# Patient Record
Sex: Female | Born: 1935 | Race: White | Hispanic: No | Smoking: Never smoker
Health system: Southern US, Community
[De-identification: ages and names within clinical notes are randomized; demographics above are authoritative.]

## PROBLEM LIST (undated history)

## (undated) DIAGNOSIS — I10 Essential (primary) hypertension: Secondary | ICD-10-CM

## (undated) DIAGNOSIS — H409 Unspecified glaucoma: Secondary | ICD-10-CM

## (undated) HISTORY — PX: CATARACT EXTRACTION: SUR2

## (undated) HISTORY — PX: APPENDECTOMY: SHX54

## (undated) HISTORY — PX: ESOPHAGOGASTRODUODENOSCOPY (EGD) WITH ESOPHAGEAL DILATION: SHX5812

---

## 2003-11-26 ENCOUNTER — Ambulatory Visit: Payer: Self-pay | Admitting: Family Medicine

## 2004-04-17 ENCOUNTER — Ambulatory Visit: Payer: Self-pay | Admitting: Family Medicine

## 2004-08-31 ENCOUNTER — Ambulatory Visit: Payer: Self-pay | Admitting: Family Medicine

## 2004-11-06 ENCOUNTER — Ambulatory Visit: Payer: Self-pay | Admitting: Family Medicine

## 2004-11-11 ENCOUNTER — Ambulatory Visit: Payer: Self-pay | Admitting: Family Medicine

## 2005-04-13 ENCOUNTER — Ambulatory Visit: Payer: Self-pay | Admitting: Family Medicine

## 2012-04-21 ENCOUNTER — Encounter (HOSPITAL_COMMUNITY): Payer: Self-pay | Admitting: General Practice

## 2012-04-21 ENCOUNTER — Other Ambulatory Visit: Payer: Self-pay

## 2012-04-21 ENCOUNTER — Emergency Department (HOSPITAL_COMMUNITY): Payer: Medicare Other

## 2012-04-21 ENCOUNTER — Inpatient Hospital Stay (HOSPITAL_COMMUNITY): Payer: Medicare Other

## 2012-04-21 ENCOUNTER — Inpatient Hospital Stay (HOSPITAL_COMMUNITY)
Admission: EM | Admit: 2012-04-21 | Discharge: 2012-04-25 | DRG: 065 | Disposition: A | Discharge status: Rehabilitation Facility | Payer: Medicare Other | Attending: Internal Medicine | Admitting: Internal Medicine

## 2012-04-21 DIAGNOSIS — I359 Nonrheumatic aortic valve disorder, unspecified: Secondary | ICD-10-CM

## 2012-04-21 DIAGNOSIS — I4892 Unspecified atrial flutter: Secondary | ICD-10-CM | POA: Diagnosis not present

## 2012-04-21 DIAGNOSIS — I4891 Unspecified atrial fibrillation: Secondary | ICD-10-CM | POA: Diagnosis present

## 2012-04-21 DIAGNOSIS — I6529 Occlusion and stenosis of unspecified carotid artery: Secondary | ICD-10-CM | POA: Diagnosis present

## 2012-04-21 DIAGNOSIS — I671 Cerebral aneurysm, nonruptured: Secondary | ICD-10-CM | POA: Diagnosis present

## 2012-04-21 DIAGNOSIS — R29898 Other symptoms and signs involving the musculoskeletal system: Secondary | ICD-10-CM | POA: Diagnosis present

## 2012-04-21 DIAGNOSIS — I6340 Cerebral infarction due to embolism of unspecified cerebral artery: Principal | ICD-10-CM | POA: Diagnosis present

## 2012-04-21 DIAGNOSIS — H409 Unspecified glaucoma: Secondary | ICD-10-CM | POA: Diagnosis present

## 2012-04-21 DIAGNOSIS — I10 Essential (primary) hypertension: Secondary | ICD-10-CM | POA: Diagnosis present

## 2012-04-21 DIAGNOSIS — I639 Cerebral infarction, unspecified: Secondary | ICD-10-CM

## 2012-04-21 DIAGNOSIS — Z79899 Other long term (current) drug therapy: Secondary | ICD-10-CM

## 2012-04-21 DIAGNOSIS — Z66 Do not resuscitate: Secondary | ICD-10-CM | POA: Diagnosis present

## 2012-04-21 HISTORY — DX: Essential (primary) hypertension: I10

## 2012-04-21 HISTORY — DX: Unspecified glaucoma: H40.9

## 2012-04-21 LAB — CREATININE, SERUM: Creatinine, Ser: 1.03 mg/dL (ref 0.50–1.10)

## 2012-04-21 LAB — POCT I-STAT, CHEM 8
BUN: 22 mg/dL (ref 6–23)
Calcium, Ion: 1.27 mmol/L (ref 1.13–1.30)
Chloride: 100 mEq/L (ref 96–112)
Glucose, Bld: 105 mg/dL — ABNORMAL HIGH (ref 70–99)

## 2012-04-21 LAB — APTT: aPTT: 29 seconds (ref 24–37)

## 2012-04-21 LAB — URINALYSIS, ROUTINE W REFLEX MICROSCOPIC
Glucose, UA: NEGATIVE mg/dL
Hgb urine dipstick: NEGATIVE
Ketones, ur: NEGATIVE mg/dL
Protein, ur: NEGATIVE mg/dL

## 2012-04-21 LAB — COMPREHENSIVE METABOLIC PANEL
ALT: 11 U/L (ref 0–35)
AST: 21 U/L (ref 0–37)
Alkaline Phosphatase: 61 U/L (ref 39–117)
CO2: 26 mEq/L (ref 19–32)
Chloride: 99 mEq/L (ref 96–112)
GFR calc non Af Amer: 44 mL/min — ABNORMAL LOW (ref >90)
Potassium: 3.7 mEq/L (ref 3.5–5.1)
Sodium: 137 mEq/L (ref 135–145)
Total Bilirubin: 0.3 mg/dL (ref 0.3–1.2)

## 2012-04-21 LAB — CBC
HCT: 38.6 % (ref 36.0–46.0)
HCT: 37 % (ref 36.0–46.0)
Hemoglobin: 12.2 g/dL (ref 12.0–15.0)
MCH: 26.9 pg (ref 26.0–34.0)
MCV: 81.7 fL (ref 78.0–100.0)
Platelets: 260 10*3/uL (ref 150–400)
RBC: 4.53 MIL/uL (ref 3.87–5.11)
RDW: 14.2 % (ref 11.5–15.5)
WBC: 6 10*3/uL (ref 4.0–10.5)

## 2012-04-21 LAB — POCT I-STAT TROPONIN I: Troponin i, poc: 0 ng/mL (ref 0.00–0.08)

## 2012-04-21 LAB — DIFFERENTIAL
Basophils Absolute: 0 10*3/uL (ref 0.0–0.1)
Lymphocytes Relative: 17 % (ref 12–46)
Monocytes Absolute: 0.4 10*3/uL (ref 0.1–1.0)
Neutro Abs: 4.3 10*3/uL (ref 1.7–7.7)

## 2012-04-21 LAB — GLUCOSE, CAPILLARY: Glucose-Capillary: 104 mg/dL — ABNORMAL HIGH (ref 70–99)

## 2012-04-21 MED ORDER — LATANOPROST 0.005 % OP SOLN
1.0000 [drp] | Freq: Every day | OPHTHALMIC | Status: DC
  Administered 2012-04-21 – 2012-04-24 (×4): 1 [drp] via OPHTHALMIC
  Filled 2012-04-21: qty 2.5

## 2012-04-21 MED ORDER — DORZOLAMIDE HCL-TIMOLOL MAL 2-0.5 % OP SOLN
1.0000 [drp] | Freq: Two times a day (BID) | OPHTHALMIC | Status: DC
  Administered 2012-04-21 – 2012-04-24 (×6): 1 [drp] via OPHTHALMIC
  Filled 2012-04-21: qty 10

## 2012-04-21 MED ORDER — SENNOSIDES-DOCUSATE SODIUM 8.6-50 MG PO TABS: 1.0000 | ORAL_TABLET | Freq: Every evening | ORAL | Status: DC | PRN

## 2012-04-21 MED ORDER — HYDROCHLOROTHIAZIDE 12.5 MG PO CAPS
12.5000 mg | ORAL_CAPSULE | Freq: Every day | ORAL | Status: DC
  Filled 2012-04-21: qty 1

## 2012-04-21 MED ORDER — CLOPIDOGREL BISULFATE 75 MG PO TABS
75.0000 mg | ORAL_TABLET | Freq: Every day | ORAL | Status: DC
  Administered 2012-04-22 – 2012-04-25 (×4): 75 mg via ORAL
  Filled 2012-04-21 (×5): qty 1

## 2012-04-21 MED ORDER — SIMVASTATIN 20 MG PO TABS
20.0000 mg | ORAL_TABLET | Freq: Every day | ORAL | Status: DC
  Administered 2012-04-21 – 2012-04-24 (×4): 20 mg via ORAL
  Filled 2012-04-21 (×5): qty 1

## 2012-04-21 MED ORDER — HYDRALAZINE HCL 20 MG/ML IJ SOLN: 5.0000 mg | INTRAMUSCULAR | Status: DC | PRN

## 2012-04-21 MED ORDER — VALSARTAN-HYDROCHLOROTHIAZIDE 320-12.5 MG PO TABS: 1.0000 | ORAL_TABLET | Freq: Every day | ORAL | Status: DC

## 2012-04-21 MED ORDER — IRBESARTAN 300 MG PO TABS
300.0000 mg | ORAL_TABLET | Freq: Every day | ORAL | Status: DC
Start: 2012-04-21 — End: 2012-04-25
  Administered 2012-04-21 – 2012-04-25 (×5): 300 mg via ORAL
  Filled 2012-04-21 (×5): qty 1

## 2012-04-21 MED ORDER — IRBESARTAN 300 MG PO TABS
300.0000 mg | ORAL_TABLET | Freq: Every day | ORAL | Status: DC
  Filled 2012-04-21: qty 1

## 2012-04-21 MED ORDER — HYDROCHLOROTHIAZIDE 12.5 MG PO CAPS
12.5000 mg | ORAL_CAPSULE | Freq: Every day | ORAL | Status: DC
Start: 2012-04-21 — End: 2012-04-25
  Administered 2012-04-21 – 2012-04-25 (×5): 12.5 mg via ORAL
  Filled 2012-04-21 (×5): qty 1

## 2012-04-21 MED ORDER — HEPARIN SODIUM (PORCINE) 5000 UNIT/ML IJ SOLN
5000.0000 [IU] | Freq: Three times a day (TID) | INTRAMUSCULAR | Status: DC
  Administered 2012-04-21 – 2012-04-25 (×9): 5000 [IU] via SUBCUTANEOUS
  Filled 2012-04-21 (×15): qty 1

## 2012-04-21 MED ORDER — ADULT MULTIVITAMIN W/MINERALS CH
1.0000 | ORAL_TABLET | Freq: Every day | ORAL | Status: DC
  Administered 2012-04-21 – 2012-04-25 (×4): 1 via ORAL
  Filled 2012-04-21 (×5): qty 1

## 2012-04-21 NOTE — ED Notes (Signed)
EDP at the bedside.  ?

## 2012-04-21 NOTE — ED Notes (Signed)
Per EMS: From home alone, Left arm weakness/numbness. Started 3/26, slight pain and loss of dexterity. Today, went to turn on a light and was unable to lift left arm. No difficulty with speech or walking. Some hx hypertention 185/80, 96% RA, 80bpm, 16resp, CBG 151.

## 2012-04-21 NOTE — ED Notes (Signed)
Patient stated she was not able to urinate at this time. Asked to come back in a little bit.

## 2012-04-21 NOTE — ED Provider Notes (Signed)
History    CSN: [id number] Arrival date & time 04/21/12  4098 First MD Initiated Contact with Patient 04/21/12 [phone]     Chief Complaint  Patient presents with  . Weakness   HPI Pt noticed that her arm started to feel funny last week.  Since then she has felt numbness and weakness in her left arm.  Pt is unable to hold her arm up because it falls right back down.  This AM it became worse.  She had been able to use her arm until this am when she was unable to turn a light on. (Pt is right handed).  No trouble with speech.  No new extremity weakness.  No new headache although she often has a dull headache, (prior history of migraines).   Past Medical History  Diagnosis Date  . Hypertension   . Glaucoma     Past Surgical History  Procedure Laterality Date  . Appendectomy    . Cataract extraction      No family history on file.  History  Substance Use Topics  . Smoking status: Never Smoker   . Smokeless tobacco: Not on file  . Alcohol Use: No    OB History   Grav Para Term Preterm Abortions TAB SAB Ect Mult Living                  Review of Systems  All other systems reviewed and are negative.    Allergies  Penicillins and Sulfa antibiotics  Home Medications   Current Outpatient Rx  Name  Route  Sig  Dispense  Refill  . dorzolamide-timolol (COSOPT) 22.3-6.8 MG/ML ophthalmic solution   Both Eyes   Place 1 drop into both eyes 2 (two) times daily.         Marland Kitchen latanoprost (XALATAN) 0.005 % ophthalmic solution   Both Eyes   Place 1 drop into both eyes at bedtime.         . Multiple Vitamin (MULTIVITAMIN WITH MINERALS) TABS   Oral   Take 1 tablet by mouth daily.         . valsartan-hydrochlorothiazide (DIOVAN-HCT) 320-12.5 MG per tablet   Oral   Take 1 tablet by mouth daily.           BP 155/62  Pulse 82  Temp(Src) 98 F (36.7 C) (Oral)  Resp 22  SpO2 99%  Physical Exam  Nursing note and vitals reviewed. Constitutional: She is oriented to person,  place, and time. She appears well-developed and well-nourished. No distress.  HENT:  Head: Normocephalic and atraumatic.  Right Ear: External ear normal.  Left Ear: External ear normal.  Mouth/Throat: Oropharynx is clear and moist.  Eyes: Conjunctivae are normal. Right eye exhibits no discharge. Left eye exhibits no discharge. No scleral icterus.  Neck: Neck supple. No tracheal deviation present.  Cardiovascular: Normal rate, regular rhythm and intact distal pulses.   Pulmonary/Chest: Effort normal and breath sounds normal. No stridor. No respiratory distress. She has no wheezes. She has no rales.  Abdominal: Soft. Bowel sounds are normal. She exhibits no distension. There is no tenderness. There is no rebound and no guarding.  Musculoskeletal: She exhibits no edema and no tenderness.  Neurological: She is alert and oriented to person, place, and time. She has normal strength. No sensory deficit. Cranial nerve deficit:  no gross defecits noted. She exhibits normal muscle tone. She displays no seizure activity. Coordination normal. GCS eye subscore is 4. GCS verbal subscore is 5. GCS motor subscore  is 6.  Unable to lift left arm and hold against gravity for more than 2 seconds, able to hold both legs off bed for 5 seconds (lthough trouble with right leg due to pain from arthritis, not weakness), sensation intact in all extremities, no visual field cuts, no left or right sided neglect  Skin: Skin is warm and dry. No rash noted.  Psychiatric: She has a normal mood and affect.    ED Course  Procedures (including critical care time) EKG Normal sinus rhythm rate 84 Right bundle branch block No prior EKG for comparison  Labs Reviewed  COMPREHENSIVE METABOLIC PANEL - Abnormal; Notable for the following:    Glucose, Bld 104 (*)    GFR calc non Af Amer 44 (*)    GFR calc Af Amer 51 (*)    All other components within normal limits  GLUCOSE, CAPILLARY - Abnormal; Notable for the following:     Glucose-Capillary 104 (*)    All other components within normal limits  POCT I-STAT, CHEM 8 - Abnormal; Notable for the following:    Glucose, Bld 105 (*)    All other components within normal limits  PROTIME-INR  APTT  CBC  DIFFERENTIAL  TROPONIN I  URINALYSIS, ROUTINE W REFLEX MICROSCOPIC  POCT I-STAT TROPONIN I   Ct Head Wo Contrast  04/21/2012  *RADIOLOGY REPORT*  Clinical Data: Left arm weakness and numbness with pain in the loss of dexterity, symptoms began 04/12/2012, today unable to lift left arm; history hypertension  CT HEAD WITHOUT CONTRAST  Technique:  Contiguous axial images were obtained from the base of the skull through the vertex without contrast.  Comparison: None  Findings: Generalized atrophy. Normal ventricular morphology. No midline shift or mass effect. Tiny sulcal calcifications right parietal, nonspecific. Question small old infarct versus volume averaging artifact at right parietal lobe. Otherwise normal appearance of brain parenchyma. No intracranial hemorrhage, mass lesion or evidence of acute infarction. No extra-axial fluid collections. Visualized paranasal sinuses and mastoid air cells clear. Extensive atherosclerotic calcifications of internal carotid arteries at skull base. No acute osseous findings.  IMPRESSION: Generalized atrophy. Question small old infarct versus volume averaging artifact at right parietal lobe. No acute intracranial abnormalities.   Original Report Authenticated By: Ulyses Southward, M.D.      1. Stroke       MDM  The patient's symptoms are concerning for a stroke affecting her left upper extremity.  She will be admitted to the hospital for further. Patient is not a code stroke/TPA candidate.  The onset of her symptoms started at least yesterday and may have even been going on for the last few days.        Celene Kras, MD 04/21/12 [phone]

## 2012-04-21 NOTE — Progress Notes (Signed)
Bilateral carotid artery duplex completed.  Right:  40-59% internal carotid artery stenosis.  Left:  No evidence of hemodynamically significant internal carotid artery stenosis.  Bilateral:  Vertebral artery flow is antegrade.

## 2012-04-21 NOTE — Progress Notes (Signed)
  Echocardiogram 2D Echocardiogram has been performed.  Sandra Larson 04/21/2012, 3:05 PM

## 2012-04-21 NOTE — H&P (Signed)
[facility] History and Physical  Sandra Larson ZOX:[id number] DOB: 1913-10-19 DOA: 04/21/2012  Referring physician: Dr. Lynelle Doctor PCP: Dina Rich, MD  Specialists: none  Chief Complaint: left arm weakness  HPI: Sandra Larson is a 79 y.o. female  With past medical history of hypertension, she relates she felt her arm felt weakn one week prior to admission progressively getting worse with numbness and dropping things. Patient unable to hold her arm up. She also relays she's been having trouble with buttoning her shirt. She relates no coughing with eating, no slurred speech no other extremity weakness, no bradykinesia no change in vision.  Review of Systems: The patient denies anorexia, fever, weight loss,, vision loss, decreased hearing, hoarseness, chest pain, syncope, dyspnea on exertion, peripheral edema, balance deficits, hemoptysis, abdominal pain, melena, hematochezia, severe indigestion/heartburn, hematuria, incontinence, genital sores, muscle weakness, suspicious skin lesions, transient blindness, difficulty walking, depression, unusual weight change, abnormal bleeding, enlarged lymph nodes, angioedema, and breast masses.    Past Medical History  Diagnosis Date  . Hypertension   . Glaucoma    Past Surgical History  Procedure Laterality Date  . Appendectomy    . Cataract extraction     Social History:  reports that she has never smoked. She does not have any smokeless tobacco history on file. She reports that she does not drink alcohol or use illicit drugs. Visit home alone can perform all her ADLs  Allergies  Allergen Reactions  . Penicillins     chilhood  . Sulfa Antibiotics Rash    Family History  Problem Relation Age of Onset  . Stroke Mother   . Stroke Father      Prior to Admission medications   Medication Sig Start Date End Date Taking? Authorizing Provider  dorzolamide-timolol (COSOPT) 22.3-6.8 MG/ML ophthalmic solution Place 1 drop into both eyes  2 (two) times daily.   Yes Historical Provider, MD  latanoprost (XALATAN) 0.005 % ophthalmic solution Place 1 drop into both eyes at bedtime.   Yes Historical Provider, MD  Multiple Vitamin (MULTIVITAMIN WITH MINERALS) TABS Take 1 tablet by mouth daily.   Yes Historical Provider, MD  valsartan-hydrochlorothiazide (DIOVAN-HCT) 320-12.5 MG per tablet Take 1 tablet by mouth daily.   Yes Historical Provider, MD   Physical Exam: Filed Vitals:   04/21/12 0928 04/21/12 0930 04/21/12 1015 04/21/12 1100  BP:  163/80 155/62 163/86  Pulse:  80 82 54  Temp: 98 F (36.7 C)     TempSrc:      Resp:  24 22 28   SpO2:  100% 99% 99%    BP 163/86  Pulse 54  Temp(Src) 98 F (36.7 C) (Oral)  Resp 28  SpO2 99%  General Appearance:    Alert, cooperative, no distress, appears stated age, extremely thin appearing   Head:    Normocephalic, without obvious abnormality, atraumatic  Eyes:    PERRL, conjunctiva/corneas clear, EOM's intact, fundi    benign, both eyes        Throat:   Lips, mucosa, and tongue normal; teeth and gums normal  Neck:   Supple, symmetrical, trachea midline, no adenopathy;    thyroid:  no enlargement/tenderness/nodules; no carotid   bruit or JVD  Back:     Symmetric, no curvature, ROM normal, no CVA tenderness  Lungs:     Clear to auscultation bilaterally, respirations unlabored  Chest Wall:    No tenderness or deformity   Heart:    Regular rate and rhythm, S1 and S2 normal, no murmur, rub  or gallop     Abdomen:     Soft, non-tender, bowel sounds active all four quadrants,    no masses, no organomegaly        Extremities:   Extremities normal, atraumatic, no cyanosis or edema  Pulses:   2+ and symmetric all extremities  Skin:   Skin color, texture, turgor normal, no rashes or lesions  Lymph nodes:   Cervical, supraclavicular, and axillary nodes normal  Neurologic:  3-12 are grossly intact, sensation is intact throughout except on her left upper extremity she relates  numbness in decrease in sensation to light touch. Her muscle strength in that arm is 3/5. Muscle strength in right upper extremity and bilateral lower extremities is 5/5. Deep tendon reflexes is 2+ finger-to-nose is normal     Labs on Admission:  Basic Metabolic Panel:  Recent Labs Lab 04/21/12 0852 04/21/12 0909  NA 137 140  K 3.7 3.8  CL 99 100  CO2 26  --   GLUCOSE 104* 105*  BUN 21 22  CREATININE 1.04 1.10  CALCIUM 9.7  --    Liver Function Tests:  Recent Labs Lab 04/21/12 0852  AST 21  ALT 11  ALKPHOS 61  BILITOT 0.3  PROT 7.2  ALBUMIN 3.9   No results found for this basename: LIPASE, AMYLASE,  in the last 168 hours No results found for this basename: AMMONIA,  in the last 168 hours CBC:  Recent Labs Lab 04/21/12 0852 04/21/12 0909  WBC 6.0  --   NEUTROABS 4.3  --   HGB 12.2 13.9  HCT 38.6 41.0  MCV 82.1  --   PLT 260  --    Cardiac Enzymes:  Recent Labs Lab 04/21/12 0852  TROPONINI <0.30    BNP (last 3 results) No results found for this basename: PROBNP,  in the last 8760 hours CBG:  Recent Labs Lab 04/21/12 0858  GLUCAP 104*    Radiological Exams on Admission: Ct Head Wo Contrast  04/21/2012  *RADIOLOGY REPORT*  Clinical Data: Left arm weakness and numbness with pain in the loss of dexterity, symptoms began 04/12/2012, today unable to lift left arm; history hypertension  CT HEAD WITHOUT CONTRAST  Technique:  Contiguous axial images were obtained from the base of the skull through the vertex without contrast.  Comparison: None  Findings: Generalized atrophy. Normal ventricular morphology. No midline shift or mass effect. Tiny sulcal calcifications right parietal, nonspecific. Question small old infarct versus volume averaging artifact at right parietal lobe. Otherwise normal appearance of brain parenchyma. No intracranial hemorrhage, mass lesion or evidence of acute infarction. No extra-axial fluid collections. Visualized paranasal sinuses and  mastoid air cells clear. Extensive atherosclerotic calcifications of internal carotid arteries at skull base. No acute osseous findings.  IMPRESSION: Generalized atrophy. Question small old infarct versus volume averaging artifact at right parietal lobe. No acute intracranial abnormalities.   Original Report Authenticated By: Ulyses Southward, M.D.     EKG: Independently reviewed. Sinus tachycardia with right bundle branch block  Assessment/Plan  CVA (cerebral infarction) - HgbA1c, fasting lipid panel  - MRI, MRA of the brain without contrast  - PT consult, OT consult, Speech consult  - Echocardiogram  - Carotid dopplers  - Prophylactic therapy-Antiplatelet med: I would start Plavix as she relates a severe allergy to aspirin. risk factor modification  - Cardiac Monitoring  - Neurochecks q4h  - I will also in the degree of permissive hypertension will hold on her blood pressure medications. Goal less than 180  over    HTN (hypertension): - Hold her Bp medications for today.  Neurology consult  Code Status: full Family Communication: son' s and daughter Disposition Plan: home 2-3 days.  Time spent: 60 minutes  Marinda Elk [facility] Pager [phone]  If 7PM-7AM, please contact night-coverage [email or web address] Password [facility] 04/21/2012, 12:18 PM

## 2012-04-22 DIAGNOSIS — I6350 Cerebral infarction due to unspecified occlusion or stenosis of unspecified cerebral artery: Secondary | ICD-10-CM

## 2012-04-22 DIAGNOSIS — I10 Essential (primary) hypertension: Secondary | ICD-10-CM

## 2012-04-22 LAB — LIPID PANEL
Cholesterol: 159 mg/dL (ref 0–200)
HDL: 46 mg/dL (ref >39)

## 2012-04-22 LAB — HEMOGLOBIN A1C
Hgb A1c MFr Bld: 5.8 % — ABNORMAL HIGH (ref <5.7)
Mean Plasma Glucose: 120 mg/dL — ABNORMAL HIGH (ref <117)

## 2012-04-22 MED ORDER — ALUM & MAG HYDROXIDE-SIMETH 200-200-20 MG/5ML PO SUSP
15.0000 mL | Freq: Four times a day (QID) | ORAL | Status: DC | PRN
  Administered 2012-04-23: 15 mL via ORAL
  Filled 2012-04-22: qty 30

## 2012-04-22 NOTE — Progress Notes (Signed)
OT NOTE  MD- Pt could benefit from splint on Lt wrist. Please order splint to be fabricated/ ordered by OT . (Ot will further assess the proper splint to apply)  Thank you   Lucile Shutters   OTR/L Pager: [phone] Office: [phone] .

## 2012-04-22 NOTE — Consult Note (Signed)
Reason for Consult: Strokes Referring Physician: Dr. Elvera Lennox  CC: Strokes  HPI: Sandra Larson is a pleasant 79 y.o. right-handed female who lives alone in [place].. There are multiple family members who live close by and checks on her frequently. She quit driving several years ago but otherwise has been very independent and continues to cook, clean house, and perform ADLs independently. She uses a rolling walker in her home for ambulation.  Approximately 7-10 days ago the patient noted some mild left upper extremity weakness. This gradually became worse and yesterday she went to turn on the bathroom light switch with her left hand and her arm collapsed into the sink. She presented to [facility] yesterday morning and a CT of the head showed generalized atrophy with a possible old small infarct in the right parietal lobe but no acute intracranial abnormalities. An MRI confirmed multifocal areas of acute cerebral infarction within the right middle cerebral artery territory. She was admitted for further evaluation and treatment. A neurology consult was requested.  The patient denied any weakness of the left leg. She denied any difficulties with speech or swallowing. She denied loss of sensation.  Past Medical History  Diagnosis Date  . Hypertension   . Glaucoma     Past Surgical History  Procedure Laterality Date  . Appendectomy    . Cataract extraction     Previous right patellar surgery.  Family History  Problem Relation Age of Onset  . Stroke Mother   . Stroke Father     Social History:  reports that she has never smoked. She does not have any smokeless tobacco history on file. She reports that she does not drink alcohol or use illicit drugs.  Allergies  Allergen Reactions  . Penicillins     chilhood  . Sulfa Antibiotics Rash    Medications:  Scheduled: . clopidogrel  75 mg Oral Q breakfast  . dorzolamide-timolol  1 drop Both Eyes BID  . heparin   5,000 Units Subcutaneous Q8H  . irbesartan  300 mg Oral Daily   And  . hydrochlorothiazide  12.5 mg Oral Daily  . latanoprost  1 drop Both Eyes QHS  . multivitamin with minerals  1 tablet Oral Daily  . simvastatin  20 mg Oral q1800    ROS: History obtained from the patient and family.  General ROS: negative for - chills, fatigue, fever, night sweats, or weight gain. She has had some slow gradual weight loss. She admits to having a decreased appetite. Psychological ROS: negative for - behavioral disorder, hallucinations, memory difficulties, mood swings or suicidal ideation Ophthalmic ROS: negative for - blurry vision, double vision, eye pain or loss of vision ENT ROS: negative for - epistaxis, nasal discharge, oral lesions, sore throat, or tinnitus. She has had episodes of dizziness which have been attributed to inner ear problems. Allergy and Immunology ROS: negative for - hives or itchy/watery eyes Hematological and Lymphatic ROS: negative for - bleeding problems, bruising or swollen lymph nodes Endocrine ROS: negative for - galactorrhea, hair pattern changes, polydipsia/polyuria or temperature intolerance Respiratory ROS: negative for - cough, hemoptysis, or wheezing. She has occasional shortness of breath related to activity. Cardiovascular ROS: negative for - chest pain, dyspnea on exertion, edema or irregular heartbeat Gastrointestinal ROS: negative for - abdominal pain, diarrhea, hematemesis, nausea/vomiting or stool incontinence. She has occasional indigestion as well as occasional constipation.. Genito-Urinary ROS: negative for - dysuria, hematuria, incontinence or urinary frequency/urgency Musculoskeletal ROS: negative for - joint swelling  or muscular weakness. She has arthritis in her right knee which she attributes to her previous patellar surgery. Neurological ROS: as noted in HPI Dermatological ROS: negative for rash and skin lesion changes  Physical Examination:  Blood  pressure 127/60, pulse 77, temperature 97.6 F (36.4 C), temperature source Oral, resp. rate 20, SpO2 97.00%. General - alert 79 year old female seated in a chair in no acute distress. Heart - Regular rate and rhythm - no murmer Lungs - Clear to auscultation Abdomen - Soft - non tender Extremities - Distal pulses weak but intact - no edema Skin - Warm and dry  Neurologic Examination  Mental Status: Alert, oriented, thought content appropriate.  Speech fluent without evidence of aphasia.  Able to follow 3 step commands without difficulty. Cranial Nerves: II: Discs not visualized; Visual fields grossly normal, pupils equal, round, reactive to light and accommodation III,IV, VI: ptosis not present, extra-ocular motions intact bilaterally V,VII: smile symmetric, facial light touch sensation normal bilaterally VIII: hearing normal bilaterally IX,X: gag reflex present XI: bilateral shoulder shrug XII: midline tongue extension Motor: Right : Upper extremity   5/5    Left:     Upper extremity   3-4/5 proximally 1-2/5 distally  Lower extremity   5/5     Lower extremity   5/5 Tone and bulk:normal tone throughout; no atrophy noted Sensory: Slightly decreased sensation to light touch in the left upper extremity. Deep Tendon Reflexes: 2+ and symmetric throughout Cerebellar: normal finger-to-nose with the right upper extremity. Unable to perform this testing with the left upper extremity secondary to hemiparesis. Essentially normal heel-to-shin testing in both lower extremities. Gait: Not tested CV: pulses palpable throughout    Laboratory Studies:   Basic Metabolic Panel:  Recent Labs Lab 04/21/12 0852 04/21/12 0909 04/21/12 1315  NA 137 140  --   K 3.7 3.8  --   CL 99 100  --   CO2 26  --   --   GLUCOSE 104* 105*  --   BUN 21 22  --   CREATININE 1.04 1.10 1.03  CALCIUM 9.7  --   --     Liver Function Tests:  Recent Labs Lab 04/21/12 0852  AST 21  ALT 11  ALKPHOS 61   BILITOT 0.3  PROT 7.2  ALBUMIN 3.9   No results found for this basename: LIPASE, AMYLASE,  in the last 168 hours No results found for this basename: AMMONIA,  in the last 168 hours  CBC:  Recent Labs Lab 04/21/12 0852 04/21/12 0909 04/21/12 1315  WBC 6.0  --  6.6  NEUTROABS 4.3  --   --   HGB 12.2 13.9 12.2  HCT 38.6 41.0 37.0  MCV 82.1  --  81.7  PLT 260  --  261    Cardiac Enzymes:  Recent Labs Lab 04/21/12 0852  TROPONINI <0.30    BNP: No components found with this basename: POCBNP,   CBG:  Recent Labs Lab 04/21/12 0858  GLUCAP 104*    Microbiology: No results found for this or any previous visit.  Coagulation Studies:  Recent Labs  04/21/12 0852  LABPROT 12.7  INR 0.96    Urinalysis:  Recent Labs Lab 04/21/12 1055  COLORURINE YELLOW  LABSPEC 1.013  PHURINE 7.0  GLUCOSEU NEGATIVE  HGBUR NEGATIVE  BILIRUBINUR NEGATIVE  KETONESUR NEGATIVE  PROTEINUR NEGATIVE  UROBILINOGEN 0.2  NITRITE NEGATIVE  LEUKOCYTESUR NEGATIVE    Lipid Panel:     Component Value Date/Time   CHOL 159 04/22/2012  0540   TRIG 111 04/22/2012 0540   HDL 46 04/22/2012 0540   CHOLHDL 3.5 04/22/2012 0540   VLDL 22 04/22/2012 0540   LDLCALC 91 04/22/2012 0540    HgbA1C:  No results found for this basename: HGBA1C    Urine Drug Screen:   No results found for this basename: labopia, cocainscrnur, labbenz, amphetmu, thcu, labbarb    Alcohol Level: No results found for this basename: ETH,  in the last 168 hours  Other results:  EKG: Sinus rhythm rate 84 beats per minutes.  Imaging:  Dg Chest 2 View 04/21/12  IMPRESSION:  1.  No clear acute findings.  2.  Chronic interstitial lung disease. 3.  Cardiomegaly.    Ct Head Wo Contrast 04/21/12 IMPRESSION: Generalized atrophy. Question small old infarct versus volume averaging artifact at right parietal lobe. No acute intracranial abnormalities.   Mr Brain Wo Contrast 04/21/12 IMPRESSION: Multifocal areas of acute cerebral  infarction as described.  These lie within the right MCA territory, and given their multiplicity and locations, a shower of emboli is not excluded.  Gyriform foci of T1 shortening affects the right occipital lobe without associated cerebral volume loss.  This could represent previous subpial hemorrhage or prior ischemic episode with reperfusion.  There is no frank lobar hemorrhage.  No significant parenchymal volume loss is noted.    MRA HEAD  IMPRESSION: No flow limiting intracranial stenosis.  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  This appears unruptured.    Carotid Dopplers - Right: 40-59% ICA stenosis. ICA/CCA ratio is 1.99. Left: No evidence of significant ICA stenosis. ICA/CCA ratio is 1.46. Bilateral: Vertebral artery flow is antegrade.  2-D echo  - ejection fraction 65-70%. No cardiac source of emboli identified.   Assessment/Plan:   ASSESSMENT Sandra Larson is a 79 y.o. right handed female presenting with left upper extremity hemiparesis.  IV t-PA  not indicated secondary to late presentation.  MRI - Multifocal areas of acute cerebral infarction in the right middle cerebral artery territory.  Infarct felt to be embolic with uncertain etiology.  On no anticoagulation prior to admission. Now on clopidogrel 75 mg orally every day for secondary stroke prevention.   Patient with resultant - left upper extremity hemiparesis.  Therapists - home health or outpatient therapy is recommended.  2D echo -no obvious source of cardiac emboli  Carotid Dopplers -no high-grade stenosis  HgbA1C -pending  Cholesterol - 159              HDL - 91  Risk factors -  hypertension  Hospital day # 1  TREATMENT/PLAN  Continue anticoagulation for secondary stroke prevention.  Pending studies - hemoglobin A1c  Statin - not indicated based on lipid panel.  Risk Factor Modification  Consider TEE  Consider outpatient telemetry monitoring to rule out atrial  fibrillation.  Delton See PA-C [facility] Pager [phone]  I personally participate in this patient's care including formulating the above clinical assessment and management recommendations.  Venetia Maxon M.D. [facility] [phone] 04/22/2012 1:37 PM

## 2012-04-22 NOTE — Progress Notes (Signed)
Orthopedic Tech Progress Note Patient Details:  Sandra Larson December 07, 1913 [id number]  Ortho Devices Type of Ortho Device: Wrist splint Ortho Device/Splint Interventions: Application   Cammer, Mickie Bail 04/22/2012, 1:57 PM

## 2012-04-22 NOTE — Evaluation (Signed)
Physical Therapy Evaluation Patient Details Name: Sandra Larson MRN: [id number] DOB: 20-Mar-1913 Today's Date: 04/22/2012 Time: 4098-1191 PT Time Calculation (min): 30 min  PT Assessment / Plan / Recommendation Clinical Impression  79 yo female with hx of glaucoma now with RT MCA and 4x7 mm ACA aneurysm. PT to follow acutely. Recommend 24/7 (A) at d/c pt unlikely to be able to return to independent living.      PT Assessment  Patient needs continued PT services    Follow Up Recommendations  Home health PT;Supervision/Assistance - 24 hour (Will NEED 24 assist and HHPT in home safety screen)       Barriers to Discharge Decreased caregiver support      Equipment Recommendations  None recommended by PT       Frequency Min 2X/week    Precautions / Restrictions Precautions Precautions: Fall Restrictions Weight Bearing Restrictions: No   Pertinent Vitals/Pain No pain at this time      Mobility  Bed Mobility Bed Mobility: Sitting - Scoot to Edge of Bed;Supine to Sit Supine to Sit: 4: Min assist Sitting - Scoot to Edge of Bed: 4: Min guard Details for Bed Mobility Assistance: v/c for [employer] safety Transfers Transfers: Sit to Stand;Stand to Sit Sit to Stand: 4: Min guard;With upper extremity assist;From bed Stand to Sit: 4: Min assist;With upper extremity assist;To chair/3-in-1 Details for Transfer Assistance: v/c for hand placement and (A) to ensure weight bearing on Lt wrist Ambulation/Gait Ambulation/Gait Assistance: 4: Min guard;4: Min Environmental consultant (Feet): 30 Feet Assistive device: Rolling walker Ambulation/Gait Assistance Details: occassional assist for control of rwpt bumpbing into things, difficult to control rw secondary to inability to use R hand to control Gait Pattern: Step-through pattern;Decreased stride length;Trunk flexed;Narrow base of support Gait velocity: decreased General Gait Details: steady with assist to support hand on rw (R), Max VCs  to attend to R hand placement and positioning; VCs for body positioning within rw Stairs: No Modified Rankin (Stroke Patients Only) Pre-Morbid Rankin Score: Moderate disability Modified Rankin: Moderately severe disability    Exercises Total Joint Exercises Ankle Circles/Pumps: AROM;20 reps   PT Diagnosis: Difficulty walking;Hemiplegia dominant side  PT Problem List: Decreased strength;Decreased activity tolerance;Decreased mobility;Decreased knowledge of use of DME;Decreased safety awareness PT Treatment Interventions: DME instruction;Gait training;Stair training;Functional mobility training;Therapeutic activities;Therapeutic exercise;Balance training;Neuromuscular re-education;Patient/family education   PT Goals Acute Rehab PT Goals PT Goal Formulation: With patient Time For Goal Achievement: 04/29/12 Potential to Achieve Goals: Good Pt will go Supine/Side to Sit: with supervision PT Goal: Supine/Side to Sit - Progress: Goal set today Pt will go Sit to Supine/Side: with supervision PT Goal: Sit to Supine/Side - Progress: Goal set today Pt will go Sit to Stand: with supervision PT Goal: Sit to Stand - Progress: Goal set today Pt will go Stand to Sit: with supervision PT Goal: Stand to Sit - Progress: Goal set today Pt will Ambulate: >150 feet;with supervision PT Goal: Ambulate - Progress: Goal set today Pt will Go Up / Down Stairs: 3-5 stairs;with min assist PT Goal: Up/Down Stairs - Progress: Goal set today  Visit Information  Last PT Received On: 04/22/12 Assistance Needed: +1    Subjective Data  Subjective: I had a stroke Patient Stated Goal: to go home   Prior Functioning  Home Living Lives With: Alone Available Help at Discharge: Family;Available PRN/intermittently Type of Home: House Home Access: Stairs to enter [employer] of Steps: 3 Entrance Stairs-Rails: Left;Right Home Layout: One level Bathroom Shower/Tub: Walk-in shower;Door (  sponge  bath) Bathroom Toilet: Standard Home Adaptive Equipment: Walker - rolling;Straight cane (started using RW few weeks ago, cane previous) Prior Function Level of Independence: Independent Able to Take Stairs?: Yes Driving: No Vocation: Retired Musician: No difficulties Dominant Hand: Right    Cognition  Cognition Overall Cognitive Status: Appears within functional limits for tasks assessed/performed Arousal/Alertness: Awake/alert Orientation Level: Appears intact for tasks assessed Behavior During Session: [facility] for tasks performed Cognition - Other Comments: decr awareness to new deficits    Extremity/Trunk Assessment Right Upper Extremity Assessment RUE ROM/Strength/Tone: [facility] for tasks assessed;Within functional levels RUE Sensation: WFL - Light Touch RUE Coordination: WFL - gross/fine motor Left Upper Extremity Assessment LUE ROM/Strength/Tone: Deficits LUE ROM/Strength/Tone Deficits: Brunstom III- shoulder flexion 3- out 5 , elbow flexion 3 out 5 and wrist extension 1 out 5, pt with support at wrist is able to perform gross grasp LUE Sensation: Deficits LUE Sensation Deficits: pt with light and deep sensation deficits. Pt with lack of aware to position in space. Pt attempting to perform bed mobility with Lt wrist flexed and weight bearing onto wrist joint. Pt educated on the importance of looking at Lt wrist for safe placement LUE Coordination: Deficits Right Lower Extremity Assessment RLE ROM/Strength/Tone: [facility] for tasks assessed;Deficits RLE ROM/Strength/Tone Deficits: generalized weakness RLE Sensation: WFL - Light Touch;WFL - Proprioception RLE Coordination: WFL - gross motor Left Lower Extremity Assessment LLE ROM/Strength/Tone: WFL for tasks assessed;Deficits LLE ROM/Strength/Tone Deficits: generalized weakness LLE Sensation: WFL - Light Touch;WFL - Proprioception LLE Coordination: WFL - gross motor Trunk Assessment Trunk Assessment: Kyphotic    Balance Balance Balance Assessed: Yes High Level Balance High Level Balance Activites: Turns High Level Balance Comments: needs (A) to turn RW  End of Session PT - End of Session Equipment Utilized During Treatment: Gait belt Activity Tolerance: Patient tolerated treatment well Patient left: in chair;with call bell/phone within reach;with family/visitor present Nurse Communication: Mobility status  GP     Fabio Asa 04/22/2012, 11:58 AM  Charlotte Crumb, PT DPT  [phone]

## 2012-04-22 NOTE — Progress Notes (Signed)
[facility] PROGRESS NOTE  Sandra Larson VHQ:[id number] DOB: September 13, 1913 DOA: 04/21/2012 PCP: Dina Rich, MD  Brief Narrative: With past medical history of hypertension, she relates she felt her arm felt weakn one week prior to admission progressively getting worse with numbness and dropping things. Patient unable to hold her arm up. She also relays she's been having trouble with buttoning her shirt. She relates no coughing with eating, no slurred speech no other extremity weakness, no bradykinesia no change in vision.  Assessment/Plan: CVA - MRI with multifocal areas of infarction in the right MCA territory - ACA aneurysm on MRA - 40-60% stenosis on   HTN - Diovan to start tomorrow.    Code Status: DNR Family Communication: none  Disposition Plan: pending Neuro evaluation  Consultants:  Neurology  Procedures:  2D echo Study Conclusions  - Left ventricle: The cavity size was normal. Wall thickness was increased in a pattern of mild LVH. Systolic function was vigorous. The estimated ejection fraction was in the range of 65% to 70%. Wall motion was normal; there were no regional wall motion abnormalities. Doppler parameters are consistent with abnormal left ventricular relaxation (grade 1 diastolic dysfunction). Doppler parameters are consistent with high ventricular filling pressure. - Aortic valve: There was mild stenosis. Trivial regurgitation. Valve area: 1.97cm^2(VTI). Valve area: 1.97cm^2 (Vmax). - Mitral valve: Calcified annulus. Mild regurgitation. - Pulmonary arteries: PA peak pressure: 35mm Hg (S).  Doppler carotid  - final read pending.   Antibiotics:  none  HPI/Subjective: - persistent left sided weakness, feels like her LLE is weaker today  Objective: Filed Vitals:   04/21/12 2058 04/21/12 2330 04/22/12 0130 04/22/12 0700  BP: 124/57 151/86 162/71 146/80  Pulse: 76 79 73 80  Temp: 98.2 F (36.8 C) 97.4 F (36.3 C) 97.4 F (36.3 C)  98.3 F (36.8 C)  TempSrc: Oral Oral Oral Oral  Resp: 18 18 20 18   SpO2: 97% 95% 97% 97%    Intake/Output Summary (Last 24 hours) at 04/22/12 0824 Last data filed at 04/21/12 1753  Gross per 24 hour  Intake    240 ml  Output      0 ml  Net    240 ml    Exam:  General:  NAD  Cardiovascular: regular rate and rhythm, without MRG  Respiratory: good air movement, clear to auscultation throughout, no wheezing, ronchi or rales  Abdomen: soft, not tender to palpation, positive bowel sounds  MSK: no peripheral edema  Neuro: CN 2-12 grossly intact, 5-/5 strength LUE and LLE  Data Reviewed: Basic Metabolic Panel:  Recent Labs Lab 04/21/12 0852 04/21/12 0909 04/21/12 1315  NA 137 140  --   K 3.7 3.8  --   CL 99 100  --   CO2 26  --   --   GLUCOSE 104* 105*  --   BUN 21 22  --   CREATININE 1.04 1.10 1.03  CALCIUM 9.7  --   --    Liver Function Tests:  Recent Labs Lab 04/21/12 0852  AST 21  ALT 11  ALKPHOS 61  BILITOT 0.3  PROT 7.2  ALBUMIN 3.9   CBC:  Recent Labs Lab 04/21/12 0852 04/21/12 0909 04/21/12 1315  WBC 6.0  --  6.6  NEUTROABS 4.3  --   --   HGB 12.2 13.9 12.2  HCT 38.6 41.0 37.0  MCV 82.1  --  81.7  PLT 260  --  261   Cardiac Enzymes:  Recent Labs Lab 04/21/12 0852  TROPONINI <  0.30   CBG:  Recent Labs Lab 04/21/12 0858  GLUCAP 104*    Studies: Dg Chest 2 View  04/21/2012  *RADIOLOGY REPORT*  Clinical Data: Stroke  CHEST - 2 VIEW  Comparison: None.  Findings: The cardiac silhouette is enlarged.  There is kyphosis and scoliosis of the spine.  There are low lung volumes.  No effusion, infiltrate, or pneumothorax.  There is underlying chronic interstitial lung disease.  IMPRESSION:  1.  No clear acute findings.  2.  Chronic interstitial lung disease. 3.  Cardiomegaly.   Original Report Authenticated By: Genevive Bi, M.D.    Ct Head Wo Contrast  04/21/2012  *RADIOLOGY REPORT*  Clinical Data: Left arm weakness and numbness with  pain in the loss of dexterity, symptoms began 04/12/2012, today unable to lift left arm; history hypertension  CT HEAD WITHOUT CONTRAST  Technique:  Contiguous axial images were obtained from the base of the skull through the vertex without contrast.  Comparison: None  Findings: Generalized atrophy. Normal ventricular morphology. No midline shift or mass effect. Tiny sulcal calcifications right parietal, nonspecific. Question small old infarct versus volume averaging artifact at right parietal lobe. Otherwise normal appearance of brain parenchyma. No intracranial hemorrhage, mass lesion or evidence of acute infarction. No extra-axial fluid collections. Visualized paranasal sinuses and mastoid air cells clear. Extensive atherosclerotic calcifications of internal carotid arteries at skull base. No acute osseous findings.  IMPRESSION: Generalized atrophy. Question small old infarct versus volume averaging artifact at right parietal lobe. No acute intracranial abnormalities.   Original Report Authenticated By: Ulyses Southward, M.D.    Mr Brain Wo Contrast  04/21/2012  *RADIOLOGY REPORT*  Clinical Data:  Left arm weakness.  Stroke risk factors include previous history of stroke and hypertension.  MRI HEAD WITHOUT CONTRAST MRA HEAD WITHOUT CONTRAST  Technique:  Multiplanar, multiecho pulse sequences of the brain and surrounding structures were obtained without intravenous contrast. Angiographic images of the head were obtained using MRA technique without contrast.  Comparison:  CT head earlier in the day.  MRI HEAD  Findings:  Multifocal areas of acute infarction affect the right frontal cortex, right posterior frontal cortex, right parietal cortex, and adjacent subcortical white matter.  There is no visible associated hemorrhage.  Moderate age related atrophy is noted.  Chronic microvascular ischemic change affects the periventricular greater than subcortical white matter.  No significant chronic large vessel infarct.   Gyriform T1 shortening in the right occipital lobe could represent a prior post traumatic subpial hemorrhage, or prior ischemic insult with reperfusion.  No significant encephalomalacia.  No midline shift.  Dolichoectatic cerebral vasculature. Bulbous ACA abnormality, described below.  Pituitary and cerebellar tonsils unremarkable.  Mild cervical spondylosis.  No worrisome osseous lesions.  Clear sinuses and mastoids.  IMPRESSION: Multifocal areas of acute cerebral infarction as described.  These lie within the right MCA territory, and given their multiplicity and locations, a shower of emboli is not excluded.  Gyriform foci of T1 shortening affects the right occipital lobe without associated cerebral volume loss.  This could represent previous subpial hemorrhage or prior ischemic episode with reperfusion.  There is no frank lobar hemorrhage.  No significant parenchymal volume loss is noted.  MRA HEAD  Findings: The internal carotid arteries are widely patent.  The basilar artery is widely patent left vertebral dominant.  Right vertebral is patent.  No proximal MCA stenosis is seen.  Both anterior cerebral arteries are patent.  There is mild nonstenotic irregularity both posterior cerebrals.  No cerebellar branch  occlusion is present.  There is a 4 x 7 mm anterior communicating artery aneurysm at the junction of the left A1 and ACA.  This appears unruptured.  The aneurysm is wide necked.  IMPRESSION: No flow limiting intracranial stenosis.  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  This appears unruptured.   Original Report Authenticated By: Davonna Belling, M.D.    Mr Mra Head/brain Wo Cm  04/21/2012  *RADIOLOGY REPORT*  Clinical Data:  Left arm weakness.  Stroke risk factors include previous history of stroke and hypertension.  MRI HEAD WITHOUT CONTRAST MRA HEAD WITHOUT CONTRAST  Technique:  Multiplanar, multiecho pulse sequences of the brain and surrounding structures were obtained without intravenous  contrast. Angiographic images of the head were obtained using MRA technique without contrast.  Comparison:  CT head earlier in the day.  MRI HEAD  Findings:  Multifocal areas of acute infarction affect the right frontal cortex, right posterior frontal cortex, right parietal cortex, and adjacent subcortical white matter.  There is no visible associated hemorrhage.  Moderate age related atrophy is noted.  Chronic microvascular ischemic change affects the periventricular greater than subcortical white matter.  No significant chronic large vessel infarct.  Gyriform T1 shortening in the right occipital lobe could represent a prior post traumatic subpial hemorrhage, or prior ischemic insult with reperfusion.  No significant encephalomalacia.  No midline shift.  Dolichoectatic cerebral vasculature. Bulbous ACA abnormality, described below.  Pituitary and cerebellar tonsils unremarkable.  Mild cervical spondylosis.  No worrisome osseous lesions.  Clear sinuses and mastoids.  IMPRESSION: Multifocal areas of acute cerebral infarction as described.  These lie within the right MCA territory, and given their multiplicity and locations, a shower of emboli is not excluded.  Gyriform foci of T1 shortening affects the right occipital lobe without associated cerebral volume loss.  This could represent previous subpial hemorrhage or prior ischemic episode with reperfusion.  There is no frank lobar hemorrhage.  No significant parenchymal volume loss is noted.  MRA HEAD  Findings: The internal carotid arteries are widely patent.  The basilar artery is widely patent left vertebral dominant.  Right vertebral is patent.  No proximal MCA stenosis is seen.  Both anterior cerebral arteries are patent.  There is mild nonstenotic irregularity both posterior cerebrals.  No cerebellar branch occlusion is present.  There is a 4 x 7 mm anterior communicating artery aneurysm at the junction of the left A1 and ACA.  This appears unruptured.  The  aneurysm is wide necked.  IMPRESSION: No flow limiting intracranial stenosis.  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  This appears unruptured.   Original Report Authenticated By: Davonna Belling, M.D.     Scheduled Meds: . clopidogrel  75 mg Oral Q breakfast  . dorzolamide-timolol  1 drop Both Eyes BID  . heparin  5,000 Units Subcutaneous Q8H  . irbesartan  300 mg Oral Daily   And  . hydrochlorothiazide  12.5 mg Oral Daily  . latanoprost  1 drop Both Eyes QHS  . multivitamin with minerals  1 tablet Oral Daily  . simvastatin  20 mg Oral q1800   Continuous Infusions:   Active Problems:   CVA (cerebral infarction)   HTN (hypertension)  Time spent: 30  Pamella Pert, MD [facility] Pager [phone]. If 7 PM - 7 AM, please contact night-coverage at [email or web address], password [facility] 04/22/2012, 8:24 AM  LOS: 1 day

## 2012-04-22 NOTE — Evaluation (Signed)
Occupational Therapy Evaluation Patient Details Name: Sandra Larson MRN: [id number] DOB: 1913/04/17 Today's Date: 04/22/2012 Time: 7829-5621 OT Time Calculation (min): 30 min  OT Assessment / Plan / Recommendation Clinical Impression  79 yo female with hx of glaucoma now with RT MCA and 4x7 mm ACA aneurysm. Ot to follow acutely. Recommend 24/7 (A) at d/c pt unlikely to be able to return to independent living. Family live nearby but uncertain of level of care able to provide. Recommend at this time splinting Lt hand and outpatient follow up.    OT Assessment  Patient needs continued OT Services    Follow Up Recommendations  Outpatient OT    Barriers to Discharge Decreased caregiver support    Equipment Recommendations  None recommended by OT    Recommendations for Other Services    Frequency  Min 3X/week    Precautions / Restrictions Precautions Precautions: Fall Restrictions Weight Bearing Restrictions: No   Pertinent Vitals/Pain No pain reported    ADL  Grooming: Minimal assistance;Teeth care Where Assessed - Grooming: Unsupported standing (required support at wrist to utilize Lt UE) Toilet Transfer: Moderate assistance Toilet Transfer Method: Sit to stand Toilet Transfer Equipment: Regular height toilet;Grab bars Toileting - Clothing Manipulation and Hygiene: Moderate assistance Where Assessed - Toileting Clothing Manipulation and Hygiene: Sit to stand from 3-in-1 or toilet (deficits with pulling up Lt side of underwear) Equipment Used: Gait belt;Rolling walker Transfers/Ambulation Related to ADLs: Pt ambulating with lt side of walker bumping into environmental supports and decr grasp on RW lt handle ADL Comments: Pt demonstrates wrist extension deficits. Pt demonstrates decr awareness of Lt Ue deficits. Pt with sensation deficits and reports none initially. Pt demonstrates visual deficits and has glaucoma at baseline. Pt reports everything as hazy.    OT Diagnosis:  Cognitive deficits;Disturbance of vision  OT Problem List: Decreased strength;Decreased activity tolerance;Impaired balance (sitting and/or standing);Impaired vision/perception;Decreased cognition;Decreased safety awareness;Decreased knowledge of use of DME or AE;Decreased knowledge of precautions;Impaired UE functional use;Impaired sensation OT Treatment Interventions: Self-care/ADL training;DME and/or AE instruction;Therapeutic activities;Cognitive remediation/compensation;Visual/perceptual remediation/compensation;Patient/family education;Balance training;Splinting;Neuromuscular education   OT Goals Acute Rehab OT Goals OT Goal Formulation: With patient/family Time For Goal Achievement: 05/06/12 Potential to Achieve Goals: Good ADL Goals Pt Will Perform Grooming: with set-up;Standing at sink;Other (comment) (splint for wrist stability) ADL Goal: Grooming - Progress: Goal set today Pt Will Perform Upper Body Bathing: with set-up;Standing at sink;Unsupported ADL Goal: Upper Body Bathing - Progress: Goal set today Pt Will Perform Upper Body Dressing: with set-up;Sit to stand from chair ADL Goal: Upper Body Dressing - Progress: Goal set today Pt Will Transfer to Toilet: with modified independence;Ambulation;Regular height toilet ADL Goal: Toilet Transfer - Progress: Goal set today Pt Will Perform Toileting - Clothing Manipulation: with modified independence;Standing ADL Goal: Toileting - Clothing Manipulation - Progress: Goal set today Pt Will Perform Toileting - Hygiene: with modified independence;Sit to stand from 3-in-1/toilet ADL Goal: Toileting - Hygiene - Progress: Goal set today Miscellaneous OT Goals Miscellaneous OT Goal #1: pt will complete bed mobility MOD I with attention to LT hand placement for safety OT Goal: Miscellaneous Goal #1 - Progress: Goal set today Miscellaneous OT Goal #2: Pt will tolerate splint with no signs of skin break down and follow splint schedule MOD I OT  Goal: Miscellaneous Goal #2 - Progress: Goal set today  Visit Information  Last OT Received On: 04/22/12 Assistance Needed: +1 PT/OT Co-Evaluation/Treatment: Yes    Subjective Data  Subjective: i had a stroke Patient Stated Goal: to  go home   Prior Functioning     Home Living Lives With: Alone Available Help at Discharge: Family;Available PRN/intermittently Type of Home: House Home Access: Stairs to enter [employer] of Steps: 3 Entrance Stairs-Rails: Left;Right Home Layout: One level Bathroom Shower/Tub: Walk-in shower;Door (sponge bath) Bathroom Toilet: Standard Home Adaptive Equipment: Walker - rolling;Straight cane (started using RW few weeks ago, cane previous) Prior Function Level of Independence: Independent Able to Take Stairs?: Yes Driving: No Vocation: Retired Musician: No difficulties Dominant Hand: Right         Vision/Perception Vision - History Baseline Vision: Wears glasses all the time Visual History: Glaucoma Patient Visual Report:  (reports no changes ) Vision - Assessment Eye Alignment: Within Functional Limits Vision Assessment: Vision tested Ocular Range of Motion: Within Functional Limits Tracking/Visual Pursuits: Decreased smoothness of eye movement to RIGHT superior field;Decreased smoothness of eye movement to LEFT superior field Convergence: Impaired (comment) Additional Comments: Pt reports that things are hazy   Cognition  Cognition Overall Cognitive Status: Appears within functional limits for tasks assessed/performed Arousal/Alertness: Awake/alert Orientation Level: Appears intact for tasks assessed Behavior During Session: [facility] for tasks performed    Extremity/Trunk Assessment Right Upper Extremity Assessment RUE ROM/Strength/Tone: [facility] for tasks assessed;Within functional levels RUE Sensation: WFL - Light Touch RUE Coordination: WFL - gross/fine motor Left Upper Extremity Assessment LUE  ROM/Strength/Tone: Deficits LUE ROM/Strength/Tone Deficits: Brunstom III- shoulder flexion 3- out 5 , elbow flexion 3 out 5 and wrist extension 1 out 5, pt with support at wrist is able to perform gross grasp LUE Sensation: Deficits LUE Sensation Deficits: pt with light and deep sensation deficits. Pt with lack of aware to position in space. Pt attempting to perform bed mobility with Lt wrist flexed and weight bearing onto wrist joint. Pt educated on the importance of looking at Lt wrist for safe placement LUE Coordination: Deficits Trunk Assessment Trunk Assessment: Kyphotic     Mobility Bed Mobility Bed Mobility: Sitting - Scoot to Edge of Bed;Supine to Sit Supine to Sit: 4: Min assist Sitting - Scoot to Edge of Bed: 4: Min guard Details for Bed Mobility Assistance: v/c for [employer] safety Transfers Transfers: Sit to Stand;Stand to Sit Sit to Stand: 4: Min guard;With upper extremity assist;From bed Stand to Sit: 4: Min assist;With upper extremity assist;To chair/3-in-1 Details for Transfer Assistance: v/c for hand placement and (A) to ensure weight bearing on Lt wrist     Exercise     Balance Balance Balance Assessed: Yes High Level Balance High Level Balance Activites: Turns High Level Balance Comments: needs (A) to turn RW   End of Session OT - End of Session Activity Tolerance: Patient tolerated treatment well Patient left: in chair;with call bell/phone within reach;with family/visitor present Nurse Communication: Mobility status;Precautions  GO     Lucile Shutters 04/22/2012, 11:25 AM Pager: [phone]

## 2012-04-23 MED ORDER — ONDANSETRON HCL 4 MG PO TABS: 8.0000 mg | ORAL_TABLET | Freq: Three times a day (TID) | ORAL | Status: DC | PRN

## 2012-04-23 NOTE — Progress Notes (Signed)
Occupational Therapy Treatment Patient Details Name: Sandra Larson MRN: [id number] DOB: 06-26-1913 Today's Date: 04/23/2012 Time: 4098-1191 OT Time Calculation (min): 23 min  OT Assessment / Plan / Recommendation Comments on Treatment Session Pt progressing toward goals.  Pt wearing left resting hand splint and educated pt and family to remove splint if swelling, redness or pain occurs.  Family reporting today that they will not be able to provide 24/7 assist to pt at d/c.  Will update d/c plan for SNF.    Follow Up Recommendations  SNF    Barriers to Discharge       Equipment Recommendations  None recommended by OT    Recommendations for Other Services    Frequency Min 3X/week   Plan Discharge plan needs to be updated    Precautions / Restrictions Precautions Precautions: Fall   Pertinent Vitals/Pain See vitals    ADL  Grooming: Performed;Wash/dry hands;Minimal assistance Where Assessed - Grooming: Unsupported standing Toilet Transfer: Simulated;Moderate assistance Toilet Transfer Method: Sit to Barista:  (chair) Equipment Used: Rolling walker Transfers/Ambulation Related to ADLs: Mod assist with RW. Pt requires VCs to avoid bumping into surroundings on left side.  Assist to keep Left hand on RW. ADL Comments: Pt with left resting hand splint in room (delivered by ortho tech).  Pt not wearing splint on arrival.  Reports MD took splint off earlier this AM due to pt c/o discomfort and finger swelling.  Pt reports she felt like the splint was too tight.  Reapplied splint but ensured proper fit (was able to fit 1-2 fingers between splint and pt arm) and educated pt, RN, and pt's family to remove splint if swelling,redness or discomfort occur again.  Pt and family verbalized understanding.  Pt with improved wrist extension today (20-30 degrees). Family present and report they will not be able to provide 24/7 assist for pt at home.    OT Diagnosis:    OT  Problem List:   OT Treatment Interventions:     OT Goals ADL Goals Pt Will Perform Grooming: with set-up;Standing at sink;Other (comment) ADL Goal: Grooming - Progress: Progressing toward goals Pt Will Transfer to Toilet: with modified independence;Ambulation;Regular height toilet ADL Goal: Toilet Transfer - Progress: Progressing toward goals Miscellaneous OT Goals Miscellaneous OT Goal #2: Pt will tolerate splint with no signs of skin break down and follow splint schedule MOD I OT Goal: Miscellaneous Goal #2 - Progress: Progressing toward goals  Visit Information  Last OT Received On: 04/23/12 Assistance Needed: +1    Subjective Data      Prior Functioning       Cognition  Cognition Overall Cognitive Status: Appears within functional limits for tasks assessed/performed Arousal/Alertness: Awake/alert Orientation Level: Appears intact for tasks assessed Behavior During Session: [facility] for tasks performed Cognition - Other Comments: decr awareness to new deficits    Mobility  Bed Mobility Bed Mobility: Not assessed (pt up in chair) Transfers Transfers: Sit to Stand;Stand to Sit Sit to Stand: 4: Min assist;From chair/3-in-1;With armrests;With upper extremity assist Stand to Sit: 4: Min assist;To chair/3-in-1;With armrests;With upper extremity assist Details for Transfer Assistance: VCs for safe hand placement.    Exercises      Balance     End of Session OT - End of Session Equipment Utilized During Treatment: Gait belt Activity Tolerance: Patient tolerated treatment well Patient left: in chair;with call bell/phone within reach;with family/visitor present Nurse Communication: Mobility status  GO    04/23/2012 Cipriano Mile OTR/L Pager  [phone] Office [phone]  Cipriano Mile 04/23/2012, 1:02 PM

## 2012-04-23 NOTE — Progress Notes (Signed)
[facility] PROGRESS NOTE  ADISYNN SULEIMAN ZOX:[id number] DOB: September 01, 1913 DOA: 04/21/2012 PCP: Dina Rich, MD  Brief Narrative: With past medical history of hypertension, she relates she felt her arm felt weakn one week prior to admission progressively getting worse with numbness and dropping things. Patient unable to hold her arm up. She also relays she's been having trouble with buttoning her shirt. She relates no coughing with eating, no slurred speech no other extremity weakness, no bradykinesia no change in vision.  Assessment/Plan: CVA - MRI with multifocal areas of infarction in the right MCA territory - ACA aneurysm on MRA - 40-60% stenosis on R ICA, - 2D echo with good systolic function.  - PT recommended - plan for TEE tomorrow. She is very functional at baseline, is independent, performs all of her ADLs and would be a reasonable candidate for anticoagulation if TEE is positive.  - on Plavix  HTN - on Diovan, monitoring.   Code Status: DNR Family Communication: daughter in the room (can be also reached at [phone]) Disposition Plan: pending Neuro evaluation  Consultants:  Neurology  Procedures:  2D echo Study Conclusions  - Left ventricle: The cavity size was normal. Wall thickness was increased in a pattern of mild LVH. Systolic function was vigorous. The estimated ejection fraction was in the range of 65% to 70%. Wall motion was normal; there were no regional wall motion abnormalities. Doppler parameters are consistent with abnormal left ventricular relaxation (grade 1 diastolic dysfunction). Doppler parameters are consistent with high ventricular filling pressure. - Aortic valve: There was mild stenosis. Trivial regurgitation. Valve area: 1.97cm^2(VTI). Valve area: 1.97cm^2 (Vmax). - Mitral valve: Calcified annulus. Mild regurgitation. - Pulmonary arteries: PA peak pressure: 35mm Hg (S).  Doppler carotid  Right: 40-59% ICA stenosis. ICA/CCA  ratio is 1.99. Left: No evidence of significant ICA stenosis. ICA/CCA ratio is 1.46. Bilateral: Vertebral artery flow is antegrade.  Antibiotics:  none  HPI/Subjective: - persistent left sided weakness in left hand  Objective: Filed Vitals:   04/22/12 1903 04/22/12 2100 04/22/12 2205 04/23/12 0552  BP: 112/45  126/53 107/84  Pulse: 93  75 77  Temp: 97.7 F (36.5 C)  98 F (36.7 C) 97.9 F (36.6 C)  TempSrc: Oral  Oral Oral  Resp: 20  18 18   Height:  5\' 5"  (1.651 m)    Weight:  54.432 kg (120 lb)    SpO2: 94%  97% 96%    Intake/Output Summary (Last 24 hours) at 04/23/12 0910 Last data filed at 04/23/12 0740  Gross per 24 hour  Intake    480 ml  Output      3 ml  Net    477 ml    Exam:  General:  NAD  Cardiovascular: regular rate and rhythm, without MRG  Respiratory: good air movement, clear to auscultation throughout, no wheezing, ronchi or rales  Abdomen: soft, not tender to palpation, positive bowel sounds  MSK: no peripheral edema  Neuro: CN 2-12 grossly intact, 5-/5 strength LUE and LLE  Data Reviewed: Basic Metabolic Panel:  Recent Labs Lab 04/21/12 0852 04/21/12 0909 04/21/12 1315  NA 137 140  --   K 3.7 3.8  --   CL 99 100  --   CO2 26  --   --   GLUCOSE 104* 105*  --   BUN 21 22  --   CREATININE 1.04 1.10 1.03  CALCIUM 9.7  --   --    Liver Function Tests:  Recent Labs Lab  04/21/12 0852  AST 21  ALT 11  ALKPHOS 61  BILITOT 0.3  PROT 7.2  ALBUMIN 3.9   CBC:  Recent Labs Lab 04/21/12 0852 04/21/12 0909 04/21/12 1315  WBC 6.0  --  6.6  NEUTROABS 4.3  --   --   HGB 12.2 13.9 12.2  HCT 38.6 41.0 37.0  MCV 82.1  --  81.7  PLT 260  --  261   Cardiac Enzymes:  Recent Labs Lab 04/21/12 0852  TROPONINI <0.30   CBG:  Recent Labs Lab 04/21/12 0858  GLUCAP 104*    Studies: Dg Chest 2 View  04/21/2012  *RADIOLOGY REPORT*  Clinical Data: Stroke  CHEST - 2 VIEW  Comparison: None.  Findings: The cardiac silhouette is  enlarged.  There is kyphosis and scoliosis of the spine.  There are low lung volumes.  No effusion, infiltrate, or pneumothorax.  There is underlying chronic interstitial lung disease.  IMPRESSION:  1.  No clear acute findings.  2.  Chronic interstitial lung disease. 3.  Cardiomegaly.   Original Report Authenticated By: Genevive Bi, M.D.    Ct Head Wo Contrast  04/21/2012  *RADIOLOGY REPORT*  Clinical Data: Left arm weakness and numbness with pain in the loss of dexterity, symptoms began 04/12/2012, today unable to lift left arm; history hypertension  CT HEAD WITHOUT CONTRAST  Technique:  Contiguous axial images were obtained from the base of the skull through the vertex without contrast.  Comparison: None  Findings: Generalized atrophy. Normal ventricular morphology. No midline shift or mass effect. Tiny sulcal calcifications right parietal, nonspecific. Question small old infarct versus volume averaging artifact at right parietal lobe. Otherwise normal appearance of brain parenchyma. No intracranial hemorrhage, mass lesion or evidence of acute infarction. No extra-axial fluid collections. Visualized paranasal sinuses and mastoid air cells clear. Extensive atherosclerotic calcifications of internal carotid arteries at skull base. No acute osseous findings.  IMPRESSION: Generalized atrophy. Question small old infarct versus volume averaging artifact at right parietal lobe. No acute intracranial abnormalities.   Original Report Authenticated By: Ulyses Southward, M.D.    Mr Brain Wo Contrast  04/21/2012  *RADIOLOGY REPORT*  Clinical Data:  Left arm weakness.  Stroke risk factors include previous history of stroke and hypertension.  MRI HEAD WITHOUT CONTRAST MRA HEAD WITHOUT CONTRAST  Technique:  Multiplanar, multiecho pulse sequences of the brain and surrounding structures were obtained without intravenous contrast. Angiographic images of the head were obtained using MRA technique without contrast.  Comparison:  CT  head earlier in the day.  MRI HEAD  Findings:  Multifocal areas of acute infarction affect the right frontal cortex, right posterior frontal cortex, right parietal cortex, and adjacent subcortical white matter.  There is no visible associated hemorrhage.  Moderate age related atrophy is noted.  Chronic microvascular ischemic change affects the periventricular greater than subcortical white matter.  No significant chronic large vessel infarct.  Gyriform T1 shortening in the right occipital lobe could represent a prior post traumatic subpial hemorrhage, or prior ischemic insult with reperfusion.  No significant encephalomalacia.  No midline shift.  Dolichoectatic cerebral vasculature. Bulbous ACA abnormality, described below.  Pituitary and cerebellar tonsils unremarkable.  Mild cervical spondylosis.  No worrisome osseous lesions.  Clear sinuses and mastoids.  IMPRESSION: Multifocal areas of acute cerebral infarction as described.  These lie within the right MCA territory, and given their multiplicity and locations, a shower of emboli is not excluded.  Gyriform foci of T1 shortening affects the right occipital lobe without associated  cerebral volume loss.  This could represent previous subpial hemorrhage or prior ischemic episode with reperfusion.  There is no frank lobar hemorrhage.  No significant parenchymal volume loss is noted.  MRA HEAD  Findings: The internal carotid arteries are widely patent.  The basilar artery is widely patent left vertebral dominant.  Right vertebral is patent.  No proximal MCA stenosis is seen.  Both anterior cerebral arteries are patent.  There is mild nonstenotic irregularity both posterior cerebrals.  No cerebellar branch occlusion is present.  There is a 4 x 7 mm anterior communicating artery aneurysm at the junction of the left A1 and ACA.  This appears unruptured.  The aneurysm is wide necked.  IMPRESSION: No flow limiting intracranial stenosis.  4 x 7 mm anterior communicating  artery aneurysm.  This appears unruptured.   Original Report Authenticated By: Davonna Belling, M.D.    Mr Mra Head/brain Wo Cm  04/21/2012  *RADIOLOGY REPORT*  Clinical Data:  Left arm weakness.  Stroke risk factors include previous history of stroke and hypertension.  MRI HEAD WITHOUT CONTRAST MRA HEAD WITHOUT CONTRAST  Technique:  Multiplanar, multiecho pulse sequences of the brain and surrounding structures were obtained without intravenous contrast. Angiographic images of the head were obtained using MRA technique without contrast.  Comparison:  CT head earlier in the day.  MRI HEAD  Findings:  Multifocal areas of acute infarction affect the right frontal cortex, right posterior frontal cortex, right parietal cortex, and adjacent subcortical white matter.  There is no visible associated hemorrhage.  Moderate age related atrophy is noted.  Chronic microvascular ischemic change affects the periventricular greater than subcortical white matter.  No significant chronic large vessel infarct.  Gyriform T1 shortening in the right occipital lobe could represent a prior post traumatic subpial hemorrhage, or prior ischemic insult with reperfusion.  No significant encephalomalacia.  No midline shift.  Dolichoectatic cerebral vasculature. Bulbous ACA abnormality, described below.  Pituitary and cerebellar tonsils unremarkable.  Mild cervical spondylosis.  No worrisome osseous lesions.  Clear sinuses and mastoids.  IMPRESSION: Multifocal areas of acute cerebral infarction as described.  These lie within the right MCA territory, and given their multiplicity and locations, a shower of emboli is not excluded.  Gyriform foci of T1 shortening affects the right occipital lobe without associated cerebral volume loss.  This could represent previous subpial hemorrhage or prior ischemic episode with reperfusion.  There is no frank lobar hemorrhage.  No significant parenchymal volume loss is noted.  MRA HEAD  Findings: The internal  carotid arteries are widely patent.  The basilar artery is widely patent left vertebral dominant.  Right vertebral is patent.  No proximal MCA stenosis is seen.  Both anterior cerebral arteries are patent.  There is mild nonstenotic irregularity both posterior cerebrals.  No cerebellar branch occlusion is present.  There is a 4 x 7 mm anterior communicating artery aneurysm at the junction of the left A1 and ACA.  This appears unruptured.  The aneurysm is wide necked.  IMPRESSION: No flow limiting intracranial stenosis.  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  This appears unruptured.   Original Report Authenticated By: Davonna Belling, M.D.     Scheduled Meds: . clopidogrel  75 mg Oral Q breakfast  . dorzolamide-timolol  1 drop Both Eyes BID  . heparin  5,000 Units Subcutaneous Q8H  . irbesartan  300 mg Oral Daily   And  . hydrochlorothiazide  12.5 mg Oral Daily  . latanoprost  1 drop Both  Eyes QHS  . multivitamin with minerals  1 tablet Oral Daily  . simvastatin  20 mg Oral q1800   Continuous Infusions:   Active Problems:   CVA (cerebral infarction)   HTN (hypertension)  Time spent: 25  Pamella Pert, MD [facility] Pager [phone]. If 7 PM - 7 AM, please contact night-coverage at [email or web address], password [email or web address] 04/23/2012, 9:10 AM  LOS: 2 days

## 2012-04-23 NOTE — Progress Notes (Signed)
Stroke Team Progress Note  HISTORY Sandra Larson is a pleasant 79 y.o. right-handed female who lives alone in [place].. There are multiple family members who live close by and checks on her frequently. She quit driving several years ago but otherwise has been very independent and continues to cook, clean house, and perform ADLs independently. She uses a rolling walker in her home for ambulation.  Approximately 7-10 days ago the patient noted some mild left upper extremity weakness. This gradually progressed and yesterday she went to turn on the bathroom light switch with her left hand and her arm collapsed into the sink. She presented to [facility]12 and a CT of the head showed generalized atrophy with a possible old small infarct in the right parietal lobe but no acute intracranial abnormalities. An MRI confirmed multifocal areas of acute cerebral infarction within the right middle cerebral artery territory. She was admitted for further evaluation and treatment. A neurology consult was requested.  The patient denied any weakness of the left leg. She denied any difficulties with speech or swallowing. She denied loss of sensation.   Patient was not a TPA candidate secondary to late presentation. She was admitted for further evaluation and treatment.  SUBJECTIVE No family members present at the bedside this morning. The patient is feeling mildly improved today. Her left upper extremity has been placed in a splint by the therapists. She has experienced some swelling of her left fingers. We discussed the results of her studies so far.  OBJECTIVE Most recent Vital Signs: Filed Vitals:   04/22/12 1903 04/22/12 2100 04/22/12 2205 04/23/12 0552  BP: 112/45  126/53 107/84  Pulse: 93  75 77  Temp: 97.7 F (36.5 C)  98 F (36.7 C) 97.9 F (36.6 C)  TempSrc: Oral  Oral Oral  Resp: 20  18 18   Height:  5\' 5"  (1.651 m)    Weight:  54.432 kg (120 lb)    SpO2: 94%  97% 96%    CBG (last 3)   Recent Labs  04/21/12 0858  GLUCAP 104*    IV Fluid Intake:     MEDICATIONS  . clopidogrel  75 mg Oral Q breakfast  . dorzolamide-timolol  1 drop Both Eyes BID  . heparin  5,000 Units Subcutaneous Q8H  . irbesartan  300 mg Oral Daily   And  . hydrochlorothiazide  12.5 mg Oral Daily  . latanoprost  1 drop Both Eyes QHS  . multivitamin with minerals  1 tablet Oral Daily  . simvastatin  20 mg Oral q1800   PRN:  alum & mag hydroxide-simeth, hydrALAZINE, senna-docusate  Diet:  General thin liquids Activity:   Up with assistance DVT Prophylaxis:  Subcutaneous heparin.  CLINICALLY SIGNIFICANT STUDIES Basic Metabolic Panel:  Recent Labs Lab 04/21/12 0852 04/21/12 0909 04/21/12 1315  NA 137 140  --   K 3.7 3.8  --   CL 99 100  --   CO2 26  --   --   GLUCOSE 104* 105*  --   BUN 21 22  --   CREATININE 1.04 1.10 1.03  CALCIUM 9.7  --   --    Liver Function Tests:  Recent Labs Lab 04/21/12 0852  AST 21  ALT 11  ALKPHOS 61  BILITOT 0.3  PROT 7.2  ALBUMIN 3.9   CBC:  Recent Labs Lab 04/21/12 0852 04/21/12 0909 04/21/12 1315  WBC 6.0  --  6.6  NEUTROABS 4.3  --   --  HGB 12.2 13.9 12.2  HCT 38.6 41.0 37.0  MCV 82.1  --  81.7  PLT 260  --  261   Coagulation:  Recent Labs Lab 04/21/12 0852  LABPROT 12.7  INR 0.96   Cardiac Enzymes:  Recent Labs Lab 04/21/12 0852  TROPONINI <0.30   Urinalysis:  Recent Labs Lab 04/21/12 1055  COLORURINE YELLOW  LABSPEC 1.013  PHURINE 7.0  GLUCOSEU NEGATIVE  HGBUR NEGATIVE  BILIRUBINUR NEGATIVE  KETONESUR NEGATIVE  PROTEINUR NEGATIVE  UROBILINOGEN 0.2  NITRITE NEGATIVE  LEUKOCYTESUR NEGATIVE   Lipid Panel    Component Value Date/Time   CHOL 159 04/22/2012 0540   TRIG 111 04/22/2012 0540   HDL 46 04/22/2012 0540   CHOLHDL 3.5 04/22/2012 0540   VLDL 22 04/22/2012 0540   LDLCALC 91 04/22/2012 0540   HgbA1C  Lab Results  Component Value Date   HGBA1C 5.8* 04/22/2012    Urine Drug Screen:    No results found for this basename: labopia, cocainscrnur, labbenz, amphetmu, thcu, labbarb    Alcohol Level: No results found for this basename: ETH,  in the last 168 hours  Dg Chest 2 View 04/21/12 IMPRESSION:  1.  No clear acute findings.  2.  Chronic interstitial lung disease. 3.  Cardiomegaly.      Ct Head Wo Contrast 04/21/12  IMPRESSION: Generalized atrophy. Question small old infarct versus volume averaging artifact at right parietal lobe. No acute intracranial abnormalities.     Mr Maxine Glenn Head/brain Wo Cm 04/21/12 IMPRESSION: Multifocal areas of acute cerebral infarction as described.  These lie within the right MCA territory, and given their multiplicity and locations, a shower of emboli is not excluded.  Gyriform foci of T1 shortening affects the right occipital lobe without associated cerebral volume loss.  This could represent previous subpial hemorrhage or prior ischemic episode with reperfusion.  There is no frank lobar hemorrhage.  No significant parenchymal volume loss is noted.    MRA HEAD IMPRESSION: No flow limiting intracranial stenosis.  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  This appears unruptured.     2D Echocardiogram  ejection fraction 65-70%. No cardiac source of emboli identified.  Carotid Dopplers - Right: 40-59% ICA stenosis. ICA/CCA ratio is 1.99. Left: No evidence of significant ICA stenosis. ICA/CCA ratio is 1.46. Bilateral: Vertebral artery flow is antegrade.    EKG: Sinus rhythm rate 84 beats per minutes.  Therapy Recommendations Home health PT;Supervision/Assistance - 24 hour  Physical Exam  General - alert 79 year old female seated in a chair in no acute distress.  Heart - Regular rate and rhythm - no murmer  Lungs - Clear to auscultation  Abdomen - Soft - non tender  Extremities - Distal pulses weak but intact - no edema  Skin - Warm and dry   Neurologic Examination  Mental Status:  Alert, oriented, thought content appropriate. Speech  fluent without evidence of aphasia. Able to follow 3 step commands without difficulty.  Cranial Nerves:  II: Discs not visualized; Visual fields grossly normal, pupils equal, round, reactive to light and accommodation  III,IV, VI: ptosis not present, extra-ocular motions intact bilaterally  V,VII: smile symmetric, facial light touch sensation normal bilaterally  VIII: hearing normal bilaterally  IX,X: gag reflex present  XI: bilateral shoulder shrug  XII: midline tongue extension  Motor:  Right : Upper extremity 5/5 Left: Upper extremity 3-4/5 proximally 1-2/5 distally  Lower extremity 5/5 Lower extremity 5/5  Tone and bulk:normal tone throughout; no atrophy noted  Sensory: Slightly decreased  sensation to light touch in the left upper extremity.  Deep Tendon Reflexes: 2+ and symmetric throughout  Cerebellar:  normal finger-to-nose with the right upper extremity. Unable to perform this testing with the left upper extremity secondary to hemiparesis. Essentially normal heel-to-shin testing in both lower extremities.  Gait: Not tested  CV: pulses palpable throughout     ASSESSMENT Sandra Larson is a 79 y.o. 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   Risk factors - hypertension.   Lipids and HbA1c are controlled  Hospital day # 2  TREATMENT/PLAN  Continue clopidogrel 75 mg orally every day for secondary stroke prevention.  TEE in am  Statin - not indicated based on lipid panel  Risk Factor Modification  Disposition - pending  Hassel Neth [facility] Pager [phone] 04/23/2012 8:49 AM  I have personally obtained a history, examined the patient, evaluated imaging results, and formulated the assessment and plan of care. I agree with the above.   Delia Heady, MD

## 2012-04-23 NOTE — Progress Notes (Signed)
Around 2115, Pt had an episode of vomiting. MD notified, prn order received. Also was told to hold the heparin s/c since pt is scheduled to have TEE tomorrow

## 2012-04-24 ENCOUNTER — Encounter (HOSPITAL_COMMUNITY)
Admission: EM | Disposition: A | Discharge status: Rehabilitation Facility | Payer: Self-pay | Source: Home / Self Care | Attending: Internal Medicine

## 2012-04-24 ENCOUNTER — Encounter (HOSPITAL_COMMUNITY): Payer: Self-pay

## 2012-04-24 HISTORY — PX: TEE WITHOUT CARDIOVERSION: SHX5443

## 2012-04-24 SURGERY — ECHOCARDIOGRAM, TRANSESOPHAGEAL
Anesthesia: Moderate Sedation

## 2012-04-24 MED ORDER — BUTAMBEN-TETRACAINE-BENZOCAINE 2-2-14 % EX AERO
INHALATION_SPRAY | CUTANEOUS | Status: DC | PRN
  Administered 2012-04-24: 2 via TOPICAL

## 2012-04-24 MED ORDER — SODIUM CHLORIDE 0.9 % IV SOLN: INTRAVENOUS | Status: DC

## 2012-04-24 MED ORDER — FENTANYL CITRATE 0.05 MG/ML IJ SOLN
INTRAMUSCULAR | Status: DC | PRN
  Administered 2012-04-24: 20 ug via INTRAVENOUS

## 2012-04-24 MED ORDER — MIDAZOLAM HCL 10 MG/2ML IJ SOLN
INTRAMUSCULAR | Status: DC | PRN
  Administered 2012-04-24: 2 mg via INTRAVENOUS

## 2012-04-24 NOTE — Progress Notes (Signed)
Physical Therapy Treatment Patient Details Name: Sandra Larson MRN: [id number] DOB: 12-20-1913 Today's Date: 04/24/2012 Time: 4098-1191 PT Time Calculation (min): 34 min  PT Assessment / Plan / Recommendation Comments on Treatment Session  79 yo that has already shown improvement in strength in Lt wrist/hand and ability to hold onto RW. Family present and cannot provide 24/7 care, yet can provide intermittent care and interested in possible d/c to CIR. Anticipate from a PT perspective she could achieve modified independent by end of CIR stay. Have asked OT to reassess wrist splint and ?d/c plan for CIR.    Follow Up Recommendations  CIR     Does the patient have the potential to tolerate intense rehabilitation     Barriers to Discharge        Equipment Recommendations  None recommended by PT    Recommendations for Other Services Rehab consult  Frequency Min 4X/week   Plan Discharge plan needs to be updated;Frequency needs to be updated    Precautions / Restrictions Precautions Precautions: Fall Precaution Comments: pt with prior injury to Rt knee with occasional buckling Required Braces or Orthoses: Other Brace/Splint Other Brace/Splint: Lt wrist Restrictions Weight Bearing Restrictions: No   Pertinent Vitals/Pain no apparent distress Reports pain at Lt thumb/webspace (likely due to brace, although no redness noted). Brace removed for handwashing and not reapplied. Wrist/hand elevated and OT made aware and to check on pt/brace.    Mobility  Bed Mobility Bed Mobility: Not assessed Rolling Left: 5: Supervision;With rail Left Sidelying to Sit: 5: Supervision;HOB flat Sitting - Scoot to Edge of Bed: 4: Min guard Details for Bed Mobility Assistance: incr time and effort (especially with seated scooting to EOB) Transfers Transfers: Sit to Stand;Stand to Sit Sit to Stand: 4: Min assist;From chair/3-in-1;With armrests;With upper extremity assist;From toilet Stand to Sit: 4:  Min assist;To chair/3-in-1;With armrests;With upper extremity assist;To toilet Details for Transfer Assistance: x3; VCs for safe hand placement. Ambulation/Gait Ambulation/Gait Assistance: 4: Min assist Ambulation Distance (Feet): 160 Feet (seated rest after 80 ft) Assistive device: Rolling walker Ambulation/Gait Assistance Details: initial cuing for keeping RW closer to her and for incr UE extension/support when Rt knee buckles; RW height adjusted prior to 2nd walk and better able to utilize UEs safely; pt with Rt knee buckling x 6-8 times due to pain/weakness (from prior injury per pt) Gait Pattern: Step-to pattern;Decreased stride length;Right flexed knee in stance;Antalgic;Trunk flexed Gait velocity: decreased Modified Rankin (Stroke Patients Only) Pre-Morbid Rankin Score: Moderate disability Modified Rankin: Moderately severe disability    Exercises General Exercises - Upper Extremity Wrist Extension: AROM;Left;5 reps;Seated (slightly beyond neutral from flexed position) Digit Composite Flexion: AROM;Left;10 reps;Seated Composite Extension: AROM;Left;10 reps;Seated   PT Diagnosis:    PT Problem List:   PT Treatment Interventions:     PT Goals Acute Rehab PT Goals Pt will go Supine/Side to Sit: with supervision PT Goal: Supine/Side to Sit - Progress: Partly met Pt will go Sit to Stand: with supervision PT Goal: Sit to Stand - Progress: Progressing toward goal Pt will go Stand to Sit: with supervision PT Goal: Stand to Sit - Progress: Progressing toward goal Pt will Ambulate: >150 feet;with supervision PT Goal: Ambulate - Progress: Progressing toward goal  Visit Information  Last PT Received On: 04/24/12 Assistance Needed: +1    Subjective Data  Subjective: Reports her fingers are less swollen today, and is still concerned that brace is causing the swelling. Reports Lt thumb is sore in webspace. Patient Stated Goal: to ultimately  return home with intermittent assist    Cognition  Cognition Overall Cognitive Status: Appears within functional limits for tasks assessed/performed Arousal/Alertness: Awake/alert Orientation Level: Appears intact for tasks assessed Behavior During Session: [facility] for tasks performed    Balance  Balance Balance Assessed: No Dynamic Standing Balance Dynamic Standing - Balance Support: No upper extremity supported;During functional activity (washing hands at sink) Dynamic Standing - Level of Assistance: 4: Min assist Dynamic Standing - Comments: reaching forward for faucet, laterally for soap and towels  End of Session PT - End of Session Equipment Utilized During Treatment: Gait belt Activity Tolerance: Patient tolerated treatment well Patient left: in chair;with call bell/phone within reach;with family/visitor present Nurse Communication: Mobility status;Other (comment) (plan to request CIR consult; OT to see re: brace)   GP     Shrey Boike 04/24/2012, 12:05 PM  04/24/2012 Veda Canning, PT Pager: [phone]

## 2012-04-24 NOTE — Progress Notes (Signed)
Occupational Therapy Treatment Patient Details Name: Sandra Larson MRN: [id number] DOB: Oct 29, 1913 Today's Date: 04/24/2012 Time: 4098-1191 OT Time Calculation (min): 28 min  OT Assessment / Plan / Recommendation Comments on Treatment Session Pt making progress and doing well and should continue with OT services to maximize level of safety and independence. Would like CIR consult for pt at this time, discussd with PT, PT agreeable    Follow Up Recommendations  CIR    Barriers to Discharge   pt lives at home alone, family lives next door and close by, but at work during the day    Equipment Recommendations  None recommended by OT;Other (comment) (TBD)    Recommendations for Other Services Rehab consult  Frequency Min 3X/week   Plan      Precautions / Restrictions Precautions Precautions: Fall Precaution Comments: pt with prior injury to Rt knee with occasional buckling Required Braces or Orthoses: Other Brace/Splint Other Brace/Splint: Lt wrist Restrictions Weight Bearing Restrictions: No   Pertinent Vitals/Pain     ADL  Grooming: Performed;Wash/dry hands;Min guard Where Assessed - Grooming: Supported standing Upper Body Bathing: Moderate assistance Upper Body Dressing: Performed;Moderate assistance Where Assessed - Upper Body Dressing: Unsupported sitting Toilet Transfer: Performed;Minimal assistance Toilet Transfer Method: Sit to stand Toilet Transfer Equipment: Regular height toilet Toileting - Clothing Manipulation and Hygiene: Performed;Minimal assistance Where Assessed - Toileting Clothing Manipulation and Hygiene: Sit on 3-in-1 or toilet Equipment Used: Rolling walker Transfers/Ambulation Related to ADLs: verbal cues to keep L hand on RW ADL Comments: Pt not wearing L hand splint at start of tx, OT donned splint and pt states that she felt that staff had put it on too tightly before and that it made her thumb swell so the Dr removed it yesterday. OT able to get  2 fingers under splint after donning so not too tight, pt stated that it felt better. Pt wanted splint off at end of session. OT positioned pt's L UE in elevation while pt seated in recliner    OT Diagnosis:    OT Problem List:   OT Treatment Interventions:     OT Goals ADL Goals ADL Goal: Grooming - Progress: Progressing toward goals ADL Goal: Upper Body Bathing - Progress: Progressing toward goals ADL Goal: Upper Body Dressing - Progress: Progressing toward goals ADL Goal: Toilet Transfer - Progress: Progressing toward goals ADL Goal: Toileting - Clothing Manipulation - Progress: Progressing toward goals ADL Goal: Toileting - Hygiene - Progress: Progressing toward goals Miscellaneous OT Goals OT Goal: Miscellaneous Goal #2 - Progress: Progressing toward goals  Visit Information  Last OT Received On: 04/24/12 Assistance Needed: +1    Subjective Data  Subjective: " I know I can't go home by myself right now " Patient Stated Goal: To return home after rehab at [facility] or SNF   Prior Functioning  Home Living Lives With: Alone Available Help at Discharge: Family;Available PRN/intermittently Type of Home: House Prior Function Vocation: Retired Dominant Hand: Right    Cognition  Cognition Overall Cognitive Status: Appears within functional limits for tasks assessed/performed Arousal/Alertness: Awake/alert Orientation Level: Appears intact for tasks assessed Behavior During Session: [facility] for tasks performed    Mobility  Bed Mobility Bed Mobility: Not assessed Transfers Transfers: Sit to Stand;Stand to Sit Sit to Stand: 4: Min assist;From chair/3-in-1;With armrests;With upper extremity assist;From toilet Stand to Sit: 4: Min assist;To chair/3-in-1;With armrests;With upper extremity assist;To toilet Details for Transfer Assistance: VCs for safe hand placement.    Exercises  General Exercises - Upper  Extremity Wrist Extension: AROM;Left;15 reps;Seated Digit Composite Flexion:  AROM;Left;Seated;15 reps Composite Extension: AROM;Left;15 reps;Seated   Balance Balance Balance Assessed: No    End of Session OT - End of Session Equipment Utilized During Treatment: Gait belt;Other (comment) (RW, L hand splint) Activity Tolerance: Patient tolerated treatment well Patient left: in chair;with call bell/phone within reach;with family/visitor present  GO     Galen Manila 04/24/2012, 12:13 PM

## 2012-04-24 NOTE — Progress Notes (Signed)
Stroke Team Progress Note  HISTORY Sandra Larson is a pleasant 79 y.o. right-handed female who lives alone in [place]. There are multiple family members who live close by and check on her frequently. She quit driving several years ago but otherwise has been very independent and continues to cook, clean house, and perform ADLs independently. She uses a rolling walker in her home for ambulation.  Approximately 7-10 days ago the patient noted some mild left upper extremity weakness. This gradually progressed and yesterday 04/21/2012 she went to turn on the bathroom light switch with her left hand and her arm collapsed into the sink. She presented to [facility] 04/21/12 and a CT of the head showed generalized atrophy with a possible old small infarct in the right parietal lobe but no acute intracranial abnormalities. An MRI confirmed multifocal areas of acute cerebral infarction within the right middle cerebral artery territory. She was admitted for further evaluation and treatment. A neurology consult was requested.  The patient denied any weakness of the left leg. She denied any difficulties with speech or swallowing. She denied loss of sensation. Patient was not a TPA candidate secondary to late presentation. She was admitted for further evaluation and treatment.  SUBJECTIVE Patient lying in bed. Awaiting TEE later today.  OBJECTIVE Most recent Vital Signs: Filed Vitals:   04/23/12 1800 04/23/12 2124 04/24/12 0035 04/24/12 0700  BP: 119/59 113/58 132/69 135/67  Pulse: 97 86 79 74  Temp: 98 F (36.7 C) 98.3 F (36.8 C) 97.7 F (36.5 C)   TempSrc: Oral Oral Oral   Resp: 18 18 16 18   Height:      Weight:      SpO2: 99% 96% 97% 98%   CBG (last 3)  No results found for this basename: GLUCAP,  in the last 72 hours IV Fluid Intake:     MEDICATIONS  . clopidogrel  75 mg Oral Q breakfast  . dorzolamide-timolol  1 drop Both Eyes BID  . heparin  5,000 Units Subcutaneous  Q8H  . irbesartan  300 mg Oral Daily   And  . hydrochlorothiazide  12.5 mg Oral Daily  . latanoprost  1 drop Both Eyes QHS  . multivitamin with minerals  1 tablet Oral Daily  . simvastatin  20 mg Oral q1800   PRN:  alum & mag hydroxide-simeth, hydrALAZINE, ondansetron, senna-docusate  Diet:  NPO  Activity:   Up with assistance DVT Prophylaxis:  Subcutaneous heparin.  CLINICALLY SIGNIFICANT STUDIES Basic Metabolic Panel:   Recent Labs Lab 04/21/12 0852 04/21/12 0909 04/21/12 1315  NA 137 140  --   K 3.7 3.8  --   CL 99 100  --   CO2 26  --   --   GLUCOSE 104* 105*  --   BUN 21 22  --   CREATININE 1.04 1.10 1.03  CALCIUM 9.7  --   --    Liver Function Tests:   Recent Labs Lab 04/21/12 0852  AST 21  ALT 11  ALKPHOS 61  BILITOT 0.3  PROT 7.2  ALBUMIN 3.9   CBC:   Recent Labs Lab 04/21/12 0852 04/21/12 0909 04/21/12 1315  WBC 6.0  --  6.6  NEUTROABS 4.3  --   --   HGB 12.2 13.9 12.2  HCT 38.6 41.0 37.0  MCV 82.1  --  81.7  PLT 260  --  261   Coagulation:   Recent Labs Lab 04/21/12 0852  LABPROT 12.7  INR 0.96  Cardiac Enzymes:   Recent Labs Lab 04/21/12 0852  TROPONINI <0.30   Urinalysis:   Recent Labs Lab 04/21/12 1055  COLORURINE YELLOW  LABSPEC 1.013  PHURINE 7.0  GLUCOSEU NEGATIVE  HGBUR NEGATIVE  BILIRUBINUR NEGATIVE  KETONESUR NEGATIVE  PROTEINUR NEGATIVE  UROBILINOGEN 0.2  NITRITE NEGATIVE  LEUKOCYTESUR NEGATIVE   Lipid Panel    Component Value Date/Time   CHOL 159 04/22/2012 0540   TRIG 111 04/22/2012 0540   HDL 46 04/22/2012 0540   CHOLHDL 3.5 04/22/2012 0540   VLDL 22 04/22/2012 0540   LDLCALC 91 04/22/2012 0540   HgbA1C  Lab Results  Component Value Date   HGBA1C 5.8* 04/22/2012    Urine Drug Screen:   No results found for this basename: labopia,  cocainscrnur,  labbenz,  amphetmu,  thcu,  labbarb    Alcohol Level: No results found for this basename: ETH,  in the last 168 hours  CT Head 04/21/12 Generalized atrophy.  Question small old infarct versus volume averaging artifact at right parietal lobe. No acute intracranial abnormalities.     MRA Head/brain 04/21/12 Multifocal areas of acute cerebral infarction as described.  These lie within the right MCA territory, and given their multiplicity and locations, a shower of emboli is not excluded.  Gyriform foci of T1 shortening affects the right occipital lobe without associated cerebral volume loss.  This could represent previous subpial hemorrhage or prior ischemic episode with reperfusion.  There is no frank lobar hemorrhage.  No significant parenchymal volume loss is noted.    MRA HEAD 04/21/12 No flow limiting intracranial stenosis.  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  This appears unruptured.     2D Echocardiogram  ejection fraction 65-70%. No cardiac source of emboli identified.  Carotid Dopplers - Right: 40-59% ICA stenosis. ICA/CCA ratio is 1.99. Left: No evidence of significant ICA stenosis. ICA/CCA ratio is 1.46. Bilateral: Vertebral artery flow is antegrade.    TEE    Chest XRAY 04/21/12 1.  No clear acute findings.  2.  Chronic interstitial lung disease. 3.  Cardiomegaly.      EKG: Sinus rhythm rate 84 beats per minutes.  Therapy Recommendations Home health PT;Supervision/Assistance - 24 hour  Physical Exam  General - alert 79 year old female seated in a chair in no acute distress.  Heart - Regular rate and rhythm - no murmer  Lungs - Clear to auscultation  Abdomen - Soft - non tender  Extremities - Distal pulses weak but intact - no edema  Skin - Warm and dry   Neurologic Examination  Mental Status:  Alert, oriented, thought content appropriate. Speech fluent without evidence of aphasia. Able to follow 3 step commands without difficulty.  Cranial Nerves:  II: Discs not visualized; Visual fields grossly normal, pupils equal, round, reactive to light and accommodation  III,IV, VI: ptosis not present, extra-ocular motions intact  bilaterally  V,VII: smile symmetric, facial light touch sensation normal bilaterally  VIII: hearing normal bilaterally  IX,X: gag reflex present  XI: bilateral shoulder shrug  XII: midline tongue extension  Motor:  Right : Upper extremity 5/5 Left: Upper extremity 3-4/5 proximally 1-2/5 distally  Lower extremity 5/5 Lower extremity 5/5  Tone and bulk:normal tone throughout; no atrophy noted  Sensory: Slightly decreased sensation to light touch in the left upper extremity.  Deep Tendon Reflexes: 2+ and symmetric throughout  Cerebellar:  normal finger-to-nose with the right upper extremity. Unable to perform this testing with the left upper extremity secondary to hemiparesis. Essentially  normal heel-to-shin testing in both lower extremities.  Gait: Not tested  CV: pulses palpable throughout   ASSESSMENT Sandra Larson is a 79 y.o. 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 On no antiplatelets her home med rec prior to admission. Now on clopidogrel 75 mg orally every day for secondary stroke prevention. Patient with resultant LUE hemiparesis. Work up underway.   Hypertension LDL 91 HgbA1c 5.8  Hospital day # 3  TREATMENT/PLAN  Continue clopidogrel 75 mg orally every day for secondary stroke prevention. TEE to look for embolic source. If positive for PFO (patent foramen ovale), check bilateral lower extremity venous dopplers to rule out DVT as possible source of stroke.  If TEE unrevealing, please schedule outpatient telemetry monitoring to assess patient for atrial fibrillation as source of stroke. May be arranged with patient's cardiologist, or cardiologist of choice.  Home health PT recommended with 24h supervision. She states she lives alone and all her family works. Ok for discharge from neuro standpoint following TEE  Annie Main,  MSN, RN, ANVP-BC, ANP-BC, [facility] Pager: [phone] 04/24/2012 9:48 AM  I have personally obtained a history, examined the patient, evaluated imaging results, and formulated the assessment and plan of care. I agree with the above.  Delia Heady, MD

## 2012-04-24 NOTE — Consult Note (Signed)
[facility]       CONSULTATION NOTE   Reason for Consult: Embolic CVA  Requesting Physician: Rai  Cardiologist: None  HPI: This is a 79 y.o. female with a past medical history significant for HTN and esophageal strictures requiring esophagoplasty (last performed 10 years ago) presenting with left arm weakness and imaging evidence suggestive of embolic stroke. Despite her advanced age she lives independently and is highly functional.  As there is no evidence for major carotid/intracranial atherosclarosis she is referred for TEE.  No atrial fibrillation on monitor, but frequent atrial ectopy.  PMHx:  Past Medical History  Diagnosis Date  . Hypertension   . Glaucoma    Past Surgical History  Procedure Laterality Date  . Appendectomy    . Cataract extraction      FAMHx: Family History  Problem Relation Age of Onset  . Stroke Mother   . Stroke Father     SOCHx:  reports that she has never smoked. She does not have any smokeless tobacco history on file. She reports that she does not drink alcohol or use illicit drugs.  ALLERGIES: Allergies  Allergen Reactions  . Penicillins     chilhood  . Sulfa Antibiotics Rash    ROS: he patient denies anorexia, fever, weight loss, vision loss, decreased hearing, hoarseness, chest pain, syncope, dyspnea on exertion, peripheral edema, balance deficits, hemoptysis, abdominal pain, melena, hematochezia, severe indigestion/heartburn, hematuria, incontinence, genital sores, muscle weakness, suspicious skin lesions, transient blindness, difficulty walking, depression, unusual weight change, abnormal bleeding, enlarged lymph nodes, angioedema, and breast masses.    HOME MEDICATIONS: Prescriptions prior to admission  Medication Sig Dispense Refill  . dorzolamide-timolol (COSOPT) 22.3-6.8 MG/ML ophthalmic solution Place 1 drop into both eyes 2 (two) times daily.      Marland Kitchen latanoprost (XALATAN) 0.005 %  ophthalmic solution Place 1 drop into both eyes at bedtime.      . Multiple Vitamin (MULTIVITAMIN WITH MINERALS) TABS Take 1 tablet by mouth daily.      . valsartan-hydrochlorothiazide (DIOVAN-HCT) 320-12.5 MG per tablet Take 1 tablet by mouth daily.        HOSPITAL MEDICATIONS: I have reviewed the patient's current medications.  VITALS: Blood pressure 135/67, pulse 74, temperature 97.7 F (36.5 C), temperature source Oral, resp. rate 18, height 5\' 5"  (1.651 m), weight 54.432 kg (120 lb), SpO2 98.00%.  PHYSICAL EXAM: General appearance: alert, cooperative, no distress and very slender Neck: no adenopathy, no carotid bruit, no JVD, supple, symmetrical, trachea midline and thyroid not enlarged, symmetric, no tenderness/mass/nodules Lungs: clear to auscultation bilaterally Heart: regular rate and rhythm, S1, S2 normal, no murmur, click, rub or gallop Abdomen: soft, non-tender; bowel sounds normal; no masses,  no organomegaly Extremities: extremities normal, atraumatic, no cyanosis or edema Pulses: 2+ and symmetric Skin: Skin color, texture, turgor normal. No rashes or lesions Neurologic: Motor: grade 1 left upper extremity, otherwise normal  LABS: No results found for this or any previous visit (from the past 48 hour(s)).  IMAGING: MRI: Multifocal areas of acute infarction affect the right frontal cortex, right posterior frontal cortex, right parietal cortex, and adjacent subcortical white matter. There is no visible associated hemorrhage. MRA: No flow limiting intracranial stenosis. 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 This appears unruptured.   ECG: NSR. PACs. RBBB.  IMPRESSION: 1. Possible cardioembolic CVA  RECOMMENDATION: 1. TEE. Hopefully esophagus can be safely intubated and there is currently no stricture.  This procedure has been fully  reviewed with the patient and written informed consent has been obtained. If no overt source of embolism, consider event  monitor/loop recorder.  Time Spent Directly with Patient: 30 minutes  Thurmon Fair, MD, [facility] Attending Cardiologist [facility] [phone] office [phone] pager   Denilson Salminen 04/24/2012, 11:46 AM

## 2012-04-24 NOTE — Evaluation (Signed)
Speech Language Pathology Evaluation Patient Details Name: Sandra Larson MRN: [id number] DOB: [birth year] Today's Date: 04/24/2012 Time: 1000-1014 SLP Time Calculation (min): 14 min  Problem List:  Patient Active Problem List  Diagnosis  . CVA (cerebral infarction)  . HTN (hypertension)   Past Medical History:  Past Medical History  Diagnosis Date  . Hypertension   . Glaucoma    Past Surgical History:  Past Surgical History  Procedure Laterality Date  . Appendectomy    . Cataract extraction     HPI:  Sandra Larson is a pleasant 79 y.o. right-handed female who lives alone in [place]. There are multiple family members who live close by and check on her frequently. She quit driving several years ago but otherwise has been very independent and continues to cook, clean house, and perform ADLs independently. She uses a rolling walker in her home for ambulation.     Assessment / Plan / Recommendation Clinical Impression  Patient presents with no overt cognitive-linguistic deficits and oral motor/sensory functions that are WNL. Pt was administered the Cogn[employer] standardized assessment at bedside. Pt w/ memory, attention, judgment/safety, and reasoning skills WFL for pt's advanced age.  Pt and family report she is back at baseline and did not express any questions/concerns regarding cognition and/or language at this time. No SLP f/u recommended at this time.    SLP Assessment  Patient does not need any further Speech Lanaguage Pathology Services    Follow Up Recommendations  None    Frequency and Duration        Pertinent Vitals/Pain None reported   SLP Goals     SLP Evaluation Prior Functioning  Cognitive/Linguistic Baseline: Within functional limits Type of Home: House Lives With: Alone Available Help at Discharge: Family;Available PRN/intermittently Vocation: Retired   IT consultant  Overall Cognitive Status: Appears within functional limits for  tasks assessed Arousal/Alertness: Awake/alert Orientation Level: Oriented X4 Memory: Appears intact Awareness: Appears intact Problem Solving: Appears intact Safety/Judgment: Appears intact    Comprehension  Auditory Comprehension Overall Auditory Comprehension: Appears within functional limits for tasks assessed Yes/No Questions: Not tested Commands: Within Functional Limits Visual Recognition/Discrimination Discrimination: Not tested Reading Comprehension Reading Status: Not tested    Expression Expression Primary Mode of Expression: Verbal Verbal Expression Overall Verbal Expression: Appears within functional limits for tasks assessed Initiation: No impairment Repetition: No impairment Naming: No impairment Pragmatics: No impairment Written Expression Dominant Hand: Right Written Expression: Not tested   Oral / Motor Oral Motor/Sensory Function Overall Oral Motor/Sensory Function: Appears within functional limits for tasks assessed Labial ROM: Within Functional Limits Labial Symmetry: Within Functional Limits Labial Strength: Within Functional Limits Labial Sensation: Within Functional Limits Lingual ROM: Within Functional Limits Lingual Symmetry: Within Functional Limits Lingual Strength: Within Functional Limits Lingual Sensation: Within Functional Limits Facial ROM: Within Functional Limits Facial Symmetry: Within Functional Limits Facial Strength: Within Functional Limits Facial Sensation: Within Functional Limits Velum: Within Functional Limits Mandible: Within Functional Limits Motor Speech Overall Motor Speech: Appears within functional limits for tasks assessed Phonation: Normal Resonance: Within functional limits Articulation: Within functional limitis Intelligibility: Intelligible Motor Planning: Witnin functional limits   GO   Berdine Dance SLP student   Berdine Dance 04/24/2012, 10:46 AM

## 2012-04-24 NOTE — CV Procedure (Signed)
Sandra Larson Female, 79 y.o., 05/13/1913  Location: [facility]OSCOPY  Bed: 4N12C-01  MRN: [id number]   INDICATIONS: Embolic stroke  PROCEDURE:   Informed consent was obtained prior to the procedure. The risks, benefits and alternatives for the procedure were discussed and the patient comprehended these risks.  Risks include, but are not limited to, cough, sore throat, vomiting, nausea, somnolence, esophageal and stomach trauma or perforation, bleeding, low blood pressure, aspiration, pneumonia, infection, trauma to the teeth and death.    After a procedural time-out, the oropharynx was anesthetized with 20% benzocaine spray. The patient was given 2 mg versed and 25 mcg fentanyl for moderate sedation.   The transesophageal probe was inserted in the esophagus and stomach without difficulty and multiple views were obtained.  The patient was kept under observation until the patient left the procedure room.  The patient left the procedure room in stable condition.   Agitated microbubble saline contrast was administered.  COMPLICATIONS:   There were no immediate complications.  FINDINGS:  Normal LV size and function Non-dilated left atrium No intracardiac shunt No intracardiac thrombus/vegetation/mass Mild degenerative changes of the aortic valve Moderate ulcerated plaque in the aortic arch  RECOMMENDATIONS:   Consider event monitor/loop recorder Antiplatelet therapy  Time Spent Directly with the Patient:  30 minutes   [name] 04/24/2012, 3:34 PM

## 2012-04-24 NOTE — Progress Notes (Signed)
Rehab Admissions Coordinator Note:  Patient was screened by Clois Dupes for appropriateness for an Inpatient Acute Rehab Consult.  At this time, we are recommending Inpatient Rehab consult.  Clois Dupes 04/24/2012, 12:15 PM  I can be reached at [phone].

## 2012-04-24 NOTE — Progress Notes (Signed)
Patient ID: Sandra Larson  female  UJW:[id number]    DOB: Jan 12, 1914    DOA: 04/21/2012  PCP: Dina Rich, MD  Assessment/Plan: Principal Problem:   CVA (cerebral infarction):  - MRI with multifocal areas of infarction in the right MCA territory - MRA showed 4x67mm anterior communicating artery aneurysm, unruptured - Carotid Dopplers showed right 40-59% ICA stenosis left no evidence of significant ICA stenosis - 2-D echo showed EF of 65-70%, no cardiac source of emboli - Awaiting for TEE today. If positive, will check bilateral lower extremity Dopplers to rule out DVT otherwise outpatient telemetry monitoring (Holter) - Placed on Plavix 75 mg daily for secondary stroke prevention - PT recommending skilled nursing facility, patient lives alone  Active Problems:   HTN (hypertension) - Currently stable  DVT Prophylaxis:  Code Status: DO NOT RESUSCITATE  Disposition: Discussed with Child psychotherapist, for skilled nursing facility, hopefully tomorrow    Subjective: Feels okay, awaiting TEE today  Objective: Weight change:  No intake or output data in the 24 hours ending 04/24/12 1024 Blood pressure 135/67, pulse 74, temperature 97.7 F (36.5 C), temperature source Oral, resp. rate 18, height 5\' 5"  (1.651 m), weight 54.432 kg (120 lb), SpO2 98.00%.  Physical Exam: General: Alert and awake, oriented x3, not in any acute distress. CVS: S1-S2 clear, no murmur rubs or gallops Chest: clear to auscultation bilaterally, no wheezing, rales or rhonchi Abdomen: soft nontender, nondistended, normal bowel sounds Extremities: no cyanosis, clubbing or edema noted bilaterally  Lab Results: Basic Metabolic Panel:  Recent Labs Lab 04/21/12 0852 04/21/12 0909 04/21/12 1315  NA 137 140  --   K 3.7 3.8  --   CL 99 100  --   CO2 26  --   --   GLUCOSE 104* 105*  --   BUN 21 22  --   CREATININE 1.04 1.10 1.03  CALCIUM 9.7  --   --    Liver Function Tests:  Recent Labs Lab 04/21/12 0852   AST 21  ALT 11  ALKPHOS 61  BILITOT 0.3  PROT 7.2  ALBUMIN 3.9   No results found for this basename: LIPASE, AMYLASE,  in the last 168 hours No results found for this basename: AMMONIA,  in the last 168 hours CBC:  Recent Labs Lab 04/21/12 0852 04/21/12 0909 04/21/12 1315  WBC 6.0  --  6.6  NEUTROABS 4.3  --   --   HGB 12.2 13.9 12.2  HCT 38.6 41.0 37.0  MCV 82.1  --  81.7  PLT 260  --  261   Cardiac Enzymes:  Recent Labs Lab 04/21/12 0852  TROPONINI <0.30   BNP: No components found with this basename: POCBNP,  CBG:  Recent Labs Lab 04/21/12 0858  GLUCAP 104*     Micro Results: No results found for this or any previous visit (from the past 240 hour(s)).  Studies/Results: Dg Chest 2 View  04/21/2012  *RADIOLOGY REPORT*  Clinical Data: Stroke  CHEST - 2 VIEW  Comparison: None.  Findings: The cardiac silhouette is enlarged.  There is kyphosis and scoliosis of the spine.  There are low lung volumes.  No effusion, infiltrate, or pneumothorax.  There is underlying chronic interstitial lung disease.  IMPRESSION:  1.  No clear acute findings.  2.  Chronic interstitial lung disease. 3.  Cardiomegaly.   Original Report Authenticated By: Genevive Bi, M.D.    Ct Head Wo Contrast  04/21/2012  *RADIOLOGY REPORT*  Clinical Data: Left arm weakness and numbness  with pain in the loss of dexterity, symptoms began 04/12/2012, today unable to lift left arm; history hypertension  CT HEAD WITHOUT CONTRAST  Technique:  Contiguous axial images were obtained from the base of the skull through the vertex without contrast.  Comparison: None  Findings: Generalized atrophy. Normal ventricular morphology. No midline shift or mass effect. Tiny sulcal calcifications right parietal, nonspecific. Question small old infarct versus volume averaging artifact at right parietal lobe. Otherwise normal appearance of brain parenchyma. No intracranial hemorrhage, mass lesion or evidence of acute infarction.  No extra-axial fluid collections. Visualized paranasal sinuses and mastoid air cells clear. Extensive atherosclerotic calcifications of internal carotid arteries at skull base. No acute osseous findings.  IMPRESSION: Generalized atrophy. Question small old infarct versus volume averaging artifact at right parietal lobe. No acute intracranial abnormalities.   Original Report Authenticated By: Ulyses Southward, M.D.    Mr Brain Wo Contrast  04/21/2012  *RADIOLOGY REPORT*  Clinical Data:  Left arm weakness.  Stroke risk factors include previous history of stroke and hypertension.  MRI HEAD WITHOUT CONTRAST MRA HEAD WITHOUT CONTRAST  Technique:  Multiplanar, multiecho pulse sequences of the brain and surrounding structures were obtained without intravenous contrast. Angiographic images of the head were obtained using MRA technique without contrast.  Comparison:  CT head earlier in the day.  MRI HEAD  Findings:  Multifocal areas of acute infarction affect the right frontal cortex, right posterior frontal cortex, right parietal cortex, and adjacent subcortical white matter.  There is no visible associated hemorrhage.  Moderate age related atrophy is noted.  Chronic microvascular ischemic change affects the periventricular greater than subcortical white matter.  No significant chronic large vessel infarct.  Gyriform T1 shortening in the right occipital lobe could represent a prior post traumatic subpial hemorrhage, or prior ischemic insult with reperfusion.  No significant encephalomalacia.  No midline shift.  Dolichoectatic cerebral vasculature. Bulbous ACA abnormality, described below.  Pituitary and cerebellar tonsils unremarkable.  Mild cervical spondylosis.  No worrisome osseous lesions.  Clear sinuses and mastoids.  IMPRESSION: Multifocal areas of acute cerebral infarction as described.  These lie within the right MCA territory, and given their multiplicity and locations, a shower of emboli is not excluded.  Gyriform  foci of T1 shortening affects the right occipital lobe without associated cerebral volume loss.  This could represent previous subpial hemorrhage or prior ischemic episode with reperfusion.  There is no frank lobar hemorrhage.  No significant parenchymal volume loss is noted.  MRA HEAD  Findings: The internal carotid arteries are widely patent.  The basilar artery is widely patent left vertebral dominant.  Right vertebral is patent.  No proximal MCA stenosis is seen.  Both anterior cerebral arteries are patent.  There is mild nonstenotic irregularity both posterior cerebrals.  No cerebellar branch occlusion is present.  There is a 4 x 7 mm anterior communicating artery aneurysm at the junction of the left A1 and ACA.  This appears unruptured.  The aneurysm is wide necked.  IMPRESSION: No flow limiting intracranial stenosis.  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  This appears unruptured.   Original Report Authenticated By: Davonna Belling, M.D.    Mr Mra Head/brain Wo Cm  04/21/2012  *RADIOLOGY REPORT*  Clinical Data:  Left arm weakness.  Stroke risk factors include previous history of stroke and hypertension.  MRI HEAD WITHOUT CONTRAST MRA HEAD WITHOUT CONTRAST  Technique:  Multiplanar, multiecho pulse sequences of the brain and surrounding structures were obtained without intravenous contrast. Angiographic  images of the head were obtained using MRA technique without contrast.  Comparison:  CT head earlier in the day.  MRI HEAD  Findings:  Multifocal areas of acute infarction affect the right frontal cortex, right posterior frontal cortex, right parietal cortex, and adjacent subcortical white matter.  There is no visible associated hemorrhage.  Moderate age related atrophy is noted.  Chronic microvascular ischemic change affects the periventricular greater than subcortical white matter.  No significant chronic large vessel infarct.  Gyriform T1 shortening in the right occipital lobe could represent a prior  post traumatic subpial hemorrhage, or prior ischemic insult with reperfusion.  No significant encephalomalacia.  No midline shift.  Dolichoectatic cerebral vasculature. Bulbous ACA abnormality, described below.  Pituitary and cerebellar tonsils unremarkable.  Mild cervical spondylosis.  No worrisome osseous lesions.  Clear sinuses and mastoids.  IMPRESSION: Multifocal areas of acute cerebral infarction as described.  These lie within the right MCA territory, and given their multiplicity and locations, a shower of emboli is not excluded.  Gyriform foci of T1 shortening affects the right occipital lobe without associated cerebral volume loss.  This could represent previous subpial hemorrhage or prior ischemic episode with reperfusion.  There is no frank lobar hemorrhage.  No significant parenchymal volume loss is noted.  MRA HEAD  Findings: The internal carotid arteries are widely patent.  The basilar artery is widely patent left vertebral dominant.  Right vertebral is patent.  No proximal MCA stenosis is seen.  Both anterior cerebral arteries are patent.  There is mild nonstenotic irregularity both posterior cerebrals.  No cerebellar branch occlusion is present.  There is a 4 x 7 mm anterior communicating artery aneurysm at the junction of the left A1 and ACA.  This appears unruptured.  The aneurysm is wide necked.  IMPRESSION: No flow limiting intracranial stenosis.  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  This appears unruptured.   Original Report Authenticated By: Davonna Belling, M.D.     Medications: Scheduled Meds: . clopidogrel  75 mg Oral Q breakfast  . dorzolamide-timolol  1 drop Both Eyes BID  . heparin  5,000 Units Subcutaneous Q8H  . irbesartan  300 mg Oral Daily   And  . hydrochlorothiazide  12.5 mg Oral Daily  . latanoprost  1 drop Both Eyes QHS  . multivitamin with minerals  1 tablet Oral Daily  . simvastatin  20 mg Oral q1800      LOS: 3 days   [place] M.D. [facility] 04/24/2012, 10:24 AM Pager: [phone]  If 7PM-7AM, please contact night-coverage [email or web address] Password [email or web address]

## 2012-04-24 NOTE — Consult Note (Signed)
Physical Medicine and Rehabilitation Consult Reason for Consult: CVA Referring Physician: [facility]   HPI: Sandra Larson is a 79 y.o. right hand female with history of hypertension who lives alone in [place]. She uses a rolling walker in her home for ambulation. Admitted 04/21/2012 with progressive left-sided weakness over a seven-day period. MRI of the brain showed multifocal areas of acute cerebral infarct. These lie within the right MCA territory. 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 Patient did not receive TPA. Echocardiogram with ejection fraction of 65-70% without emboli. Carotid Dopplers with right 40-59% ICA stenosis. TEE was pending. Neurology services consulted maintained on Plavix therapy for stroke prophylaxis as well as subcutaneous heparin for DVT prophylaxis. Physical and occupational therapy evaluation completed 04/22/2012 an ongoing. M.D. is requested physical medicine rehabilitation consult to consider inpatient rehabilitation services   Review of Systems  Gastrointestinal: Positive for constipation.       Reflux  Musculoskeletal: Positive for myalgias and joint pain.  All other systems reviewed and are negative.   Past Medical History  Diagnosis Date  . Hypertension   . Glaucoma    Past Surgical History  Procedure Laterality Date  . Appendectomy    . Cataract extraction     Family History  Problem Relation Age of Onset  . Stroke Mother   . Stroke Father    Social History:  reports that she has never smoked. She does not have any smokeless tobacco history on file. She reports that she does not drink alcohol or use illicit drugs. Allergies:  Allergies  Allergen Reactions  . Penicillins     chilhood  . Sulfa Antibiotics Rash   Medications Prior to Admission  Medication Sig Dispense Refill  . dorzolamide-timolol (COSOPT) 22.3-6.8 MG/ML ophthalmic solution Place 1 drop into both eyes 2  (two) times daily.      Marland Kitchen latanoprost (XALATAN) 0.005 % ophthalmic solution Place 1 drop into both eyes at bedtime.      . Multiple Vitamin (MULTIVITAMIN WITH MINERALS) TABS Take 1 tablet by mouth daily.      . valsartan-hydrochlorothiazide (DIOVAN-HCT) 320-12.5 MG per tablet Take 1 tablet by mouth daily.        Home: Home Living Lives With: Alone Available Help at Discharge: Family;Available PRN/intermittently Type of Home: House Home Access: Stairs to enter [employer] of Steps: 3 Entrance Stairs-Rails: Left;Right Home Layout: One level Bathroom Shower/Tub: Walk-in shower;Door (sponge bath) Bathroom Toilet: Standard Home Adaptive Equipment: Walker - rolling;Straight cane (started using RW few weeks ago, cane previous)  Functional History: Prior Function Able to Take Stairs?: Yes Driving: No Vocation: Retired Functional Status:  Mobility: Bed Mobility Bed Mobility: Not assessed Rolling Left: 5: Supervision;With rail Left Sidelying to Sit: 5: Supervision;HOB flat Supine to Sit: 4: Min assist Sitting - Scoot to Edge of Bed: 4: Min guard Transfers Transfers: Sit to Stand;Stand to Sit Sit to Stand: 4: Min assist;From chair/3-in-1;With armrests;With upper extremity assist;From toilet Stand to Sit: 4: Min assist;To chair/3-in-1;With armrests;With upper extremity assist;To toilet Ambulation/Gait Ambulation/Gait Assistance: 4: Min assist Ambulation Distance (Feet): 160 Feet (seated rest after 80 ft) Assistive device: Rolling walker Ambulation/Gait Assistance Details: initial cuing for keeping RW closer to her and for incr UE extension/support when Rt knee buckles; RW height adjusted prior to 2nd walk and better able to utilize UEs safely; pt with Rt knee buckling x 6-8 times due to pain/weakness (from prior injury per pt) Gait Pattern: Step-to  pattern;Decreased stride length;Right flexed knee in stance;Antalgic;Trunk flexed Gait velocity: decreased General Gait Details:  steady with assist to support hand on rw (R), Max VCs to attend to R hand placement and positioning; VCs for body positioning within rw Stairs: No    ADL: ADL Grooming: Performed;Wash/dry hands;Min guard Where Assessed - Grooming: Supported standing Upper Body Bathing: Moderate assistance Upper Body Dressing: Performed;Moderate assistance Where Assessed - Upper Body Dressing: Unsupported sitting Toilet Transfer: Performed;Minimal assistance Toilet Transfer Method: Sit to stand Toilet Transfer Equipment: Regular height toilet Equipment Used: Rolling walker Transfers/Ambulation Related to ADLs: verbal cues to keep L hand on RW ADL Comments: Pt not wearing L hand splint at start of tx, OT donned splint and pt states that she felt that staff had put it on too tightly before and that it made her thumb swell so the Dr removed it yesterday. OT able to get 2 fingers under splint after donning so not too tight, pt stated that it felt better. Pt wanted splint off at end of session. OT positioned pt's L UE in elevation while pt seated in recliner  Cognition: Cognition Overall Cognitive Status: Appears within functional limits for tasks assessed Arousal/Alertness: Awake/alert Orientation Level: Oriented X4 Memory: Appears intact Awareness: Appears intact Problem Solving: Appears intact Safety/Judgment: Appears intact Cognition Overall Cognitive Status: Appears within functional limits for tasks assessed/performed Arousal/Alertness: Awake/alert Orientation Level: Appears intact for tasks assessed Behavior During Session: [facility] for tasks performed Cognition - Other Comments: decr awareness to new deficits  Blood pressure 135/67, pulse 74, temperature 97.7 F (36.5 C), temperature source Oral, resp. rate 18, height 5\' 5"  (1.651 m), weight 54.432 kg (120 lb), SpO2 98.00%. Physical Exam  Vitals reviewed. Constitutional: She is oriented to person, place, and time.  HENT:  Head: Normocephalic.   Eyes: EOM are normal.  Neck: Normal range of motion. Neck supple. No thyromegaly present.  Cardiovascular: Normal rate and regular rhythm.   Pulmonary/Chest: Effort normal and breath sounds normal. No respiratory distress.  Abdominal: Soft. Bowel sounds are normal. She exhibits distension. There is no tenderness.  Musculoskeletal: She exhibits no edema.  Neurological: She is alert and oriented to person, place, and time. She displays normal reflexes. She exhibits normal muscle tone. Coordination abnormal.  Follows full commands. LUE is 2+ deltoid, 3 to 3+ elbow, wrist. , 3+ to 4- HI. LLE grossly 3+ to 4/5. No gross sensory deficits. Good insight and awareness.  Skin: Skin is warm and dry.  Psychiatric: She has a normal mood and affect. Her behavior is normal. Judgment and thought content normal.    No results found for this or any previous visit (from the past 24 hour(s)). No results found.  Assessment/Plan: Diagnosis: Right MCA infarct 1. Does the need for close, 24 hr/day medical supervision in concert with the patient's rehab needs make it unreasonable for this patient to be served in a less intensive setting? Yes 2. Co-Morbidities requiring supervision/potential complications: htn, post-stroke sequelae 3. Due to bladder management, bowel management, safety, skin/wound care, disease management, medication administration and patient education, does the patient require 24 hr/day rehab nursing? Yes 4. Does the patient require coordinated care of a physician, rehab nurse, PT (1-2 hrs/day, 5 days/week) and OT (1-2 hrs/day, 5 days/week) to address physical and functional deficits in the context of the above medical diagnosis(es)? Yes Addressing deficits in the following areas: balance, endurance, locomotion, strength, transferring, bowel/bladder control, bathing, dressing, feeding, grooming, toileting and psychosocial support 5. Can the patient actively participate in an  intensive therapy  program of at least 3 hrs of therapy per day at least 5 days per week? Yes 6. The potential for patient to make measurable gains while on inpatient rehab is excellent 7. Anticipated functional outcomes upon discharge from inpatient rehab are mod I with PT, mod i to supervision with OT, n/a with SLP. 8. Estimated rehab length of stay to reach the above functional goals is: 7-10 days 9. Does the patient have adequate social supports to accommodate these discharge functional goals? Yes and Potentially 10. Anticipated D/C setting: Home 11. Anticipated post D/C treatments: HH therapy 12. Overall Rehab/Functional Prognosis: excellent  RECOMMENDATIONS: This patient's condition is appropriate for continued rehabilitative care in the following setting: CIR Patient has agreed to participate in recommended program. Yes and Potentially Note that insurance prior authorization may be required for reimbursement for recommended care.  Comment: Need to follow up on social supports.   Ranelle Oyster, MD, [place]     04/24/2012

## 2012-04-24 NOTE — Progress Notes (Signed)
Echocardiogram Echocardiogram Transesophageal has been performed.  Tanor Glaspy 04/24/2012, 4:05 PM

## 2012-04-24 NOTE — Clinical Social Work Psychosocial (Signed)
Clinical Social Work Department BRIEF PSYCHOSOCIAL ASSESSMENT 04/24/2012  Patient:  Sandra Larson, Sandra Larson     Account Number:  [id number]     Admit date:  04/21/2012  Clinical Social Worker:  Peggyann Shoals  Date/Time:  04/24/2012 01:46 PM  Referred by:  Physician  Date Referred:  04/24/2012 Referred for  SNF Placement   Other Referral:   Interview type:  Patient Other interview type:    PSYCHOSOCIAL DATA Living Status:  ALONE Admitted from facility:   Level of care:   Primary support name:  [place] Primary support relationship to patient:  CHILD, ADULT Degree of support available:   supportive.    CURRENT CONCERNS Current Concerns  Post-Acute Placement   Other Concerns:    SOCIAL WORK ASSESSMENT / PLAN CSW met with pt to address consult for SNF. CSW introduced herself and explained role of social work. CSW also explained the process of discharging to SNF and prior auth process with [employer].    Pt lives alone, however she has a daughter and 2 granddaughters that live very close by.    Pt is also being follow by CIR. SNF will be a back up discharge plan.    CSW contacted [employer]'s case Production designer, theatre/television/film to [employer]. CSW is waiting to hear back from [facility]. CSW will initiate SNF search for [facility] and follow up with bed offers. CSW will continue to follow.   Assessment/plan status:  Psychosocial Support/Ongoing Assessment of Needs Other assessment/ plan:   Information/referral to community resources:   SNF list    PATIENT'S/FAMILY'S RESPONSE TO PLAN OF CARE: Pt is alert and oriented x4. Pt is very pleasant and agreeable to rehab (CIR vs SNF) prior to returning home.   Dede Query, MSW, LCSW [phone]

## 2012-04-25 ENCOUNTER — Encounter (HOSPITAL_COMMUNITY): Payer: Self-pay | Admitting: Cardiovascular Disease

## 2012-04-25 ENCOUNTER — Inpatient Hospital Stay (HOSPITAL_COMMUNITY)
Admission: RE | Admit: 2012-04-25 | Discharge: 2012-05-05 | DRG: 945 | Disposition: A | Discharge status: Home Health | Payer: Medicare Other | Source: Intra-hospital | Attending: Physical Medicine & Rehabilitation | Admitting: Physical Medicine & Rehabilitation

## 2012-04-25 DIAGNOSIS — M1710 Unilateral primary osteoarthritis, unspecified knee: Secondary | ICD-10-CM

## 2012-04-25 DIAGNOSIS — I6340 Cerebral infarction due to embolism of unspecified cerebral artery: Secondary | ICD-10-CM

## 2012-04-25 DIAGNOSIS — I4891 Unspecified atrial fibrillation: Secondary | ICD-10-CM | POA: Diagnosis present

## 2012-04-25 DIAGNOSIS — I671 Cerebral aneurysm, nonruptured: Secondary | ICD-10-CM

## 2012-04-25 DIAGNOSIS — E785 Hyperlipidemia, unspecified: Secondary | ICD-10-CM

## 2012-04-25 DIAGNOSIS — K219 Gastro-esophageal reflux disease without esophagitis: Secondary | ICD-10-CM

## 2012-04-25 DIAGNOSIS — Z880 Allergy status to penicillin: Secondary | ICD-10-CM

## 2012-04-25 DIAGNOSIS — Z79899 Other long term (current) drug therapy: Secondary | ICD-10-CM

## 2012-04-25 DIAGNOSIS — I4892 Unspecified atrial flutter: Secondary | ICD-10-CM

## 2012-04-25 DIAGNOSIS — Z7901 Long term (current) use of anticoagulants: Secondary | ICD-10-CM

## 2012-04-25 DIAGNOSIS — Z5189 Encounter for other specified aftercare: Principal | ICD-10-CM

## 2012-04-25 DIAGNOSIS — Z823 Family history of stroke: Secondary | ICD-10-CM

## 2012-04-25 DIAGNOSIS — R29898 Other symptoms and signs involving the musculoskeletal system: Secondary | ICD-10-CM

## 2012-04-25 DIAGNOSIS — Z882 Allergy status to sulfonamides status: Secondary | ICD-10-CM

## 2012-04-25 DIAGNOSIS — I10 Essential (primary) hypertension: Secondary | ICD-10-CM

## 2012-04-25 DIAGNOSIS — Z9849 Cataract extraction status, unspecified eye: Secondary | ICD-10-CM

## 2012-04-25 DIAGNOSIS — H409 Unspecified glaucoma: Secondary | ICD-10-CM

## 2012-04-25 DIAGNOSIS — K5900 Constipation, unspecified: Secondary | ICD-10-CM

## 2012-04-25 DIAGNOSIS — I749 Embolism and thrombosis of unspecified artery: Secondary | ICD-10-CM

## 2012-04-25 LAB — CBC
MCH: 26.4 pg (ref 26.0–34.0)
MCHC: 32.9 g/dL (ref 30.0–36.0)
MCV: 80.2 fL (ref 78.0–100.0)
Platelets: 262 10*3/uL (ref 150–400)
RDW: 14.3 % (ref 11.5–15.5)

## 2012-04-25 LAB — CREATININE, SERUM: Creatinine, Ser: 1.12 mg/dL — ABNORMAL HIGH (ref 0.50–1.10)

## 2012-04-25 MED ORDER — DORZOLAMIDE HCL-TIMOLOL MAL 2-0.5 % OP SOLN
1.0000 [drp] | Freq: Two times a day (BID) | OPHTHALMIC | Status: DC
  Administered 2012-04-25 – 2012-05-05 (×20): 1 [drp] via OPHTHALMIC
  Filled 2012-04-25: qty 10

## 2012-04-25 MED ORDER — DABIGATRAN ETEXILATE MESYLATE 75 MG PO CAPS
75.0000 mg | ORAL_CAPSULE | Freq: Two times a day (BID) | ORAL | Status: DC
  Filled 2012-04-25: qty 1

## 2012-04-25 MED ORDER — SORBITOL 70 % SOLN: 30.0000 mL | Freq: Every day | Status: DC | PRN

## 2012-04-25 MED ORDER — DABIGATRAN ETEXILATE MESYLATE 75 MG PO CAPS
75.0000 mg | ORAL_CAPSULE | Freq: Two times a day (BID) | ORAL | Status: DC
  Administered 2012-04-25 – 2012-05-05 (×20): 75 mg via ORAL
  Filled 2012-04-25 (×22): qty 1

## 2012-04-25 MED ORDER — ACETAMINOPHEN 325 MG PO TABS
325.0000 mg | ORAL_TABLET | ORAL | Status: DC | PRN
  Administered 2012-05-03 – 2012-05-04 (×2): 650 mg via ORAL
  Filled 2012-04-25 (×2): qty 2

## 2012-04-25 MED ORDER — PANTOPRAZOLE SODIUM 40 MG PO TBEC
40.0000 mg | DELAYED_RELEASE_TABLET | Freq: Every day | ORAL | Status: DC
  Administered 2012-04-26 – 2012-05-05 (×10): 40 mg via ORAL
  Filled 2012-04-25 (×10): qty 1

## 2012-04-25 MED ORDER — ONDANSETRON HCL 4 MG PO TABS: 4.0000 mg | ORAL_TABLET | Freq: Four times a day (QID) | ORAL | Status: DC | PRN

## 2012-04-25 MED ORDER — LATANOPROST 0.005 % OP SOLN
1.0000 [drp] | Freq: Every day | OPHTHALMIC | Status: DC
  Administered 2012-04-25 – 2012-05-04 (×10): 1 [drp] via OPHTHALMIC
  Filled 2012-04-25: qty 2.5

## 2012-04-25 MED ORDER — SENNOSIDES-DOCUSATE SODIUM 8.6-50 MG PO TABS
1.0000 | ORAL_TABLET | Freq: Every evening | ORAL | Status: DC | PRN
  Filled 2012-04-25: qty 1

## 2012-04-25 MED ORDER — PNEUMOCOCCAL VAC POLYVALENT 25 MCG/0.5ML IJ INJ
0.5000 mL | INJECTION | INTRAMUSCULAR | Status: AC
  Administered 2012-04-26: 0.5 mL via INTRAMUSCULAR
  Filled 2012-04-25: qty 0.5

## 2012-04-25 MED ORDER — HYDROCHLOROTHIAZIDE 12.5 MG PO CAPS
12.5000 mg | ORAL_CAPSULE | Freq: Every day | ORAL | Status: DC
  Administered 2012-04-26 – 2012-04-27 (×2): 12.5 mg via ORAL
  Filled 2012-04-25 (×3): qty 1

## 2012-04-25 MED ORDER — ONDANSETRON HCL 4 MG/2ML IJ SOLN: 4.0000 mg | Freq: Four times a day (QID) | INTRAMUSCULAR | Status: DC | PRN

## 2012-04-25 MED ORDER — CALCIUM CARBONATE ANTACID 500 MG PO CHEW
1.0000 | CHEWABLE_TABLET | Freq: Three times a day (TID) | ORAL | Status: DC | PRN
  Administered 2012-04-26 (×3): 200 mg via ORAL
  Filled 2012-04-25 (×5): qty 1

## 2012-04-25 MED ORDER — ADULT MULTIVITAMIN W/MINERALS CH
1.0000 | ORAL_TABLET | Freq: Every day | ORAL | Status: DC
  Administered 2012-04-26 – 2012-05-05 (×10): 1 via ORAL
  Filled 2012-04-25 (×11): qty 1

## 2012-04-25 MED ORDER — IRBESARTAN 300 MG PO TABS
300.0000 mg | ORAL_TABLET | Freq: Every day | ORAL | Status: DC
  Administered 2012-04-26 – 2012-05-05 (×7): 300 mg via ORAL
  Filled 2012-04-25 (×11): qty 1

## 2012-04-25 MED ORDER — SIMVASTATIN 20 MG PO TABS
20.0000 mg | ORAL_TABLET | Freq: Every day | ORAL | Status: DC
  Administered 2012-04-25 – 2012-05-04 (×10): 20 mg via ORAL
  Filled 2012-04-25 (×11): qty 1

## 2012-04-25 NOTE — H&P (Signed)
Physical Medicine and Rehabilitation Admission H&P    Chief Complaint  Patient presents with  . Weakness  : HPI: Sandra Larson is a 79 y.o. right hand female with history of hypertension who lives alone in [place]. She uses a rolling walker in her home for ambulation. Admitted 04/21/2012 with progressive left-sided weakness over a seven-day period. MRI of the brain showed multifocal areas of acute cerebral infarct. These lie within the right MCA territory. 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 Patient did not receive TPA. Echocardiogram with ejection fraction of 65-70% without emboli. Carotid Dopplers with right 40-59% ICA stenosis. TEE showed no thrombus or vegetation.. Neurology services consulted maintained on Plavix therapy for stroke prophylaxis as well as subcutaneous heparin for DVT prophylaxis. Question atrial flutter 04/25/2012 at 5:35 AM EKG strip was not available. Contact  to neurology services as well as consult to cardiology Dr. Jacinto Halim and advised to change Plavix to her Pradaxa. Her subcutaneous heparin was discontinued once Pradaxa was initiated. Patient remained asymptomatic. Physical and occupational therapy evaluation completed 04/22/2012 an ongoing. M.D. is requested physical medicine rehabilitation consult to consider inpatient rehabilitation services. Patient was felt to be a good candidate for inpatient rehabilitation services and was admitted for comprehensive rehabilitation program  Review of Systems  Gastrointestinal: Positive for constipation.  Reflux  Musculoskeletal: Positive for myalgias and joint pain.  All other systems reviewed and are negative   Past Medical History  Diagnosis Date  . Hypertension   . Glaucoma    Past Surgical History  Procedure Laterality Date  . Appendectomy    . Cataract extraction    . Esophagogastroduodenoscopy (egd) with esophageal dilation    . Tee without  cardioversion N/A 04/24/2012    Procedure: TRANSESOPHAGEAL ECHOCARDIOGRAM (TEE);  Surgeon: Thurmon Fair, MD;  Location: [facility] ENDOSCOPY;  Service: Cardiovascular;  Laterality: N/A;   Family History  Problem Relation Age of Onset  . Stroke Mother   . Stroke Father    Social History:  reports that she has never smoked. She does not have any smokeless tobacco history on file. She reports that she does not drink alcohol or use illicit drugs. Allergies:  Allergies  Allergen Reactions  . Penicillins     chilhood  . Sulfa Antibiotics Rash   Medications Prior to Admission  Medication Sig Dispense Refill  . dorzolamide-timolol (COSOPT) 22.3-6.8 MG/ML ophthalmic solution Place 1 drop into both eyes 2 (two) times daily.      Marland Kitchen latanoprost (XALATAN) 0.005 % ophthalmic solution Place 1 drop into both eyes at bedtime.      . Multiple Vitamin (MULTIVITAMIN WITH MINERALS) TABS Take 1 tablet by mouth daily.      . valsartan-hydrochlorothiazide (DIOVAN-HCT) 320-12.5 MG per tablet Take 1 tablet by mouth daily.        Home: Home Living Lives With: Alone Available Help at Discharge: Family;Available PRN/intermittently Type of Home: House Home Access: Stairs to enter [employer] of Steps: 3 Entrance Stairs-Rails: Left;Right Home Layout: One level Bathroom Shower/Tub: Walk-in shower;Door (sponge bath) Bathroom Toilet: Standard Home Adaptive Equipment: Walker - rolling;Straight cane (started using RW few weeks ago, cane previous)   Functional History: Prior Function Able to Take Stairs?: Yes Driving: No Vocation: Retired  Functional Status:  Mobility: Bed Mobility Bed Mobility: Not assessed Rolling Left: 5: Supervision;With rail Left Sidelying to Sit: 5: Supervision;HOB flat Supine to Sit: 4: Min assist Sitting - Scoot to Edge of Bed:  4: Min guard Transfers Transfers: Sit to Stand;Stand to Sit Sit to Stand: 4: Min assist;From chair/3-in-1;With armrests;With upper extremity  assist;From toilet Stand to Sit: 4: Min assist;To chair/3-in-1;With armrests;With upper extremity assist;To toilet Ambulation/Gait Ambulation/Gait Assistance: 4: Min assist Ambulation Distance (Feet): 160 Feet (seated rest after 80 ft) Assistive device: Rolling walker Ambulation/Gait Assistance Details: initial cuing for keeping RW closer to her and for incr UE extension/support when Rt knee buckles; RW height adjusted prior to 2nd walk and better able to utilize UEs safely; pt with Rt knee buckling x 6-8 times due to pain/weakness (from prior injury per pt) Gait Pattern: Step-to pattern;Decreased stride length;Right flexed knee in stance;Antalgic;Trunk flexed Gait velocity: decreased General Gait Details: steady with assist to support hand on rw (R), Max VCs to attend to R hand placement and positioning; VCs for body positioning within rw Stairs: No    ADL: ADL Grooming: Performed;Wash/dry hands;Min guard Where Assessed - Grooming: Supported standing Upper Body Bathing: Moderate assistance Upper Body Dressing: Performed;Moderate assistance Where Assessed - Upper Body Dressing: Unsupported sitting Toilet Transfer: Performed;Minimal assistance Toilet Transfer Method: Sit to stand Toilet Transfer Equipment: Regular height toilet Equipment Used: Rolling walker Transfers/Ambulation Related to ADLs: verbal cues to keep L hand on RW ADL Comments: Pt not wearing L hand splint at start of tx, OT donned splint and pt states that she felt that staff had put it on too tightly before and that it made her thumb swell so the Dr removed it yesterday. OT able to get 2 fingers under splint after donning so not too tight, pt stated that it felt better. Pt wanted splint off at end of session. OT positioned pt's L UE in elevation while pt seated in recliner  Cognition: Cognition Overall Cognitive Status: Appears within functional limits for tasks assessed Arousal/Alertness: Awake/alert Orientation Level:  Oriented X4 Memory: Appears intact Awareness: Appears intact Problem Solving: Appears intact Safety/Judgment: Appears intact Cognition Overall Cognitive Status: Appears within functional limits for tasks assessed/performed Arousal/Alertness: Awake/alert Orientation Level: Appears intact for tasks assessed Behavior During Session: [facility] for tasks performed Cognition - Other Comments: decr awareness to new deficits  Physical Exam: Blood pressure 126/68, pulse 86, temperature 97 F (36.1 C), temperature source Oral, resp. rate 18, height 5\' 5"  (1.651 m), weight 54.432 kg (120 lb), SpO2 99.00%. Physical Exam  Vitals reviewed.  Constitutional: She is oriented to person, place, and time.  HENT: oral mucosa generally pink and moist.  Head: Normocephalic.  Eyes: EOM are normal.  Neck: Normal range of motion. Neck supple. No thyromegaly present.  Cardiovascular: Normal rate and regular rhythm. No murmurs, or gallops Pulmonary/Chest: Effort normal and breath sounds normal. No respiratory distress. No wheezes or rales. Abdominal: Soft. Bowel sounds are normal. She exhibits distension. There is no tenderness.  Musculoskeletal: She exhibits no edema. No pain with ROM Neurological: She is alert and oriented to person, place, and time. She displays normal reflexes. She exhibits normal muscle tone. Coordination abnormal.  Follows full commands. LUE is 2+ deltoid, 3 to 3+ elbow, wrist. , 3+ to 4- HI. LLE grossly 3+ to 4/5. No gross sensory deficits. Good insight and awareness. No apparent visual spatial deficits. Wearing splint over left wrist. Skin: Skin is warm and dry.  Psychiatric: She has a normal mood and affect. Her behavior is normal. Judgment and thought content normal   No results found for this or any previous visit (from the past 48 hour(s)). No results found.  Post Admission Physician Evaluation: 1. Functional  deficits secondary  to embolic right MCA infarct. 2. Patient is admitted to  receive collaborative, interdisciplinary care between the physiatrist, rehab nursing staff, and therapy team. 3. Patient's level of medical complexity and substantial therapy needs in context of that medical necessity cannot be provided at a lesser intensity of care such as a SNF. 4. Patient has experienced substantial functional loss from his/her baseline which was documented above under the "Functional History" and "Functional Status" headings.  Judging by the patient's diagnosis, physical exam, and functional history, the patient has potential for functional progress which will result in measurable gains while on inpatient rehab.  These gains will be of substantial and practical use upon discharge  in facilitating mobility and self-care at the household level. 5. Physiatrist will provide 24 hour management of medical needs as well as oversight of the therapy plan/treatment and provide guidance as appropriate regarding the interaction of the two. 6. 24 hour rehab nursing will assist with bladder management, bowel management, safety, skin/wound care, disease management, medication administration and patient education  and help integrate therapy concepts, techniques,education, etc. 7. PT will assess and treat for/with: Lower extremity strength, range of motion, stamina, balance, functional mobility, safety, adaptive techniques and equipment, NMR, education.   Goals are: mod I. 8. OT will assess and treat for/with: ADL's, functional mobility, safety, upper extremity strength, adaptive techniques and equipment, NMR, education.   Goals are: mod I to supervision. 9. SLP will assess and treat for/with: n/a.  Goals are: n/a. 10. Case Management and Social Worker will assess and treat for psychological issues and discharge planning. 11. Team conference will be held weekly to assess progress toward goals and to determine barriers to discharge. 12. Patient will receive at least 3 hours of therapy per day at least 5  days per week. 13. ELOS: 7-10 days      Prognosis:  excellent   Medical Problem List and Plan: 1. Suspected embolic Right MCA infarct 2. DVT Prophylaxis/Anticoagulation: Subcutaneous heparin discontinued today after Pradaxa initiated. Monitor for any signs of bleeding. Minimize fall risks 3. Neuropsych: This patient is capable of making decisions on his/her own behalf. 4. Hypertension/atrial flutter. Avapro 300 mg daily, hydrochlorothiazide 12.5 mg daily. Closely monitor bp/hr with increased mobility.Pradaxa initiated for atrial flutter as well as stroke prophylaxis.   -adjust regimen as needed 5. Hyperlipidemia. Zocor 6. Glaucoma. Continue eye drops as advised    Ranelle Oyster, MD, [place]  04/25/2012

## 2012-04-25 NOTE — Progress Notes (Signed)
Patient admitted to CIR at 1557. Alert and oriented x4, denies pain. Oriented to unit, rehab schedule, call bell system, and safety plan. Stroke education packet given. Order placed for PNA vaccine per protocol, VIS given to patient. Patient verbalized understanding of admission information given, no further questions at this time. Hedy Camara

## 2012-04-25 NOTE — PMR Pre-admission (Signed)
PMR Admission Coordinator Pre-Admission Assessment  Patient: Sandra Larson is an 79 y.o., female MRN: [id number] DOB: 1913-04-03 Height: 5\' 5"  (165.1 cm) Weight: 54.432 kg (120 lb)              Insurance Information HMO:      PPO:       PCP:       IPA:       80/20:       OTHER:   PRIMARY: Medicare A/B      Policy#: [id number]      Subscriber: Bunnie Pion CM Name:        Phone#:       Fax#:   Pre-Cert#:        Employer: Retired Benefits:  Phone #:       Name: Armed forces technical officer. Date: 10/18/80     Deduct: $1216      Out of Pocket Max: none      Life Max: unlimited CIR: 100%      SNF: 100 days Outpatient: 80%     Co-Pay: 20% Home Health: 100%      Co-Pay: none DME: 80%     Co-Pay: 20% Providers: patient's choice  SECONDARY: [employer]      Policy#:        Subscriber: Bunnie Pion  CM Name:        Phone#:       Fax#:   Pre-Cert#:        Employer: Retired   Benefits:  Phone #:       Name:   Eff. Date:       Deduct:        Out of Pocket Max:        Life Max:   CIR:        SNF:   Outpatient:       Co-Pay:   Home Health:        Co-Pay:   DME:       Co-Pay:     Emergency Contact Information Contact Information   Name Relation Home Work Mobile   [place] Daughter [phone]  [phone]   Moser,Jennifer Grandaughter   [phone]   Steward Ros   [phone]     Current Medical History  Patient Admitting Diagnosis:  R MCA infarct  History of Present Illness: A 79 y.o. right hand female with history of hypertension who lives alone in [place]. She uses a rolling walker in her home for ambulation. Admitted 04/21/2012 with progressive left-sided weakness over a seven-day period. MRI of the brain showed multifocal areas of acute cerebral infarct. These lie within the right MCA territory. 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 Patient did not receive TPA. Echocardiogram with ejection  fraction of 65-70% without emboli. Carotid Dopplers with right 40-59% ICA stenosis. TEE was pending. Neurology services consulted maintained on Plavix therapy for stroke prophylaxis as well as subcutaneous heparin for DVT prophylaxis. Physical and occupational therapy evaluation completed 04/22/2012 an ongoing. M.D. is requested physical medicine rehabilitation consult to consider inpatient rehabilitation services   Total: 2=NIH  Past Medical History  Past Medical History  Diagnosis Date  . Hypertension   . Glaucoma     Family History  family history includes Stroke in her father and mother.  Prior Rehab/Hospitalizations:  Had outpatient therapy in [place] after R leg fracture in "87.   Current Medications  Current facility-administered medications:alum & mag hydroxide-simeth (MAALOX/MYLANTA) 200-200-20  MG/5ML suspension 15 mL, 15 mL, Oral, Q6H PRN, Pamella Pert, MD, 15 mL at 04/23/12 2124;  clopidogrel (PLAVIX) tablet 75 mg, 75 mg, Oral, Q breakfast, Marinda Elk, MD, 75 mg at 04/25/12 1005;  dorzolamide-timolol (COSOPT) 22.3-6.8 MG/ML ophthalmic solution 1 drop, 1 drop, Both Eyes, BID, Marinda Elk, MD, 1 drop at 04/24/12 2152 heparin injection 5,000 Units, 5,000 Units, Subcutaneous, Q8H, Marinda Elk, MD, 5,000 Units at 04/25/12 [phone];  hydrALAZINE (APRESOLINE) injection 5 mg, 5 mg, Intravenous, Q4H PRN, Marinda Elk, MD;  hydrochlorothiazide (MICROZIDE) capsule 12.5 mg, 12.5 mg, Oral, Daily, Marinda Elk, MD, 12.5 mg at 04/25/12 1005;  irbesartan (AVAPRO) tablet 300 mg, 300 mg, Oral, Daily, Marinda Elk, MD, 300 mg at 04/25/12 1005 latanoprost (XALATAN) 0.005 % ophthalmic solution 1 drop, 1 drop, Both Eyes, QHS, Marinda Elk, MD, 1 drop at 04/24/12 2152;  multivitamin with minerals tablet 1 tablet, 1 tablet, Oral, Daily, Marinda Elk, MD, 1 tablet at 04/25/12 1005;  ondansetron (ZOFRAN) tablet 8 mg, 8 mg, Oral, Q8H PRN, Violet Baldy  Reidler, PA-C;  senna-docusate (Senokot-S) tablet 1 tablet, 1 tablet, Oral, QHS PRN, Marinda Elk, MD simvastatin (ZOCOR) tablet 20 mg, 20 mg, Oral, q1800, Marinda Elk, MD, 20 mg at 04/24/12 1716  Patients Current Diet: Cardiac  Precautions / Restrictions Precautions Precautions: Fall Precaution Comments: pt with prior injury to Rt knee with occasional buckling Other Brace/Splint: Lt wrist Restrictions Weight Bearing Restrictions: No   Prior Activity Level Limited Community (1-2x/wk): Went out 1-2 days a week.  Home Assistive Devices / Equipment Home Assistive Devices/Equipment: None Home Adaptive Equipment: Walker - rolling;Straight cane (started using RW few weeks ago, cane previous)  Prior Functional Level Prior Function Level of Independence: Independent Able to Take Stairs?: Yes Driving: No Vocation: Retired  Current Functional Level Cognition  Arousal/Alertness: Awake/alert Overall Cognitive Status: Appears within functional limits for tasks assessed Overall Cognitive Status: Appears within functional limits for tasks assessed/performed Orientation Level: Oriented X4 Cognition - Other Comments: decr awareness to new deficits Memory: Appears intact Awareness: Appears intact Problem Solving: Appears intact Safety/Judgment: Appears intact    Extremity Assessment (includes Sensation/Coordination)  RUE ROM/Strength/Tone: WFL for tasks assessed;Within functional levels RUE Sensation: WFL - Light Touch RUE Coordination: WFL - gross/fine motor  RLE ROM/Strength/Tone: WFL for tasks assessed;Deficits RLE ROM/Strength/Tone Deficits: generalized weakness RLE Sensation: WFL - Light Touch;WFL - Proprioception RLE Coordination: WFL - gross motor    ADLs  Grooming: Performed;Wash/dry hands;Min guard Where Assessed - Grooming: Supported standing Upper Body Bathing: Moderate assistance Upper Body Dressing: Performed;Moderate assistance Where Assessed - Upper  Body Dressing: Unsupported sitting Toilet Transfer: Performed;Minimal assistance Toilet Transfer Method: Sit to stand Toilet Transfer Equipment: Regular height toilet Toileting - Clothing Manipulation and Hygiene: Performed;Minimal assistance Where Assessed - Toileting Clothing Manipulation and Hygiene: Sit on 3-in-1 or toilet Equipment Used: Rolling walker Transfers/Ambulation Related to ADLs: verbal cues to keep L hand on RW ADL Comments: Pt not wearing L hand splint at start of tx, OT donned splint and pt states that she felt that staff had put it on too tightly before and that it made her thumb swell so the Dr removed it yesterday. OT able to get 2 fingers under splint after donning so not too tight, pt stated that it felt better. Pt wanted splint off at end of session. OT positioned pt's L UE in elevation while pt seated in recliner    Mobility  Bed Mobility: Not assessed Rolling  Left: 5: Supervision;With rail Left Sidelying to Sit: 5: Supervision;HOB flat Supine to Sit: 4: Min assist Sitting - Scoot to Edge of Bed: 4: Min guard    Transfers  Transfers: Sit to Stand;Stand to Sit Sit to Stand: 4: Min assist;From chair/3-in-1;With armrests;With upper extremity assist;From toilet Stand to Sit: 4: Min assist;To chair/3-in-1;With armrests;With upper extremity assist;To toilet    Ambulation / Gait / Stairs / Wheelchair Mobility  Ambulation/Gait Ambulation/Gait Assistance: 4: Min assist Ambulation Distance (Feet): 160 Feet (seated rest after 80 ft) Assistive device: Rolling walker Ambulation/Gait Assistance Details: initial cuing for keeping RW closer to her and for incr UE extension/support when Rt knee buckles; RW height adjusted prior to 2nd walk and better able to utilize UEs safely; pt with Rt knee buckling x 6-8 times due to pain/weakness (from prior injury per pt) Gait Pattern: Step-to pattern;Decreased stride length;Right flexed knee in stance;Antalgic;Trunk flexed Gait velocity:  decreased General Gait Details: steady with assist to support hand on rw (R), Max VCs to attend to R hand placement and positioning; VCs for body positioning within rw Stairs: No    Posture / Balance Dynamic Standing Balance Dynamic Standing - Balance Support: No upper extremity supported;During functional activity (washing hands at sink) Dynamic Standing - Level of Assistance: 4: Min assist Dynamic Standing - Comments: reaching forward for faucet, laterally for soap and towels High Level Balance High Level Balance Activites: Turns High Level Balance Comments: needs (A) to turn RW    Special needs/care consideration BiPAP/CPAP No CPM No Continuous Drip IV No Dialysis No     Life Vest No Oxygen No Special Bed No Trach Size No Wound Vac (area) No      Skin No                              Bowel mgmt: Had BM 04/24/12 Bladder mgmt:Voiding on BSC and in bathroom Diabetic mgmt No     Previous Home Environment Living Arrangements: Other relatives Lives With: Alone Available Help at Discharge: Family;Available PRN/intermittently Type of Home: House Home Layout: One level Home Access: Stairs to enter Entrance Stairs-Rails: Lawyer of Steps: 3 Bathroom Shower/Tub: Psychologist, counselling;Door (sponge bath) Bathroom Toilet: Standard Home Care Services: No  Discharge Living Setting Plans for Discharge Living Setting: Patient's home;Alone;House Type of Home at Discharge: House Discharge Home Layout: One level Discharge Home Access: Stairs to enter Entrance Stairs-Number of Steps: 3 Do you have any problems obtaining your medications?: No  Social/Family/Support Systems Patient Roles: Parent Contact Information: Georgiana Shore - daughter Anticipated Caregiver: Dtr and granddaughters, neighbor/friend Anticipated Caregiver's Contact Information: Dois Davenport - dtr (h) [phone] (c) [phone] Ability/Limitations of Caregiver: Dtr cannot do heavy lifting.   Engineer, building services currently unemployed and can assist.) Caregiver Availability: Intermittent Discharge Plan Discussed with Primary Caregiver: Yes Is Caregiver In Agreement with Plan?: Yes Does Caregiver/Family have Issues with Lodging/Transportation while Pt is in Rehab?: No  Goals/Additional Needs Patient/Family Goal for Rehab: PT mod I, OT mod I to Supervision, no ST needs Expected length of stay: 7-10 days Cultural Considerations: None Dietary Needs: Heart diet Equipment Needs: TBD Pt/Family Agrees to Admission and willing to participate: Yes Program Orientation Provided & Reviewed with Pt/Caregiver Including Roles  & Responsibilities: Yes   Decrease burden of Care through IP rehab admission:  Not applicable  Possible need for SNF placement upon discharge: Likely will not need SNF   Patient Condition: This patient's condition remains as documented  in the consult dated 04/25/12, in which the Rehabilitation Physician determined and documented that the patient's condition is appropriate for intensive rehabilitative care in an inpatient rehabilitation facility. Will admit to inpatient rehab today.  Preadmission Screen Completed By:  Trish Mage, 04/25/2012 12:06 PM ______________________________________________________________________   Discussed status with Dr. Riley Kill on 04/25/12 at 1218 and received telephone approval for admission today.  Admission Coordinator:  Trish Mage, time1218/Date04/09/14

## 2012-04-25 NOTE — Progress Notes (Signed)
Rehab admissions - Evaluated for possible admission.  I met with patient, dtr and granddaughter at the bedside.  Gave them rehab brochures and explained acute inpatient rehab.  Patient had a run of PAF this am and Dr. Isidoro Donning is following up with neurology.  Likely will be able to admit to acute inpatient rehab later today.  Call me for questions.  #[phone]

## 2012-04-25 NOTE — Discharge Summary (Signed)
Physician Discharge Summary  Patient ID: PAMMY VESEY MRN: [id number] DOB/AGE: [birth year] 79 y.o.  Admit date: 04/21/2012 Discharge date: 04/25/2012  Primary Care Physician:  Dina Rich, MD  Discharge Diagnoses:    . CVA (cerebral infarction) . HTN (hypertension) . paroxysmal Atrial fibrillation found during the hospitalization on the tele   Consults: Neurology                   CAR, inpatient rehabilitation                    Cardiology, Dr.Croitoru   Discharge Medications:   Medication List    TAKE these medications       dorzolamide-timolol 22.3-6.8 MG/ML ophthalmic solution  Commonly known as:  COSOPT  Place 1 drop into both eyes 2 (two) times daily.     latanoprost 0.005 % ophthalmic solution  Commonly known as:  XALATAN  Place 1 drop into both eyes at bedtime.     multivitamin with minerals Tabs  Take 1 tablet by mouth daily.     valsartan-hydrochlorothiazide 320-12.5 MG per tablet  Commonly known as:  DIOVAN-HCT  Take 1 tablet by mouth daily.       Inpatient medications to be continued at the time of final discharge: Pradaxa 75 mg twice a day Zocor 20 mg daily  Brief H and P: For complete details please refer to admission H and P, but in brief Sandra Larson is a 79 y.o. female  With past medical history of hypertension, she relates she felt her arm felt weak one week prior to admission progressively getting worse with numbness and dropping things. Patient unable to hold her arm up. She also reported that she was having trouble with buttoning her shirt.    Hospital Course: The patient is a 79 year old female with history of hypertension, otherwise very functional with her ADLs, presented with left upper extremity weakness. Patient was admitted for post stroke workup. CVA (cerebral infarction):   MRI brain showed multifocal areas of infarction in the right MCA territory, shower of emboli is not excluded. For details please see the results below.   MRA showed 4x58mm anterior communicating artery aneurysm, unruptured. Carotid Dopplers showed right 40-59% ICA stenosis left no evidence of significant ICA stenosis. 2-D echo showed EF of 65-70%, no cardiac source of emboli. Cardiology was consulted and patient underwent TEE, done on 04/24/2012 showed normal LV size and function, and no intracardiac thrombus/medication, moderate ulcerated plaque in the aortic arch. Patient was initially placed on Plavix 75 mg daily for secondary stroke prevention. On the morning of 04/25/2012 patient was noted to have paroxysmal atrial flutter on telemetry monitor, asymptomatic.  Telemetry monitor strip reviewed with Dr. Jacinto Halim also confirmed atrial flutter. I spoke to the patient and her family as well as neurology, Dr. Pearlean Brownie and patient was explained the risk and benefits of stroke prevention with anticoagulation as well as increased risk of bleeding given the patient's age. The patient was recommended Pradaxa by neurology service and was started today. Plavix was discontinued. Patient was accepted by inpatient rehabilitation.   Paroxysmal A. fib/flutter this morning  - transient, placed on Pradaxa.    Day of Discharge BP 105/54  Pulse 87  Temp(Src) 98.5 F (36.9 C) (Oral)  Resp 18  Ht 5\' 5"  (1.651 m)  Wt 54.432 kg (120 lb)  BMI 19.97 kg/m2  SpO2 97%  Physical Exam: General: Alert and awake oriented x3 not in any acute distress. CVS: S1-S2 clear no  murmur rubs or gallops Chest: clear to auscultation bilaterally, no wheezing rales or rhonchi Abdomen: soft nontender, nondistended, normal bowel sounds Extremities: no cyanosis, clubbing or edema noted bilaterally   The results of significant diagnostics from this hospitalization (including imaging, microbiology, ancillary and laboratory) are listed below for reference.    LAB RESULTS: Basic Metabolic Panel:  Recent Labs Lab 04/21/12 0852 04/21/12 0909 04/21/12 1315  NA 137 140  --   K 3.7 3.8  --    CL 99 100  --   CO2 26  --   --   GLUCOSE 104* 105*  --   BUN 21 22  --   CREATININE 1.04 1.10 1.03  CALCIUM 9.7  --   --    Liver Function Tests:  Recent Labs Lab 04/21/12 0852  AST 21  ALT 11  ALKPHOS 61  BILITOT 0.3  PROT 7.2  ALBUMIN 3.9   No results found for this basename: LIPASE, AMYLASE,  in the last 168 hours No results found for this basename: AMMONIA,  in the last 168 hours CBC:  Recent Labs Lab 04/21/12 0852 04/21/12 0909 04/21/12 1315  WBC 6.0  --  6.6  NEUTROABS 4.3  --   --   HGB 12.2 13.9 12.2  HCT 38.6 41.0 37.0  MCV 82.1  --  81.7  PLT 260  --  261   Cardiac Enzymes:  Recent Labs Lab 04/21/12 0852  TROPONINI <0.30   BNP: No components found with this basename: POCBNP,  CBG:  Recent Labs Lab 04/21/12 0858  GLUCAP 104*    Significant Diagnostic Studies:  Dg Chest 2 View  04/21/2012  *RADIOLOGY REPORT*  Clinical Data: Stroke  CHEST - 2 VIEW  Comparison: None.  Findings: The cardiac silhouette is enlarged.  There is kyphosis and scoliosis of the spine.  There are low lung volumes.  No effusion, infiltrate, or pneumothorax.  There is underlying chronic interstitial lung disease.  IMPRESSION:  1.  No clear acute findings.  2.  Chronic interstitial lung disease. 3.  Cardiomegaly.   Original Report Authenticated By: Genevive Bi, M.D.    Ct Head Wo Contrast  04/21/2012  *RADIOLOGY REPORT*  Clinical Data: Left arm weakness and numbness with pain in the loss of dexterity, symptoms began 04/12/2012, today unable to lift left arm; history hypertension  CT HEAD WITHOUT CONTRAST  Technique:  Contiguous axial images were obtained from the base of the skull through the vertex without contrast.  Comparison: None  Findings: Generalized atrophy. Normal ventricular morphology. No midline shift or mass effect. Tiny sulcal calcifications right parietal, nonspecific. Question small old infarct versus volume averaging artifact at right parietal lobe. Otherwise  normal appearance of brain parenchyma. No intracranial hemorrhage, mass lesion or evidence of acute infarction. No extra-axial fluid collections. Visualized paranasal sinuses and mastoid air cells clear. Extensive atherosclerotic calcifications of internal carotid arteries at skull base. No acute osseous findings.  IMPRESSION: Generalized atrophy. Question small old infarct versus volume averaging artifact at right parietal lobe. No acute intracranial abnormalities.   Original Report Authenticated By: Ulyses Southward, M.D.    Mr Brain Wo Contrast  04/21/2012  *RADIOLOGY REPORT*  Clinical Data:  Left arm weakness.  Stroke risk factors include previous history of stroke and hypertension.  MRI HEAD WITHOUT CONTRAST MRA HEAD WITHOUT CONTRAST  Technique:  Multiplanar, multiecho pulse sequences of the brain and surrounding structures were obtained without intravenous contrast. Angiographic images of the head were obtained using MRA technique without contrast.  Comparison:  CT head earlier in the day.  MRI HEAD  Findings:  Multifocal areas of acute infarction affect the right frontal cortex, right posterior frontal cortex, right parietal cortex, and adjacent subcortical white matter.  There is no visible associated hemorrhage.  Moderate age related atrophy is noted.  Chronic microvascular ischemic change affects the periventricular greater than subcortical white matter.  No significant chronic large vessel infarct.  Gyriform T1 shortening in the right occipital lobe could represent a prior post traumatic subpial hemorrhage, or prior ischemic insult with reperfusion.  No significant encephalomalacia.  No midline shift.  Dolichoectatic cerebral vasculature. Bulbous ACA abnormality, described below.  Pituitary and cerebellar tonsils unremarkable.  Mild cervical spondylosis.  No worrisome osseous lesions.  Clear sinuses and mastoids.  IMPRESSION: Multifocal areas of acute cerebral infarction as described.  These lie within the  right MCA territory, and given their multiplicity and locations, a shower of emboli is not excluded.  Gyriform foci of T1 shortening affects the right occipital lobe without associated cerebral volume loss.  This could represent previous subpial hemorrhage or prior ischemic episode with reperfusion.  There is no frank lobar hemorrhage.  No significant parenchymal volume loss is noted. Mr Maxine Glenn Head/brain Wo Cm   04/21/12:   MRA HEAD  Findings: The internal carotid arteries are widely patent.  The basilar artery is widely patent left vertebral dominant.  Right vertebral is patent.  No proximal MCA stenosis is seen.  Both anterior cerebral arteries are patent.  There is mild nonstenotic irregularity both posterior cerebrals.  No cerebellar branch occlusion is present.  There is a 4 x 7 mm anterior communicating artery aneurysm at the junction of the left A1 and ACA.  This appears unruptured.  The aneurysm is wide necked.  IMPRESSION: No flow limiting intracranial stenosis.  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  This appears unruptured.   Original Report Authenticated By: Davonna Belling, M.D.     2D Echocardiogram ejection fraction 65-70%. No cardiac source of emboli identified  TEE No SOE, no PFO, no vegetations, loop recorder recommended   Disposition and Follow-up:    DISPOSITION: Inpatient rehabilitation  DIET: Heart healthy diet ACTIVITY: As tolerated   DISCHARGE FOLLOW-UP Follow-up Information   Follow up with DOUGH,ROBERT, MD. Schedule an appointment as soon as possible for a visit in 2 weeks.   Contact information:   [address] [place] [address] [phone]       Follow up with Gates Rigg, MD. Schedule an appointment as soon as possible for a visit in 2 months.   Contact information:   [address] [place] [address] [phone]       Time spent on Discharge: 40 mins  Signed:   Allecia Bells M.D. [facility] 04/25/2012, 3:49 PM Pager: [phone]

## 2012-04-25 NOTE — Progress Notes (Signed)
Monitor Tech called nurse around [phone] to notify pt having atrial flutter. Pt remains assymptomatic.  Temp 97, BP 126/68, HR 86, Resp 18 and 99% on RA. As per monitor tech pt is having flutter waves since 0130. K. Kirby/NP notified.STAT EKG done. Will continue to monitor.

## 2012-04-25 NOTE — Clinical Social Work Note (Signed)
Clinical Social Work  Pt will discharge to CIR when anticoagulation is clarified. Pt is agreeable to discharge plan. CSW confirmed admission with CIR admission coordinator. CSW is signing off at this time, as no further needs are identified.   Dede Query, MSW, LCSW [phone]

## 2012-04-25 NOTE — Interval H&P Note (Signed)
Sandra Larson was admitted today to Inpatient Rehabilitation with the diagnosis of embolic right MCA infarcts.  The patient's history has been reviewed, patient examined, and there is no change in status.  Patient continues to be appropriate for intensive inpatient rehabilitation.  I have reviewed the patient's chart and labs.  Questions were answered to the patient's satisfaction.  Sandra Larson T 04/25/2012, 11:24 PM

## 2012-04-25 NOTE — Progress Notes (Signed)
Physical Therapy Treatment Patient Details Name: Sandra Larson MRN: [id number] DOB: [birth year] Today's Date: 04/25/2012 Time: 0865-7846 PT Time Calculation (min): 31 min  PT Assessment / Plan / Recommendation Comments on Treatment Session  79 yo adm with Lt sided weakness with +CVA. Pt continues to progress with incr safety with ambulation this date.    Follow Up Recommendations  CIR     Does the patient have the potential to tolerate intense rehabilitation     Barriers to Discharge        Equipment Recommendations  None recommended by PT    Recommendations for Other Services    Frequency Min 4X/week   Plan Discharge plan remains appropriate;Frequency remains appropriate    Precautions / Restrictions Precautions Precautions: Fall Precaution Comments: pt with prior injury to Rt knee with occasional buckling Required Braces or Orthoses: Other Brace/Splint Other Brace/Splint: Lt wrist   Pertinent Vitals/Pain Denied wrist pain; continues with chronic Rt knee pain    Mobility  Transfers Sit to Stand: 3: Mod assist;With upper extremity assist Stand to Sit: 4: Min assist;To chair/3-in-1;With armrests;With upper extremity assist;To toilet Details for Transfer Assistance: x 3; incr assist from toilet; requires assist due to weakness and Rt knee pain Ambulation/Gait Ambulation/Gait Assistance: 4: Min assist Ambulation Distance (Feet): 160 Feet (80x2 with seated rest) Assistive device: Rolling walker Ambulation/Gait Assistance Details: assist to maintain Rt hand on walker and steer RW in straight path; less limping and no knee buckling this date; vc for staying closer to RW Gait Pattern: Step-to pattern;Decreased stride length;Right flexed knee in stance;Antalgic;Trunk flexed Gait velocity: decreased Modified Rankin (Stroke Patients Only) Pre-Morbid Rankin Score: Moderate disability Modified Rankin: Moderately severe disability    Exercises General Exercises - Lower  Extremity Ankle Circles/Pumps: AROM;Both;10 reps;Seated Long Arc Quad: AROM;Both;10 reps;Seated Hip Flexion/Marching: AROM;Both;10 reps;Seated   PT Diagnosis:    PT Problem List:   PT Treatment Interventions:     PT Goals Acute Rehab PT Goals Pt will go Sit to Stand: with supervision PT Goal: Sit to Stand - Progress: Progressing toward goal Pt will go Stand to Sit: with supervision PT Goal: Stand to Sit - Progress: Progressing toward goal Pt will Ambulate: >150 feet;with supervision PT Goal: Ambulate - Progress: Progressing toward goal  Visit Information  Last PT Received On: 04/25/12 Assistance Needed: +1    Subjective Data      Cognition  Cognition Overall Cognitive Status: Appears within functional limits for tasks assessed/performed    Balance     End of Session PT - End of Session Equipment Utilized During Treatment: Gait belt Activity Tolerance: Patient tolerated treatment well Patient left: in chair;with call bell/phone within reach;with family/visitor present Nurse Communication: Mobility status   GP     Stacy Sailer 04/25/2012, 3:36 PM  04/25/2012 Veda Canning, PT Pager: [phone]

## 2012-04-25 NOTE — Progress Notes (Signed)
Patient ID: Sandra Larson  female  ZOX:[id number]    DOB: [birth year]    DOA: 04/21/2012  PCP: Dina Rich, MD  Assessment/Plan: Principal Problem:   CVA (cerebral infarction):  - MRI with multifocal areas of infarction in the right MCA territory - MRA showed 4x42mm anterior communicating artery aneurysm, unruptured - Carotid Dopplers showed right 40-59% ICA stenosis left no evidence of significant ICA stenosis - 2-D echo showed EF of 65-70%, no cardiac source of emboli -TEE done on 04/24/2012 showed normal LV size and function, and no intracardiac thrombus/medication, moderate ulcerated plaque in the aortic arch - on Plavix 75 mg daily for secondary stroke prevention - Patient accepted for inpatient rehabilitation  Paroxysmal A. fib/flutter this morning - Telemetry monitor strip reviewed with Dr. Jacinto Halim also confirmed atrial flutter. Repeat EKG obtained at 6AM was normal sinus rhythm.  - I discussed in detail risks (fall/bleeding) and benefits (prevent recurrent stroke ) of anticoagulation/newer agents (pradaxa, xarelto) with the patient and her multiple  family members in the room. Patient has been functional with her ADLs prior to this admission however uses a walker for ambulation, lived alone prior to this admission however family members were checking up on her frequently. Patient and her family could not make any decision regarding anticoagulation, they will also like to hear from neurology. I called [facility] and relayed the message to Neurology service. I will defer the decision of anticoagulation to neurology service, will hold discharge to inpatient rehab until this is clarfified.      HTN (hypertension) - Currently stable  DVT Prophylaxis:  Code Status: DO NOT RESUSCITATE  Disposition: Inpatient rehab   Subjective: Sitting up in the chair, multiple family members in the room, no specific complaints   Objective: Weight change:  No intake or output data in the 24 hours  ending 04/25/12 1339 Blood pressure 120/51, pulse 88, temperature 98.1 F (36.7 C), temperature source Oral, resp. rate 18, height 5\' 5"  (1.651 m), weight 54.432 kg (120 lb), SpO2 97.00%.  Physical Exam: General: A x O x 3, not in any acute distress. CVS: S1-S2 clear, no murmur rubs or gallops Chest: CTAB Abdomen: soft NT, ND, NBS Extremities: no c/c/e bilaterally  Lab Results: Basic Metabolic Panel:  Recent Labs Lab 04/21/12 0852 04/21/12 0909 04/21/12 1315  NA 137 140  --   K 3.7 3.8  --   CL 99 100  --   CO2 26  --   --   GLUCOSE 104* 105*  --   BUN 21 22  --   CREATININE 1.04 1.10 1.03  CALCIUM 9.7  --   --    Liver Function Tests:  Recent Labs Lab 04/21/12 0852  AST 21  ALT 11  ALKPHOS 61  BILITOT 0.3  PROT 7.2  ALBUMIN 3.9   No results found for this basename: LIPASE, AMYLASE,  in the last 168 hours No results found for this basename: AMMONIA,  in the last 168 hours CBC:  Recent Labs Lab 04/21/12 0852 04/21/12 0909 04/21/12 1315  WBC 6.0  --  6.6  NEUTROABS 4.3  --   --   HGB 12.2 13.9 12.2  HCT 38.6 41.0 37.0  MCV 82.1  --  81.7  PLT 260  --  261   Cardiac Enzymes:  Recent Labs Lab 04/21/12 0852  TROPONINI <0.30   BNP: No components found with this basename: POCBNP,  CBG:  Recent Labs Lab 04/21/12 0858  GLUCAP 104*     Micro  Results: No results found for this or any previous visit (from the past 240 hour(s)).  Studies/Results: Dg Chest 2 View  04/21/2012  *RADIOLOGY REPORT*  Clinical Data: Stroke  CHEST - 2 VIEW  Comparison: None.  Findings: The cardiac silhouette is enlarged.  There is kyphosis and scoliosis of the spine.  There are low lung volumes.  No effusion, infiltrate, or pneumothorax.  There is underlying chronic interstitial lung disease.  IMPRESSION:  1.  No clear acute findings.  2.  Chronic interstitial lung disease. 3.  Cardiomegaly.   Original Report Authenticated By: Genevive Bi, M.D.    Ct Head Wo  Contrast  04/21/2012  *RADIOLOGY REPORT*  Clinical Data: Left arm weakness and numbness with pain in the loss of dexterity, symptoms began 04/12/2012, today unable to lift left arm; history hypertension  CT HEAD WITHOUT CONTRAST  Technique:  Contiguous axial images were obtained from the base of the skull through the vertex without contrast.  Comparison: None  Findings: Generalized atrophy. Normal ventricular morphology. No midline shift or mass effect. Tiny sulcal calcifications right parietal, nonspecific. Question small old infarct versus volume averaging artifact at right parietal lobe. Otherwise normal appearance of brain parenchyma. No intracranial hemorrhage, mass lesion or evidence of acute infarction. No extra-axial fluid collections. Visualized paranasal sinuses and mastoid air cells clear. Extensive atherosclerotic calcifications of internal carotid arteries at skull base. No acute osseous findings.  IMPRESSION: Generalized atrophy. Question small old infarct versus volume averaging artifact at right parietal lobe. No acute intracranial abnormalities.   Original Report Authenticated By: Ulyses Southward, M.D.    Mr Brain Wo Contrast  04/21/2012  *RADIOLOGY REPORT*  Clinical Data:  Left arm weakness.  Stroke risk factors include previous history of stroke and hypertension.  MRI HEAD WITHOUT CONTRAST MRA HEAD WITHOUT CONTRAST  Technique:  Multiplanar, multiecho pulse sequences of the brain and surrounding structures were obtained without intravenous contrast. Angiographic images of the head were obtained using MRA technique without contrast.  Comparison:  CT head earlier in the day.  MRI HEAD  Findings:  Multifocal areas of acute infarction affect the right frontal cortex, right posterior frontal cortex, right parietal cortex, and adjacent subcortical white matter.  There is no visible associated hemorrhage.  Moderate age related atrophy is noted.  Chronic microvascular ischemic change affects the  periventricular greater than subcortical white matter.  No significant chronic large vessel infarct.  Gyriform T1 shortening in the right occipital lobe could represent a prior post traumatic subpial hemorrhage, or prior ischemic insult with reperfusion.  No significant encephalomalacia.  No midline shift.  Dolichoectatic cerebral vasculature. Bulbous ACA abnormality, described below.  Pituitary and cerebellar tonsils unremarkable.  Mild cervical spondylosis.  No worrisome osseous lesions.  Clear sinuses and mastoids.  IMPRESSION: Multifocal areas of acute cerebral infarction as described.  These lie within the right MCA territory, and given their multiplicity and locations, a shower of emboli is not excluded.  Gyriform foci of T1 shortening affects the right occipital lobe without associated cerebral volume loss.  This could represent previous subpial hemorrhage or prior ischemic episode with reperfusion.  There is no frank lobar hemorrhage.  No significant parenchymal volume loss is noted.  MRA HEAD  Findings: The internal carotid arteries are widely patent.  The basilar artery is widely patent left vertebral dominant.  Right vertebral is patent.  No proximal MCA stenosis is seen.  Both anterior cerebral arteries are patent.  There is mild nonstenotic irregularity both posterior cerebrals.  No cerebellar branch  occlusion is present.  There is a 4 x 7 mm anterior communicating artery aneurysm at the junction of the left A1 and ACA.  This appears unruptured.  The aneurysm is wide necked.  IMPRESSION: No flow limiting intracranial stenosis.  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  This appears unruptured.   Original Report Authenticated By: Davonna Belling, M.D.    Mr Mra Head/brain Wo Cm  04/21/2012  *RADIOLOGY REPORT*  Clinical Data:  Left arm weakness.  Stroke risk factors include previous history of stroke and hypertension.  MRI HEAD WITHOUT CONTRAST MRA HEAD WITHOUT CONTRAST  Technique:  Multiplanar,  multiecho pulse sequences of the brain and surrounding structures were obtained without intravenous contrast. Angiographic images of the head were obtained using MRA technique without contrast.  Comparison:  CT head earlier in the day.  MRI HEAD  Findings:  Multifocal areas of acute infarction affect the right frontal cortex, right posterior frontal cortex, right parietal cortex, and adjacent subcortical white matter.  There is no visible associated hemorrhage.  Moderate age related atrophy is noted.  Chronic microvascular ischemic change affects the periventricular greater than subcortical white matter.  No significant chronic large vessel infarct.  Gyriform T1 shortening in the right occipital lobe could represent a prior post traumatic subpial hemorrhage, or prior ischemic insult with reperfusion.  No significant encephalomalacia.  No midline shift.  Dolichoectatic cerebral vasculature. Bulbous ACA abnormality, described below.  Pituitary and cerebellar tonsils unremarkable.  Mild cervical spondylosis.  No worrisome osseous lesions.  Clear sinuses and mastoids.  IMPRESSION: Multifocal areas of acute cerebral infarction as described.  These lie within the right MCA territory, and given their multiplicity and locations, a shower of emboli is not excluded.  Gyriform foci of T1 shortening affects the right occipital lobe without associated cerebral volume loss.  This could represent previous subpial hemorrhage or prior ischemic episode with reperfusion.  There is no frank lobar hemorrhage.  No significant parenchymal volume loss is noted.  MRA HEAD  Findings: The internal carotid arteries are widely patent.  The basilar artery is widely patent left vertebral dominant.  Right vertebral is patent.  No proximal MCA stenosis is seen.  Both anterior cerebral arteries are patent.  There is mild nonstenotic irregularity both posterior cerebrals.  No cerebellar branch occlusion is present.  There is a 4 x 7 mm anterior  communicating artery aneurysm at the junction of the left A1 and ACA.  This appears unruptured.  The aneurysm is wide necked.  IMPRESSION: No flow limiting intracranial stenosis.  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  This appears unruptured.   Original Report Authenticated By: Davonna Belling, M.D.     Medications: Scheduled Meds: . clopidogrel  75 mg Oral Q breakfast  . dorzolamide-timolol  1 drop Both Eyes BID  . heparin  5,000 Units Subcutaneous Q8H  . irbesartan  300 mg Oral Daily   And  . hydrochlorothiazide  12.5 mg Oral Daily  . latanoprost  1 drop Both Eyes QHS  . multivitamin with minerals  1 tablet Oral Daily  . simvastatin  20 mg Oral q1800      LOS: 4 days   RAI,RIPUDEEP M.D. [facility] 04/25/2012, 1:39 PM Pager: [phone]  If 7PM-7AM, please contact night-coverage [email or web address] Password [email or web address]

## 2012-04-25 NOTE — Progress Notes (Signed)
Stroke Team Progress Note  HISTORY [name] is a pleasant 79 y.o. right-handed female who lives alone in [place]. There are multiple family members who live close by and check on her frequently. She quit driving several years ago but otherwise has been very independent and continues to cook, clean house, and perform ADLs independently. She uses a rolling walker in her home for ambulation.  Approximately 7-10 days ago the patient noted some mild left upper extremity weakness. This gradually progressed and yesterday 04/21/2012 she went to turn on the bathroom light switch with her left hand and her arm collapsed into the sink. She [facility]21[facility]12 and a CT of the head showed generalized atrophy with a possible old small infarct in the right parietal lobe but no acute intracranial abnormalities. An MRI confirmed multifocal areas of acute cerebral infarction within the right middle cerebral artery territory. She was admitted for further evaluation and treatment. A neurology consult was requested.  The patient denied any weakness of the left leg. She denied any difficulties with speech or swallowing. She denied loss of sensation. Patient was not a TPA candidate secondary to late presentation. She was admitted for further evaluation and treatment.  SUBJECTIVE Pt lying in bed, talking on the phone. Denies any palpititions this am but telemetry showed transient atrial flutter at 545 am.  OBJECTIVE Most recent Vital Signs: Filed Vitals:   04/24/12 1821 04/24/12 2302 04/25/12 0124 04/25/12 0524  BP: 102/47 125/58 123/64 126/68  Pulse: 87 85 85 86  Temp: 97.9 F (36.6 C) 98.7 F (37.1 C) 97.9 F (36.6 C) 97 F (36.1 C)  TempSrc:   Oral Oral  Resp: 20 20 20 18   Height:      Weight:      SpO2: 98% 99% 96% 99%   CBG (last 3)  No results found for this basename: GLUCAP,  in the last 72 hours IV Fluid Intake:     MEDICATIONS  . clopidogrel  75 mg Oral Q  breakfast  . dorzolamide-timolol  1 drop Both Eyes BID  . heparin  5,000 Units Subcutaneous Q8H  . irbesartan  300 mg Oral Daily   And  . hydrochlorothiazide  12.5 mg Oral Daily  . latanoprost  1 drop Both Eyes QHS  . multivitamin with minerals  1 tablet Oral Daily  . simvastatin  20 mg Oral q1800   PRN:  alum & mag hydroxide-simeth, hydrALAZINE, ondansetron, senna-docusate  Diet:  Cardiac thin liquids Activity:   Up with assistance DVT Prophylaxis:  Subcutaneous heparin.  CLINICALLY SIGNIFICANT STUDIES Basic Metabolic Panel:   Recent Labs Lab 04/21/12 0852 04/21/12 0909 04/21/12 1315  NA 137 140  --   K 3.7 3.8  --   CL 99 100  --   CO2 26  --   --   GLUCOSE 104* 105*  --   BUN 21 22  --   CREATININE 1.04 1.10 1.03  CALCIUM 9.7  --   --    Liver Function Tests:   Recent Labs Lab 04/21/12 0852  AST 21  ALT 11  ALKPHOS 61  BILITOT 0.3  PROT 7.2  ALBUMIN 3.9   CBC:   Recent Labs Lab 04/21/12 0852 04/21/12 0909 04/21/12 1315  WBC 6.0  --  6.6  NEUTROABS 4.3  --   --   HGB 12.2 13.9 12.2  HCT 38.6 41.0 37.0  MCV 82.1  --  81.7  PLT 260  --  261   Coagulation:   Recent Labs Lab 04/21/12 0852  LABPROT 12.7  INR 0.96   Cardiac Enzymes:   Recent Labs Lab 04/21/12 0852  TROPONINI <0.30   Urinalysis:   Recent Labs Lab 04/21/12 1055  COLORURINE YELLOW  LABSPEC 1.013  PHURINE 7.0  GLUCOSEU NEGATIVE  HGBUR NEGATIVE  BILIRUBINUR NEGATIVE  KETONESUR NEGATIVE  PROTEINUR NEGATIVE  UROBILINOGEN 0.2  NITRITE NEGATIVE  LEUKOCYTESUR NEGATIVE   Lipid Panel    Component Value Date/Time   CHOL 159 04/22/2012 0540   TRIG 111 04/22/2012 0540   HDL 46 04/22/2012 0540   CHOLHDL 3.5 04/22/2012 0540   VLDL 22 04/22/2012 0540   LDLCALC 91 04/22/2012 0540   HgbA1C  Lab Results  Component Value Date   HGBA1C 5.8* 04/22/2012    Urine Drug Screen:   No results found for this basename: labopia,  cocainscrnur,  labbenz,  amphetmu,  thcu,  labbarb    Alcohol  Level: No results found for this basename: ETH,  in the last 168 hours  CT Head 04/21/12 Generalized atrophy. Question small old infarct versus volume averaging artifact at right parietal lobe. No acute intracranial abnormalities.     MRA Head/brain 04/21/12 Multifocal areas of acute cerebral infarction as described.  These lie within the right MCA territory, and given their multiplicity and locations, a shower of emboli is not excluded.  Gyriform foci of T1 shortening affects the right occipital lobe without associated cerebral volume loss.  This could represent previous subpial hemorrhage or prior ischemic episode with reperfusion.  There is no frank lobar hemorrhage.  No significant parenchymal volume loss is noted.    MRA HEAD 04/21/12 No flow limiting intracranial stenosis.  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  This appears unruptured.     2D Echocardiogram  ejection fraction 65-70%. No cardiac source of emboli identified.  Carotid Dopplers - Right: 40-59% ICA stenosis. ICA/CCA ratio is 1.99. Left: No evidence of significant ICA stenosis. ICA/CCA ratio is 1.46. Bilateral: Vertebral artery flow is antegrade.    TEE  No SOE, no PFO, no vegetations, loop recorder recommended  Chest XRAY 04/21/12 1.  No clear acute findings.  2.  Chronic interstitial lung disease. 3.  Cardiomegaly.      EKG: Sinus rhythm rate 84 beats per minutes.  Therapy Recommendations Home health PT;Supervision/Assistance - 24 hour -> changed to CIR  Physical Exam  General - alert 79 year old female seated in a chair in no acute distress.  Heart - Regular rate and rhythm - no murmer  Lungs - Clear to auscultation  Abdomen - Soft - non tender  Extremities - Distal pulses weak but intact - no edema  Skin - Warm and dry   Neurologic Examination  Mental Status:  Alert, oriented, thought content appropriate. Speech fluent without evidence of aphasia. Able to follow 3 step commands without difficulty.  Cranial  Nerves:  II: Discs not visualized; Visual fields grossly normal, pupils equal, round, reactive to light and accommodation  III,IV, VI: ptosis not present, extra-ocular motions intact bilaterally  V,VII: smile symmetric, facial light touch sensation normal bilaterally  VIII: hearing normal bilaterally  IX,X: gag reflex present  XI: bilateral shoulder shrug  XII: midline tongue extension  Motor:  Right : Upper extremity 5/5 Left: Upper extremity 3-4/5 proximally 1-2/5 distally  Lower extremity 5/5 Lower extremity 5/5  Tone and bulk:normal tone throughout; no atrophy noted  Sensory: Slightly decreased sensation to light touch in the left upper extremity.  Deep Tendon Reflexes: 2+ and symmetric throughout  Cerebellar:  normal finger-to-nose with the right upper extremity. Unable to perform this testing with the left upper extremity secondary to hemiparesis. Essentially normal heel-to-shin testing in both lower extremities.  Gait: Not tested  CV: pulses palpable throughout   ASSESSMENT Sandra Larson is a 79 y.o. right / left handed female presenting with left upper extremity monoparesis secondary to embolic right MCA infarct. TEE negative. New atrial flutter per note 4/8 at 535a. Unable to locate strip to confirm at time of rounds. On no antiplatelets her home med rec prior to admission. Now on clopidogrel 75 mg orally every day for secondary stroke prevention. Patient with resultant LUE hemiparesis. Work up completed.   ? New atrial flutter transiently on telemetry this am Hypertension LDL 91 HgbA1c 5.8  Hospital day # 4  TREATMENT/PLAN  Continue clopidogrel 75 mg orally every day for secondary stroke prevention. Confirm atrial flutter, if present, recommend NOAC for treatment. If no atrial fib confirmed in hospital, please schedule outpatient telemetry monitoring to assess patient for atrial fibrillation as source of stroke. May be arranged with patient's cardiologist, or  cardiologist of choice.   CIR consult  D/w Dr Berneda Rose, MSN, RN, ANVP-BC, ANP-BC, GNP-BC [facility] Pager: [phone] 04/25/2012 9:31 AM  I have personally obtained a history, examined the patient, evaluated imaging results, and formulated the assessment and plan of care. I agree with the above. Delia Heady, MD

## 2012-04-25 NOTE — Plan of Care (Signed)
Overall Plan of Care [facility]) Patient Details Name: Sandra Larson MRN: [id number] DOB: 06/18/1913  Diagnosis:  Left hemiparesis due to R MCA infarct  Co-morbidities: Hypertension  .  Glaucoma    Functional Problem List  Patient demonstrates impairments in the following areas: Balance, Endurance, Medication Management, Motor, Pain, Safety and Skin Integrity  Basic ADL's: grooming, bathing, dressing and toileting Advanced ADL's: simple meal preparation, laundry and light housekeeping  Transfers:  bed mobility, bed to chair, toilet and car Locomotion:  ambulation and stairs  Additional Impairments:  Functional use of upper extremity  Anticipated Outcomes Item Anticipated Outcome  Eating/Swallowing    Basic self-care  Mod I  Tolieting  Mod I  Bowel/Bladder  Mod independent  Transfers  Mod I  Locomotion  Mod I with RW  Communication    Cognition    Pain  No c/o pain  Safety/Judgment    Other  Skin: no new breakdown while on rehab   Therapy Plan: PT Intensity: Minimum of 1-2 x/day ,45 to 90 minutes PT Frequency: 5 out of 7 days PT Duration Estimated Length of Stay: 10 days OT Intensity: Minimum of 1-2 x/day, 45 to 90 minutes OT Frequency: 5 out of 7 days OT Duration/Estimated Length of Stay: 7-10 days      Team Interventions: Item RN PT OT SLP SW TR Other  Self Care/Advanced ADL Retraining   x      Neuromuscular Re-Education  x x      Therapeutic Activities  x x      UE/LE Strength Training/ROM  x x      UE/LE Coordination Activities  x x      Visual/Perceptual Remediation/Compensation         DME/Adaptive Equipment Instruction  x x      Therapeutic Exercise  x x      Balance/Vestibular Training  x x      Patient/Family Education x x x      Cognitive Remediation/Compensation         Functional Mobility Training  x x      Ambulation/Gait Training  x       Media planner         Bladder Management X        Bowel Management x        Disease Management/Prevention x x x      Pain Management x x x      Medication Management x        Skin Care/Wound Management x        Splinting/Orthotics  x x      Discharge Planning  x x      Psychosocial Support   x                             Team Discharge Planning: Destination: PT-Home ,OT- Home , SLP-  Projected Follow-up: PT-Outpatient PT, OT-  Outpatient OT, SLP-  Projected Equipment Needs: PT-None recommended by PT, OT- 3 in 1 bedside comode (TBD), SLP-  Patient/family involved in discharge planning: PT- Patient;Patient unable/family or caregiver not available,  OT-Patient, SLP-   MD ELOS:  2 weeks Medical Rehab Prognosis:  Good Assessment: 79 yo female living independantly admitted with acute R MCA distribution infarct now requiring 24/7 rehab RN, MD CIR level PT/OT/SLP.  Treatment team to focus on NM re education ,ADL, Cog/Percept, Safety, mobility, with goals of Sup level. Mobility and ADL    See Team Conference Notes for weekly updates to the plan of care

## 2012-04-25 NOTE — H&P (View-Only) (Signed)
Physical Medicine and Rehabilitation Admission H&P    Chief Complaint  Patient presents with  . Weakness  : HPI: Sandra Larson is a 79 y.o. right hand female with history of hypertension who lives alone in [place]. She uses a rolling walker in her home for ambulation. Admitted 04/21/2012 with progressive left-sided weakness over a seven-day period. MRI of the brain showed multifocal areas of acute cerebral infarct. These lie within the right MCA territory. 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 Patient did not receive TPA. Echocardiogram with ejection fraction of 65-70% without emboli. Carotid Dopplers with right 40-59% ICA stenosis. TEE showed no thrombus or vegetation.. Neurology services consulted maintained on Plavix therapy for stroke prophylaxis as well as subcutaneous heparin for DVT prophylaxis. Question atrial flutter 04/25/2012 at 5:35 AM EKG strip was not available. Contact  to neurology services as well as consult to cardiology Dr. Ganji and advised to change Plavix to her Pradaxa. Her subcutaneous heparin was discontinued once Pradaxa was initiated. Patient remained asymptomatic. Physical and occupational therapy evaluation completed 04/22/2012 an ongoing. M.D. is requested physical medicine rehabilitation consult to consider inpatient rehabilitation services. Patient was felt to be a good candidate for inpatient rehabilitation services and was admitted for comprehensive rehabilitation program  Review of Systems  Gastrointestinal: Positive for constipation.  Reflux  Musculoskeletal: Positive for myalgias and joint pain.  All other systems reviewed and are negative   Past Medical History  Diagnosis Date  . Hypertension   . Glaucoma    Past Surgical History  Procedure Laterality Date  . Appendectomy    . Cataract extraction    . Esophagogastroduodenoscopy (egd) with esophageal dilation    . Tee without  cardioversion N/A 04/24/2012    Procedure: TRANSESOPHAGEAL ECHOCARDIOGRAM (TEE);  Surgeon: Mihai Croitoru, MD;  Location: [facility];  Service: Cardiovascular;  Laterality: N/A;   Family History  Problem Relation Age of Onset  . Stroke Mother   . Stroke Father    Social History:  reports that she has never smoked. She does not have any smokeless tobacco history on file. She reports that she does not drink alcohol or use illicit drugs. Allergies:  Allergies  Allergen Reactions  . Penicillins     chilhood  . Sulfa Antibiotics Rash   Medications Prior to Admission  Medication Sig Dispense Refill  . dorzolamide-timolol (COSOPT) 22.3-6.8 MG/ML ophthalmic solution Place 1 drop into both eyes 2 (two) times daily.      . latanoprost (XALATAN) 0.005 % ophthalmic solution Place 1 drop into both eyes at bedtime.      . Multiple Vitamin (MULTIVITAMIN WITH MINERALS) TABS Take 1 tablet by mouth daily.      . valsartan-hydrochlorothiazide (DIOVAN-HCT) 320-12.5 MG per tablet Take 1 tablet by mouth daily.        Home: Home Living Lives With: Alone Available Help at Discharge: Family;Available PRN/intermittently Type of Home: House Home Access: Stairs to enter Entrance Stairs-Number of Steps: 3 Entrance Stairs-Rails: Left;Right Home Layout: One level Bathroom Shower/Tub: Walk-in shower;Door (sponge bath) Bathroom Toilet: Standard Home Adaptive Equipment: Walker - rolling;Straight cane (started using RW few weeks ago, cane previous)   Functional History: Prior Function Able to Take Stairs?: Yes Driving: No Vocation: Retired  Functional Status:  Mobility: Bed Mobility Bed Mobility: Not assessed Rolling Left: 5: Supervision;With rail Left Sidelying to Sit: 5: Supervision;HOB flat Supine to Sit: 4: Min assist Sitting - Scoot to Edge of Bed:   4: Min guard Transfers Transfers: Sit to Stand;Stand to Sit Sit to Stand: 4: Min assist;From chair/3-in-1;With armrests;With upper extremity  assist;From toilet Stand to Sit: 4: Min assist;To chair/3-in-1;With armrests;With upper extremity assist;To toilet Ambulation/Gait Ambulation/Gait Assistance: 4: Min assist Ambulation Distance (Feet): 160 Feet (seated rest after 80 ft) Assistive device: Rolling walker Ambulation/Gait Assistance Details: initial cuing for keeping RW closer to her and for incr UE extension/support when Rt knee buckles; RW height adjusted prior to 2nd walk and better able to utilize UEs safely; pt with Rt knee buckling x 6-8 times due to pain/weakness (from prior injury per pt) Gait Pattern: Step-to pattern;Decreased stride length;Right flexed knee in stance;Antalgic;Trunk flexed Gait velocity: decreased General Gait Details: steady with assist to support hand on rw (R), Max VCs to attend to R hand placement and positioning; VCs for body positioning within rw Stairs: No    ADL: ADL Grooming: Performed;Wash/dry hands;Min guard Where Assessed - Grooming: Supported standing Upper Body Bathing: Moderate assistance Upper Body Dressing: Performed;Moderate assistance Where Assessed - Upper Body Dressing: Unsupported sitting Toilet Transfer: Performed;Minimal assistance Toilet Transfer Method: Sit to stand Toilet Transfer Equipment: Regular height toilet Equipment Used: Rolling walker Transfers/Ambulation Related to ADLs: verbal cues to keep L hand on RW ADL Comments: Pt not wearing L hand splint at start of tx, OT donned splint and pt states that she felt that staff had put it on too tightly before and that it made her thumb swell so the Dr removed it yesterday. OT able to get 2 fingers under splint after donning so not too tight, pt stated that it felt better. Pt wanted splint off at end of session. OT positioned pt's L UE in elevation while pt seated in recliner  Cognition: Cognition Overall Cognitive Status: Appears within functional limits for tasks assessed Arousal/Alertness: Awake/alert Orientation Level:  Oriented X4 Memory: Appears intact Awareness: Appears intact Problem Solving: Appears intact Safety/Judgment: Appears intact Cognition Overall Cognitive Status: Appears within functional limits for tasks assessed/performed Arousal/Alertness: Awake/alert Orientation Level: Appears intact for tasks assessed Behavior During Session: WFL for tasks performed Cognition - Other Comments: decr awareness to new deficits  Physical Exam: Blood pressure 126/68, pulse 86, temperature 97 F (36.1 C), temperature source Oral, resp. rate 18, height 5' 5" (1.651 m), weight 54.432 kg (120 lb), SpO2 99.00%. Physical Exam  Vitals reviewed.  Constitutional: She is oriented to person, place, and time.  HENT: oral mucosa generally pink and moist.  Head: Normocephalic.  Eyes: EOM are normal.  Neck: Normal range of motion. Neck supple. No thyromegaly present.  Cardiovascular: Normal rate and regular rhythm. No murmurs, or gallops Pulmonary/Chest: Effort normal and breath sounds normal. No respiratory distress. No wheezes or rales. Abdominal: Soft. Bowel sounds are normal. She exhibits distension. There is no tenderness.  Musculoskeletal: She exhibits no edema. No pain with ROM Neurological: She is alert and oriented to person, place, and time. She displays normal reflexes. She exhibits normal muscle tone. Coordination abnormal.  Follows full commands. LUE is 2+ deltoid, 3 to 3+ elbow, wrist. , 3+ to 4- HI. LLE grossly 3+ to 4/5. No gross sensory deficits. Good insight and awareness. No apparent visual spatial deficits. Wearing splint over left wrist. Skin: Skin is warm and dry.  Psychiatric: She has a normal mood and affect. Her behavior is normal. Judgment and thought content normal   No results found for this or any previous visit (from the past 48 hour(s)). No results found.  Post Admission Physician Evaluation: 1. Functional   deficits secondary  to embolic right MCA infarct. 2. Patient is admitted to  receive collaborative, interdisciplinary care between the physiatrist, rehab nursing staff, and therapy team. 3. Patient's level of medical complexity and substantial therapy needs in context of that medical necessity cannot be provided at a lesser intensity of care such as a SNF. 4. Patient has experienced substantial functional loss from his/her baseline which was documented above under the "Functional History" and "Functional Status" headings.  Judging by the patient's diagnosis, physical exam, and functional history, the patient has potential for functional progress which will result in measurable gains while on inpatient rehab.  These gains will be of substantial and practical use upon discharge  in facilitating mobility and self-care at the household level. 5. Physiatrist will provide 24 hour management of medical needs as well as oversight of the therapy plan/treatment and provide guidance as appropriate regarding the interaction of the two. 6. 24 hour rehab nursing will assist with bladder management, bowel management, safety, skin/wound care, disease management, medication administration and patient education  and help integrate therapy concepts, techniques,education, etc. 7. PT will assess and treat for/with: Lower extremity strength, range of motion, stamina, balance, functional mobility, safety, adaptive techniques and equipment, NMR, education.   Goals are: mod I. 8. OT will assess and treat for/with: ADL's, functional mobility, safety, upper extremity strength, adaptive techniques and equipment, NMR, education.   Goals are: mod I to supervision. 9. SLP will assess and treat for/with: n/a.  Goals are: n/a. 10. Case Management and Social Worker will assess and treat for psychological issues and discharge planning. 11. Team conference will be held weekly to assess progress toward goals and to determine barriers to discharge. 12. Patient will receive at least 3 hours of therapy per day at least 5  days per week. 13. ELOS: 7-10 days      Prognosis:  excellent   Medical Problem List and Plan: 1. Suspected embolic Right MCA infarct 2. DVT Prophylaxis/Anticoagulation: Subcutaneous heparin discontinued today after Pradaxa initiated. Monitor for any signs of bleeding. Minimize fall risks 3. Neuropsych: This patient is capable of making decisions on his/her own behalf. 4. Hypertension/atrial flutter. Avapro 300 mg daily, hydrochlorothiazide 12.5 mg daily. Closely monitor bp/hr with increased mobility.Pradaxa initiated for atrial flutter as well as stroke prophylaxis.   -adjust regimen as needed 5. Hyperlipidemia. Zocor 6. Glaucoma. Continue eye drops as advised    Zachary T. Swartz, MD, FAAPMR  04/25/2012 

## 2012-04-25 NOTE — Progress Notes (Addendum)
Cardiology has confirmed EKG strip is atrial fibrillation. Given dx of atrial fibrillation in setting of right MCA stroke, recommend NOAC for treatment. Prior to admission pt independent with ADLs, walks with a walker. Now with left UE/hand weakness.  Per PT, has already shown improvement in strength in Lt wrist/hand and ability to hold onto RW. Plans for CIR to maximize independence prior to discharge home.   AAN Guildelines on Stroke Prevention in AF (Neurol. 2014;82:716-724), Level B  "Clinical should routinely offer anticoagulation to patientts with nonvalvular atrial fibrillation and a history of TIA or stroke."  "For patient at higher risk for intracranial bleeding, clinicians should administer dabigatran, rivaroxaban, or apixaban to pts who require anticoagulant, as they have a lower risk for intracranial bleeding compare with warfarin"  "Clinicians should routinely offer oral anticoagulate to elderly patients (aged > 75 years) with nonvalvular atrial fibrillation if there is no history of recent unprovoked bleeding or intracranial hemorrhage."  Annie Main, MSN, RN, ANVP-BC, ANP-BC, GNP-BC [facility] Pager: [phone] 04/25/2012 1:41 PM  I have personally obtained a history, examined the patient, evaluated imaging results, and formulated the assessment and plan of care. I agree with the above. I spoke to the patient personally at length and explained the risk benefit of stroke prevention with now anticoagulants as well as increased risk of bleeding given the patient's age and based on the recent AAN guidelines would recommend anticoagulation with  Pradaxa. The patient understands the risks and she is feeling to take Pradaxa  Discussed with Dr. Isidoro Donning and daughter Georgiana Shore who are in agreement as well. Delia Heady, MD

## 2012-04-26 ENCOUNTER — Inpatient Hospital Stay (HOSPITAL_COMMUNITY): Payer: Medicare Other | Admitting: Physical Therapy

## 2012-04-26 ENCOUNTER — Inpatient Hospital Stay (HOSPITAL_COMMUNITY): Payer: Medicare Other | Admitting: Occupational Therapy

## 2012-04-26 DIAGNOSIS — I749 Embolism and thrombosis of unspecified artery: Secondary | ICD-10-CM

## 2012-04-26 DIAGNOSIS — I4891 Unspecified atrial fibrillation: Secondary | ICD-10-CM

## 2012-04-26 DIAGNOSIS — I6340 Cerebral infarction due to embolism of unspecified cerebral artery: Secondary | ICD-10-CM

## 2012-04-26 LAB — COMPREHENSIVE METABOLIC PANEL
ALT: 22 U/L (ref 0–35)
Albumin: 3.1 g/dL — ABNORMAL LOW (ref 3.5–5.2)
BUN: 30 mg/dL — ABNORMAL HIGH (ref 6–23)
Calcium: 9.3 mg/dL (ref 8.4–10.5)
Chloride: 97 mEq/L (ref 96–112)
Creatinine, Ser: 1.01 mg/dL (ref 0.50–1.10)
Total Bilirubin: 0.5 mg/dL (ref 0.3–1.2)
Total Protein: 5.8 g/dL — ABNORMAL LOW (ref 6.0–8.3)

## 2012-04-26 LAB — CBC WITH DIFFERENTIAL/PLATELET
Basophils Absolute: 0 10*3/uL (ref 0.0–0.1)
Eosinophils Absolute: 0.3 10*3/uL (ref 0.0–0.7)
HCT: 33.7 % — ABNORMAL LOW (ref 36.0–46.0)
Hemoglobin: 11.4 g/dL — ABNORMAL LOW (ref 12.0–15.0)
Lymphs Abs: 1.6 10*3/uL (ref 0.7–4.0)
MCH: 27.1 pg (ref 26.0–34.0)
MCHC: 33.8 g/dL (ref 30.0–36.0)
MCV: 80 fL (ref 78.0–100.0)
Monocytes Absolute: 0.7 10*3/uL (ref 0.1–1.0)
Monocytes Relative: 8 % (ref 3–12)
Neutro Abs: 5.7 10*3/uL (ref 1.7–7.7)
RDW: 14.1 % (ref 11.5–15.5)
WBC: 8.3 10*3/uL (ref 4.0–10.5)

## 2012-04-26 MED ORDER — DICLOFENAC SODIUM 1 % TD GEL
2.0000 g | Freq: Four times a day (QID) | TRANSDERMAL | Status: DC
  Administered 2012-04-26 – 2012-05-05 (×35): 2 g via TOPICAL
  Filled 2012-04-26 (×2): qty 100

## 2012-04-26 NOTE — Patient Care Conference (Signed)
Inpatient RehabilitationTeam Conference and Plan of Care Update Date: 04/26/2012   Time: 11;05 AM    Patient Name: Sandra Larson      Medical Record Number: [id number]  Date of Birth: Mar 07, 1913 Sex: Female         Room/Bed: 4030/4030-01 Payor Info: Payor: MEDICARE  Plan: MEDICARE PART A AND B  Product Type: *No Product type*     Admitting Diagnosis: RT MCA CVA  Admit Date/Time:  04/25/2012  3:59 PM Admission Comments: No comment available   Primary Diagnosis:  Embolic infarction Principal Problem: Embolic infarction  Patient Active Problem List   Diagnosis Date Noted  . Embolic infarction 04/26/2012  . Atrial fibrillation 04/25/2012  . CVA (cerebral infarction) 04/21/2012  . HTN (hypertension) 04/21/2012    Expected Discharge Date: Expected Discharge Date: 05/05/12  Team Members Present: Physician leading conference: Dr. Claudette Laws Social Worker Present: Dossie Der, LCSW Nurse Present: Other (comment) Charisse March Hicks-RN) PT Present: Edman Circle, PT;Caroline Adriana Simas, PT;Other (comment) Clarisse Gouge Ripa-PT) OT Present: Leonette Monarch, Felipa Eth, OT SLP Present: Fae Pippin, SLP Other (Discipline and Name): Charolette Child Coordinator     Current Status/Progress Goal Weekly Team Focus  Medical   Right knee pain, nausea  Improve pain in R knee  trial voltaren   Bowel/Bladder   continent of bowel/bladder, lbm 4/7  mod independent  toilet patient q3hours    Swallow/Nutrition/ Hydration     na        ADL's     new eval        Mobility   mod A overall  mod I overall except supervision stairs  pain in R knee, balance, gait   Communication     na        Safety/Cognition/ Behavioral Observations    na        Pain   no c/o pain  no c/o pain  no c/o pain   Skin   skin C.D.I.  no new breakdown while on rehab   monitor skin and use protective skin care products       *See Care Plan and progress notes for long and short-term goals.  Barriers to  Discharge: slow progress due to age anticipated    Possible Resolutions to Barriers:  adjust D/C date to maximize success    Discharge Planning/Teaching Needs:  Home depends if eneds assistance-granddaguther can stay with if needed.  Daughter checks on daily      Team Discussion:  R-Knee pain-pta treating with voltaren gel.  New eval Balance issues but doing well.  Revisions to Treatment Plan:  New eval   Continued Need for Acute Rehabilitation Level of Care: The patient requires daily medical management by a physician with specialized training in physical medicine and rehabilitation for the following conditions: Daily direction of a multidisciplinary physical rehabilitation program to ensure safe treatment while eliciting the highest outcome that is of practical value to the patient.: Yes Daily medical management of patient stability for increased activity during participation in an intensive rehabilitation regime.: Yes Daily analysis of laboratory values and/or radiology reports with any subsequent need for medication adjustment of medical intervention for : Neurological problems  Chance Karam, Lemar Livings 04/26/2012, 1:48 PM

## 2012-04-26 NOTE — Progress Notes (Signed)
Social Work Patient ID: Sandra Larson, female   DOB: [birth year], 78 y.o.   MRN: [id number] Met with pt, daughter and granddaughter to inform of team conference goals-supervision/mod/i level and discharge 4/18. Discussed balance issues and working on left arm.  Family aware would be good to have someone with her when first goes home. Work on discharge plans and see how pt does.

## 2012-04-26 NOTE — Progress Notes (Signed)
Patient information reviewed and entered into eRehab system by Ludell Zacarias, RN, CRRN, PPS Coordinator.  Information including medical coding and functional independence measure will be reviewed and updated through discharge.     Per nursing patient was given "Data Collection Information Summary for Patients in Inpatient Rehabilitation Facilities with attached "Privacy Act Statement-Health Care Records" upon admission.  

## 2012-04-26 NOTE — Evaluation (Signed)
Physical Therapy Assessment and Plan  Patient Details  Name: OLUWATONI ROTUNNO MRN: [id number] Date of Birth: 10-26-1913  PT Diagnosis: Abnormality of gait, Difficulty walking, Hemiplegia non-dominant, Muscle weakness, Osteoarthritis, Pain in joint and Pain in R knee, pain in L shoulder Rehab Potential: Excellent ELOS: 10 days   Today's Date: 04/26/2012 Time: 0900-0958 Time Calculation (min): 58 min  Problem List:  Patient Active Problem List  Diagnosis  . CVA (cerebral infarction)  . HTN (hypertension)  . Atrial fibrillation  . Embolic infarction    Past Medical History:  Past Medical History  Diagnosis Date  . Hypertension   . Glaucoma    Past Surgical History:  Past Surgical History  Procedure Laterality Date  . Appendectomy    . Cataract extraction    . Esophagogastroduodenoscopy (egd) with esophageal dilation    . Tee without cardioversion N/A 04/24/2012    Procedure: TRANSESOPHAGEAL ECHOCARDIOGRAM (TEE);  Surgeon: Thurmon Fair, MD;  Location: [facility] ENDOSCOPY;  Service: Cardiovascular;  Laterality: N/A;    Assessment & Plan Clinical Impression: Patient is a 79 y.o. right hand female with history of hypertension who lives alone in [place]. She uses a rolling walker in her home for ambulation. Admitted 04/21/2012 with progressive left-sided weakness over a seven-day period. MRI of the brain showed multifocal areas of acute cerebral infarct. These lie within the right MCA territory. 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 Patient did not receive TPA. Echocardiogram with ejection fraction of 65-70% without emboli. Carotid Dopplers with right 40-59% ICA stenosis. TEE showed no thrombus or vegetation. Neurology services consulted maintained on Plavix therapy for stroke prophylaxis as well as subcutaneous heparin for DVT prophylaxis. Question atrial flutter 04/25/2012 at 5:35 AM EKG strip was not available.  Contact to neurology services as well as consult to cardiology Dr. Jacinto Halim and advised to change Plavix to her Pradaxa. Her subcutaneous heparin was discontinued once Pradaxa was initiated. Patient remained asymptomatic.  Patient transferred to CIR on 04/25/2012 .   Patient currently requires mod with mobility secondary to muscle weakness and pain in R knee (OA), L shoulder pain, decreased cardiorespiratoy endurance and impaired gait, abnormal tone and impaired motor control, timing and sequencing LUE and decreased standing balance, hemiplegia and decreased balance strategies.  Prior to hospitalization, patient was modified independent  with mobility with SPC and then RW and lived with Alone in a House home.  Home access is 3Stairs to enter.  Patient's daughter and granddaughter live close by and can assist as needed.  Patient did not leave house PTA without assistance of daughter.  Patient will benefit from skilled PT intervention to maximize safe functional mobility and minimize fall risk for planned discharge home with intermittent assist.  Anticipate patient will benefit from follow up OP at discharge.  PT - End of Session Activity Tolerance: Tolerates 10 - 20 min activity with multiple rests Endurance Deficit: Yes Endurance Deficit Description: nausea/vomiting PT Assessment Rehab Potential: Excellent Barriers to Discharge: None PT Plan PT Intensity: Minimum of 1-2 x/day ,45 to 90 minutes PT Frequency: 5 out of 7 days PT Duration Estimated Length of Stay: 10 days PT Treatment/Interventions: Ambulation/gait training;Balance/vestibular training;Community reintegration;Discharge planning;Disease management/prevention;DME/adaptive equipment instruction;Functional mobility training;Neuromuscular re-education;Pain management;Patient/family education;Splinting/orthotics;Stair training;Therapeutic Activities;Therapeutic Exercise;UE/LE Strength taining/ROM;UE/LE Coordination activities PT  Recommendation Follow Up Recommendations: Outpatient PT Patient destination: Home Equipment Recommended: None recommended by PT  Skilled Therapeutic Intervention Patient educated on overall goals for D/C, estimated LOS and  patient's individual goals.  Also discussed assistance and equipment available upon D/C.  During discussion of goals patient began to c/o nausea and began to vomit saliva and phlegm; no food.  Patient returned to room and RN and MD notified of nausea, pain and vomiting.  Patient given ginger ale for nausea.  PT Evaluation Precautions/Restrictions Precautions Precautions: Fall;Shoulder;Knee Type of Shoulder Precautions: Minor subluxation L shoulder; keep supported Precaution Comments: R knee pain/OA Required Braces or Orthoses: Other Brace/Splint Other Brace/Splint: Lt wrist Restrictions Weight Bearing Restrictions: No General Chart Reviewed: Yes Response to Previous Treatment: Patient with no complaints from previous session. Family/Caregiver Present: No  Vital SignsTherapy Vitals Pulse Rate: 80 (after stair negotiation) Patient Position, if appropriate: Sitting Pain Pain Assessment Pain Assessment: 0-10 Pain Score:   5 Pain Type: Chronic pain Pain Location: Knee Pain Orientation: Right Pain Descriptors: Aching;Sore Pain Onset: With Activity Pain Intervention(s): RN made aware;Refused;Rest Multiple Pain Sites: Yes 2nd Pain Site Pain Score: 5 Pain Type: Acute pain Pain Location: Epigastric Pain Orientation: Medial Pain Descriptors: Discomfort;Other (Comment) (nausea) Pain Onset: Gradual Pain Intervention(s): RN made aware 3rd Pain Site Pain Score: 4 Pain Type: Acute pain Pain Location: Arm Pain Orientation: Left Pain Descriptors: Discomfort;Sore Pain Onset: Other (Comment) (when unsupported; minor subluxation noted) Pain Frequency: Occasional Pain Intervention(s): Repositioned Home Living/Prior Functioning Home Living Lives With:  Alone Available Help at Discharge: Family;Available PRN/intermittently Type of Home: House Home Access: Stairs to enter [employer] of Steps: 3 Entrance Stairs-Rails: Right;Left Home Layout: One level Bathroom Shower/Tub: Walk-in shower;Door;Other (comment) (pt prefers to sponge bathe) Bathroom Toilet: Standard Bathroom Accessibility: Yes How Accessible: Accessible via walker Home Adaptive Equipment: Walker - rolling;Straight cane Additional Comments: Was using SPC in RUE until 6 weeks ago; feeling more unsteady; changed to RW Prior Function Level of Independence: Independent with homemaking with ambulation;Independent with gait;Independent with transfers;Requires assistive device for independence;Independent with basic ADLs Able to Take Stairs?: Yes Driving: No Vocation: Retired Optometrist - History Baseline Vision: Wears glasses all the time Visual History: Glaucoma Patient Visual Report: No change from baseline Vision - Assessment Eye Alignment: Within Functional Limits Vision Assessment: Vision tested Ocular Range of Motion: Within Functional Limits Tracking/Visual Pursuits: Decreased smoothness of eye movement to RIGHT superior field;Decreased smoothness of eye movement to LEFT superior field Saccades: Additional eye shifts occurred during testing Convergence: Impaired (comment) Visual Fields: No apparent deficits Perception Perception: Within Functional Limits Praxis Praxis: Intact  Cognition Overall Cognitive Status: Appears within functional limits for tasks assessed Arousal/Alertness: Awake/alert Orientation Level: Oriented X4 Memory: Appears intact Awareness: Appears intact Safety/Judgment: Appears intact Sensation Sensation Light Touch: Appears Intact Stereognosis: Not tested Hot/Cold: Not tested Proprioception: Appears Intact Coordination Gross Motor Movements are Fluid and Coordinated: Yes (in LLE; not coordinated in LUE  secondary to weakness) Fine Motor Movements are Fluid and Coordinated: No Coordination and Movement Description: impaired by weakness Motor  Motor Motor: Hemiplegia;Abnormal tone  Mobility Bed Mobility Bed Mobility: Not assessed Transfers Stand Pivot Transfers: 3: Mod assist Stand Pivot Transfer Details (indicate cue type and reason): Required mod A for sit <> stand and stand pivot with UE support on RW with assistance for lifting and to bring COG fully anterior over BOS; patient feels she is limited today by R knee pain Locomotion  Ambulation Ambulation/Gait Assistance: 3: Mod assist Ambulation Distance (Feet): 50 Feet Assistive device: Rolling walker Ambulation/Gait Assistance Details: Performed gait in controlled environment x 50' with UE support on RW with R wrist splint and then without to  assist with grip on RW with mod A to maintain straight navigation with RW Gait Gait Pattern: Step-through pattern;Decreased stride length;Decreased weight shift to right;Narrow base of support;Antalgic Stairs / Additional Locomotion Stairs: Yes Stairs Assistance: 3: Mod assist Stairs Assistance Details (indicate cue type and reason): Performed up and down 5 stairs with 2 rails with mod A to fully advance COG anterior to next step and prevent RLE buckling; cued to ascend with LLE and descend with RLE secondary to knee OA pain and instability Stair Management Technique: Two rails;Step to pattern;Forwards Corporate treasurer: No Distance: 150  Trunk/Postural Assessment  Cervical Assessment Cervical Assessment: Within Functional Limits Thoracic Assessment Thoracic Assessment: Exceptions to [facility] (premorbid kyphosis) Lumbar Assessment Lumbar Assessment: Exceptions to [facility] (posterior tilt) Postural Control Postural Control: Deficits on evaluation (impaired balance reactions)  Balance Static Sitting Balance Static Sitting - Balance Support: Feet supported;Right upper extremity  supported;Left upper extremity supported Static Sitting - Level of Assistance: 5: Stand by assistance Dynamic Sitting Balance Dynamic Sitting - Balance Support: Right upper extremity supported;Left upper extremity supported;Feet supported Dynamic Sitting - Level of Assistance: 5: Stand by assistance Static Standing Balance Static Standing - Balance Support: Right upper extremity supported;Left upper extremity supported Static Standing - Level of Assistance: 4: Min assist Dynamic Standing Balance Dynamic Standing - Balance Support: Right upper extremity supported;Left upper extremity supported Dynamic Standing - Level of Assistance: 3: Mod assist Extremity Assessment  RUE Assessment RUE Assessment: Within Functional Limits LUE Assessment LUE Assessment: Exceptions to WFL LUE AROM (degrees) LUE Overall AROM Comments: shoulder flexion approx 70 degrees, elbow 10-150, wrist decreased flexion LUE PROM (degrees) LUE Overall PROM Comments: shoulder flexion 90 degrees before c/o pain LUE Strength LUE Overall Strength Comments: 2+ deltoid, 3 to 3+ elbow RLE Assessment RLE Assessment: Exceptions to [facility] RLE Strength RLE Overall Strength: Deficits;Due to pain;Due to premorbid status RLE Overall Strength Comments: R knee OA pain from previous knee injury 1987; 4-/5 overall LLE Assessment LLE Assessment: Exceptions to [facility] LLE Strength LLE Overall Strength: Deficits LLE Overall Strength Comments: 4-/5 overall except 2/5 ankle DF  FIM:  FIM - Banker Devices: Therapist, occupational: 3: Bed > Chair or W/C: Mod A (lift or lower assist);3: Chair or W/C > Bed: Mod A (lift or lower assist) FIM - Locomotion: Wheelchair Distance: 150 Locomotion: Wheelchair: 1: Total Assistance/staff pushes wheelchair (Pt<25%) FIM - Locomotion: Ambulation Locomotion: Ambulation Assistive Devices: Designer, industrial/product Ambulation/Gait Assistance: 3: Mod assist Locomotion:  Ambulation: 2: Travels 50 - 149 ft with moderate assistance (Pt: 50 - 74%) FIM - Locomotion: Stairs Locomotion: Building control surveyor: Hand rail - 2 Locomotion: Stairs: 1: Up and Down < 4 stairs with moderate assistance (Pt: 50 - 74%)   Refer to Care Plan for Long Term Goals  Recommendations for other services: None  Discharge Criteria: Patient will be discharged from PT if patient refuses treatment 3 consecutive times without medical reason, if treatment goals not met, if there is a change in medical status, if patient makes no progress towards goals or if patient is discharged from hospital.  The above assessment, treatment plan, treatment alternatives and goals were discussed and mutually agreed upon: by patient  [place] Desanctis 04/26/2012, 10:53 AM

## 2012-04-26 NOTE — Plan of Care (Signed)
Problem: RH BOWEL ELIMINATION Goal: RH STG MANAGE BOWEL WITH ASSISTANCE STG Manage Bowel with mod independence  Outcome: Not Progressing Patient with urinary retention.  Problem: RH BLADDER ELIMINATION Goal: RH STG MANAGE BLADDER WITH ASSISTANCE STG Manage Bladder With mod independence  Outcome: Not Progressing Patient unable to double void

## 2012-04-26 NOTE — Progress Notes (Signed)
Patient ID: MAYRANI KHAMIS, female   DOB: 03-26-1913, 79 y.o.   MRN: [id number] Subjective/Complaints: 79 y.o. right hand female with history of hypertension who lives alone in [place]. She uses a rolling walker in her home for ambulation. Admitted 04/21/2012 with progressive left-sided weakness over a seven-day period. MRI of the brain showed multifocal areas of acute cerebral infarct. These lie within the right MCA territory. 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 Patient did not receive TPA. Echocardiogram with ejection fraction of 65-70% without emboli. Carotid Dopplers with right 40-59% ICA stenosis. TEE showed no thrombus or vegetation.. Neurology services consulted maintained on Plavix therapy for stroke prophylaxis as well as subcutaneous heparin for DVT prophylaxis. Question atrial flutter 04/25/2012 at 5:35 AM EKG strip was not available. Contact to neurology services as well as consult to cardiology Dr. Jacinto Halim and advised to change Plavix to her Pradaxa. Her subcutaneous heparin was discontinued once Pradaxa was initiated  Nausea which occurs if she doesn't take PPI in am, vomited yesterday no abd pain Review of Systems  Respiratory: Negative for shortness of breath.   Cardiovascular: Negative for chest pain.  Gastrointestinal: Negative for heartburn, abdominal pain, diarrhea and constipation.  All other systems reviewed and are negative.   Objective: Vital Signs: Blood pressure 118/54, pulse 77, temperature 97.2 F (36.2 C), temperature source Oral, resp. rate 18, height 5' 4.96" (1.65 m), weight 54.4 kg (119 lb 14.9 oz), SpO2 100.00%. No results found. Results for orders placed during the hospital encounter of 04/25/12 (from the past 72 hour(s))  CBC     Status: None   Collection Time    04/25/12  4:19 PM      Result Value Range   WBC 8.4  4.0 - 10.5 K/uL   RBC 4.55  3.87 - 5.11 MIL/uL   Hemoglobin 12.0   12.0 - 15.0 g/dL   HCT 40.9  81.1 - 91.4 %   MCV 80.2  78.0 - 100.0 fL   MCH 26.4  26.0 - 34.0 pg   MCHC 32.9  30.0 - 36.0 g/dL   RDW 78.2  95.6 - 21.3 %   Platelets 262  150 - 400 K/uL  CREATININE, SERUM     Status: Abnormal   Collection Time    04/25/12  4:19 PM      Result Value Range   Creatinine, Ser 1.12 (*) 0.50 - 1.10 mg/dL   GFR calc non Af Amer 40 (*) >90 mL/min   GFR calc Af Amer 47 (*) >90 mL/min   Comment:            The eGFR has been calculated     using the CKD EPI equation.     This calculation has not been     validated in all clinical     situations.     eGFR's persistently     <90 mL/min signify     possible Chronic Kidney Disease.  CBC WITH DIFFERENTIAL     Status: Abnormal (Preliminary result)   Collection Time    04/26/12  5:35 AM      Result Value Range   WBC 8.3  4.0 - 10.5 K/uL   Comment: WHITE COUNT CONFIRMED ON SMEAR   RBC 4.21  3.87 - 5.11 MIL/uL   Hemoglobin 11.4 (*) 12.0 - 15.0 g/dL   HCT 08.6 (*) 57.8 - 46.9 %   MCV 80.0  78.0 - 100.0 fL  MCH 27.1  26.0 - 34.0 pg   MCHC 33.8  30.0 - 36.0 g/dL   RDW 40.1  02.7 - 25.3 %   Platelets 233  150 - 400 K/uL   Neutrophils Relative PENDING  43 - 77 %   Neutro Abs PENDING  1.7 - 7.7 K/uL   Band Neutrophils PENDING  0 - 10 %   Lymphocytes Relative PENDING  12 - 46 %   Lymphs Abs PENDING  0.7 - 4.0 K/uL   Monocytes Relative PENDING  3 - 12 %   Monocytes Absolute PENDING  0.1 - 1.0 K/uL   Eosinophils Relative PENDING  0 - 5 %   Eosinophils Absolute PENDING  0.0 - 0.7 K/uL   Basophils Relative PENDING  0 - 1 %   Basophils Absolute PENDING  0.0 - 0.1 K/uL   WBC Morphology PENDING     RBC Morphology PENDING     Smear Review PENDING     nRBC PENDING  0 /100 WBC   Metamyelocytes Relative PENDING     Myelocytes PENDING     Promyelocytes Absolute PENDING     Blasts PENDING    COMPREHENSIVE METABOLIC PANEL     Status: Abnormal   Collection Time    04/26/12  5:35 AM      Result Value Range   Sodium  132 (*) 135 - 145 mEq/L   Potassium 4.2  3.5 - 5.1 mEq/L   Chloride 97  96 - 112 mEq/L   CO2 24  19 - 32 mEq/L   Glucose, Bld 107 (*) 70 - 99 mg/dL   BUN 30 (*) 6 - 23 mg/dL   Creatinine, Ser 6.64  0.50 - 1.10 mg/dL   Calcium 9.3  8.4 - 40.3 mg/dL   Total Protein 5.8 (*) 6.0 - 8.3 g/dL   Albumin 3.1 (*) 3.5 - 5.2 g/dL   AST 40 (*) 0 - 37 U/L   ALT 22  0 - 35 U/L   Alkaline Phosphatase 48  39 - 117 U/L   Total Bilirubin 0.5  0.3 - 1.2 mg/dL   GFR calc non Af Amer 45 (*) >90 mL/min   GFR calc Af Amer 53 (*) >90 mL/min   Comment:            The eGFR has been calculated     using the CKD EPI equation.     This calculation has not been     validated in all clinical     situations.     eGFR's persistently     <90 mL/min signify     possible Chronic Kidney Disease.     HEENT: normal and poor dentition Cardio: RRR and no murmur Resp: CTA B/L and unlabored GI: BS positive and not distended Extremity:  No Edema Skin:   Intact Neuro: Alert/Oriented, Flat, Cranial Nerve II-XII normal, Abnormal Motor 2-/5 Left Delt,bi,tri,grip, 4-/5 Left HF,KE,Ankle DF/PF, Abnormal FMC Ataxic/ dec FMC, Inattention and Other Mild left visual neglect Musc/Skel:  Other R knee pain over patellar with resisted extension Gen NAD   Assessment/Plan: 1. Functional deficits secondary to Embolic R MCA infarct which require 3+ hours per day of interdisciplinary therapy in a comprehensive inpatient rehab setting. Physiatrist is providing close team supervision and 24 hour management of active medical problems listed below. Physiatrist and rehab team continue to assess barriers to discharge/monitor patient progress toward functional and medical goals. FIM:  Comprehension Comprehension Mode: Auditory Comprehension: 7-Follows complex conversation/direction: With no assist  Expression Expression Mode: Verbal Expression: 7-Expresses complex ideas: With no assist  Social  Interaction Social Interaction: 7-Interacts appropriately with others - No medications needed.  Problem Solving Problem Solving: 7-Solves complex problems: Recognizes & self-corrects  Memory Memory: 6-More than reasonable amt of time  Medical Problem List and Plan:  1. Suspected embolic Right MCA infarct  2. DVT Prophylaxis/Anticoagulation: Subcutaneous heparin discontinued today after Pradaxa initiated. Monitor for any signs of bleeding. Minimize fall risks  3. Neuropsych: This patient is capable of making decisions on his/her own behalf.  4. Hypertension/atrial flutter. Avapro 300 mg daily, hydrochlorothiazide 12.5 mg daily. Closely monitor bp/hr with increased mobility.Pradaxa initiated for atrial flutter as well as stroke prophylaxis.  -adjust regimen as needed  5. Hyperlipidemia. Zocor  6. Glaucoma. Continue eye drops as advised 7.  Chronic GERD will start PPI q am LOS (Days) 1 A FACE TO FACE EVALUATION WAS PERFORMED  KIRSTEINS,ANDREW E 04/26/2012, 8:18 AM

## 2012-04-26 NOTE — Progress Notes (Signed)
Occupational Therapy Session Note  Patient Details  Name: Sandra Larson MRN: [id number] Date of Birth: 1914/01/05  Today's Date: 04/26/2012 Time: 7829-5621 Time Calculation (min): 40 min  Short Term Goals: Week 1:  OT Short Term Goal 1 (Week 1): STG = LTGs due to short LOS  Skilled Therapeutic Interventions/Progress Updates:  Pt seen for 1:1 OT with focus on transfers and NM re-ed in of LUE.  Pt in bed upon arrival, reports having been nauseous earlier but feeling better.  Engaged in stand pivot transfer bed to w/c with mod assist (lifting) and cues to turn 90 degrees to Lt instead of 270 degrees to Rt.  Pt completed grooming in sitting at sink with setup assist.  Engaged in NM re-ed with focus on Lt thumb opposition with first two fingers, thumb flexion and extension, radial deviation and supination/pronation with grasping and releasing large peg.  Removed splint for exercise as it was impeding her ability to extend and oppose thumb to digits.  Pt required individual attention to each joint prior to incorporating them together to lift peg.  Pt then progressed to translating peg across body to place in bucket with increased time and support at elbow to decrease effects of gravity.  Donned splint and ensured proper fit, 2 fingers under splint, and returned pt to room with RN present.  Therapy Documentation Precautions:  Precautions Precautions: Fall;Shoulder;Knee Type of Shoulder Precautions: Minor subluxation L shoulder; keep supported Precaution Comments: R knee pain/OA Required Braces or Orthoses: Other Brace/Splint Other Brace/Splint: Lt wrist Restrictions Weight Bearing Restrictions: No Pain:  Pt with no c/o pain this session.  See FIM for current functional status  Therapy/Group: Individual Therapy  Leonette Monarch 04/26/2012, 2:55 PM

## 2012-04-26 NOTE — Evaluation (Signed)
Occupational Therapy Assessment and Plan  Patient Details  Name: Sandra Larson MRN: [id number] Date of Birth: [birth year]  OT Diagnosis: hemiplegia affecting non-dominant side, muscle weakness (generalized) and pain in joint Rehab Potential: Rehab Potential: Good ELOS: 10 days   Today's Date: 04/26/2012 Time: 0730-0830 Time Calculation (min): 60 min  Problem List:  Patient Active Problem List  Diagnosis  . CVA (cerebral infarction)  . HTN (hypertension)  . Atrial fibrillation  . Embolic infarction    Past Medical History:  Past Medical History  Diagnosis Date  . Hypertension   . Glaucoma    Past Surgical History:  Past Surgical History  Procedure Laterality Date  . Appendectomy    . Cataract extraction    . Esophagogastroduodenoscopy (egd) with esophageal dilation    . Tee without cardioversion N/A 04/24/2012    Procedure: TRANSESOPHAGEAL ECHOCARDIOGRAM (TEE);  Surgeon: Thurmon Fair, MD;  Location: [facility] ENDOSCOPY;  Service: Cardiovascular;  Laterality: N/A;    Assessment & Plan Clinical Impression: Patient is a 79 y.o. right hand female with history of hypertension who lives alone in [place]. She uses a rolling walker in her home for ambulation. Admitted 04/21/2012 with progressive left-sided weakness over a seven-day period. MRI of the brain showed multifocal areas of acute cerebral infarct. These lie within the right MCA territory. 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 Patient did not receive TPA. Echocardiogram with ejection fraction of 65-70% without emboli. Carotid Dopplers with right 40-59% ICA stenosis. TEE showed no thrombus or vegetation.. Neurology services consulted maintained on Plavix therapy for stroke prophylaxis as well as subcutaneous heparin for DVT prophylaxis. Question atrial flutter 04/25/2012 at 5:35 AM EKG strip was not available. Contact to neurology services as well as consult to  cardiology Dr. Jacinto Halim and advised to change Plavix to her Pradaxa. Her subcutaneous heparin was discontinued once Pradaxa was initiated. Patient remained asymptomatic. Physical and occupational therapy evaluation completed 04/22/2012 an ongoing.    Patient transferred to CIR on 04/25/2012 .    Patient currently requires mod with basic self-care skills secondary to muscle weakness, abnormal tone, unbalanced muscle activation and decreased coordination and decreased standing balance, decreased postural control, hemiplegia and decreased balance strategies.  Prior to hospitalization, patient could complete ADLs with modified independent .  Patient will benefit from skilled intervention to increase independence with basic self-care skills and increase level of independence with iADL prior to discharge home independently.  Anticipate patient will require intermittent supervision and follow up outpatient.  OT - End of Session Activity Tolerance: Tolerates 30+ min activity without fatigue Endurance Deficit: No Endurance Deficit Description: nausea/vomiting OT Assessment Rehab Potential: Good OT Plan OT Intensity: Minimum of 1-2 x/day, 45 to 90 minutes OT Frequency: 5 out of 7 days OT Duration/Estimated Length of Stay: 7-10 days OT Treatment/Interventions: Balance/vestibular training;Discharge planning;Disease Conservator, museum/gallery;Functional mobility training;Neuromuscular re-education;Pain management;Patient/family education;Psychosocial support;Self Care/advanced ADL retraining;Splinting/orthotics;Therapeutic Activities;Therapeutic Exercise;UE/LE Strength taining/ROM;UE/LE Coordination activities OT Recommendation Patient destination: Home Follow Up Recommendations: Outpatient OT Equipment Recommended: 3 in 1 bedside comode (TBD)   Skilled Therapeutic Intervention OT eval completed, ADL assessment with bathing at sit <> stand level at sink. Pt transferring to [facility]  with RN upon arrival, completed toileting with mod assist to maintain standing and physical assist to pull up underwear.  Ambulated with hand held assist approx 8 feet.  Pt with spontaneous use of LUE with bathing, however required additional assistance for thoroughness against gravity (pt  with approx 70 degree Lt shoulder flexion).  LUE strength grossly 2+ in deltoid and 3 to 3+ at elbow.  Pt requires increased time and slight lifting assistance with sit <> stand due to BLE weakness and Rt knee pain from previous injury in 1987.  OT Evaluation Precautions/Restrictions  Precautions Precautions: Fall;Shoulder;Knee Type of Shoulder Precautions: Minor subluxation L shoulder; keep supported Precaution Comments: R knee pain/OA Required Braces or Orthoses: Other Brace/Splint Other Brace/Splint: Lt wrist Restrictions Weight Bearing Restrictions: No General   Vital Signs Therapy Vitals Pulse Rate: 80 (after stair negotiation) Patient Position, if appropriate: Sitting Pain Pain Assessment Pain Assessment: 0-10 Pain Score:   5 Pain Type: Chronic pain Pain Location: Knee Pain Orientation: Right Pain Descriptors: Aching;Sore Pain Onset: With Activity Pain Intervention(s): RN made aware;Refused;Rest Multiple Pain Sites: Yes 2nd Pain Site Pain Score: 5 Pain Type: Acute pain Pain Location: Epigastric Pain Orientation: Medial Pain Descriptors: Discomfort;Other (Comment) (nausea) Pain Onset: Gradual Pain Intervention(s): RN made aware 3rd Pain Site Pain Score: 4 Pain Type: Acute pain Pain Location: Arm Pain Orientation: Left Pain Descriptors: Discomfort;Sore Pain Onset: Other (Comment) (when unsupported; minor subluxation noted) Pain Frequency: Occasional Pain Intervention(s): Repositioned Home Living/Prior Functioning Home Living Lives With: Alone Available Help at Discharge: Family;Available PRN/intermittently Type of Home: House Home Access: Stairs to enter [employer] of Steps: 3 Entrance Stairs-Rails: Right;Left Home Layout: One level Bathroom Shower/Tub: Walk-in shower;Door;Other (comment) (pt prefers to sponge bathe) Bathroom Toilet: Standard Bathroom Accessibility: Yes How Accessible: Accessible via walker Home Adaptive Equipment: Walker - rolling;Straight cane Additional Comments: Was using SPC in RUE until 6 weeks ago; feeling more unsteady; changed to RW IADL History Homemaking Responsibilities: Yes Meal Prep Responsibility: Primary Laundry Responsibility: Primary Prior Function Level of Independence: Independent with homemaking with ambulation;Independent with gait;Independent with transfers;Requires assistive device for independence;Independent with basic ADLs Able to Take Stairs?: Yes Driving: No Vocation: Retired ADL ADL Grooming: Setup Where Assessed-Grooming: Sitting at sink Upper Body Bathing: Minimal assistance Where Assessed-Upper Body Bathing: Sitting at sink Lower Body Bathing: Minimal assistance Where Assessed-Lower Body Bathing: Sitting at sink;Standing at sink Upper Body Dressing: Not assessed Lower Body Dressing: Not assessed Toileting: Moderate assistance Where Assessed-Toileting: Bedside Commode Toilet Transfer: Minimal assistance Toilet Transfer Method: Stand pivot Toilet Transfer Equipment: Bedside commode Vision/Perception  Vision - History Baseline Vision: Wears glasses all the time Visual History: Glaucoma Patient Visual Report: No change from baseline Vision - Assessment Eye Alignment: Within Functional Limits Vision Assessment: Vision tested Ocular Range of Motion: Within Functional Limits Tracking/Visual Pursuits: Decreased smoothness of eye movement to RIGHT superior field;Decreased smoothness of eye movement to LEFT superior field Saccades: Additional eye shifts occurred during testing Convergence: Impaired (comment) Visual Fields: No apparent deficits Perception Perception: Within  Functional Limits Praxis Praxis: Intact  Cognition Overall Cognitive Status: Appears within functional limits for tasks assessed Arousal/Alertness: Awake/alert Orientation Level: Oriented X4 Memory: Appears intact Awareness: Appears intact Safety/Judgment: Appears intact Sensation Sensation Light Touch: Appears Intact Stereognosis: Not tested Hot/Cold: Not tested Proprioception: Appears Intact Coordination Gross Motor Movements are Fluid and Coordinated: Yes (in LLE; not coordinated in LUE secondary to weakness) Fine Motor Movements are Fluid and Coordinated: No Coordination and Movement Description: impaired by weakness Motor  Motor Motor: Hemiplegia;Abnormal tone Mobility  Bed Mobility Bed Mobility: Not assessed  Trunk/Postural Assessment  Cervical Assessment Cervical Assessment: Within Functional Limits Thoracic Assessment Thoracic Assessment: Exceptions to [facility] (premorbid kyphosis) Lumbar Assessment Lumbar Assessment: Exceptions to [facility] (posterior tilt) Postural Control Postural Control: Deficits on evaluation (  impaired balance reactions)  Balance Static Sitting Balance Static Sitting - Balance Support: Feet supported;Right upper extremity supported;Left upper extremity supported Static Sitting - Level of Assistance: 5: Stand by assistance Dynamic Sitting Balance Dynamic Sitting - Balance Support: Right upper extremity supported;Left upper extremity supported;Feet supported Dynamic Sitting - Level of Assistance: 5: Stand by assistance Static Standing Balance Static Standing - Balance Support: Right upper extremity supported;Left upper extremity supported Static Standing - Level of Assistance: 4: Min assist Dynamic Standing Balance Dynamic Standing - Balance Support: Right upper extremity supported;Left upper extremity supported Dynamic Standing - Level of Assistance: 3: Mod assist Extremity/Trunk Assessment RUE Assessment RUE Assessment: Within Functional  Limits LUE Assessment LUE Assessment: Exceptions to WFL LUE AROM (degrees) LUE Overall AROM Comments: shoulder flexion approx 70 degrees, elbow 10-150, wrist decreased flexion LUE PROM (degrees) LUE Overall PROM Comments: shoulder flexion 90 degrees before c/o pain LUE Strength LUE Overall Strength Comments: 2+ deltoid, 3 to 3+ elbow  FIM:  FIM - Grooming Grooming Steps: Wash, rinse, dry face;Wash, rinse, dry hands;Oral care, brush teeth, clean dentures;Brush, comb hair Grooming: 5: Set-up assist to apply toothpaste FIM - Bathing Bathing Steps Patient Completed: Chest;Left Arm;Abdomen;Front perineal area;Buttocks;Right upper leg;Left upper leg;Right lower leg (including foot) Bathing: 4: Min-Patient completes 8-9 100f 10 parts or 75+ percent FIM - Upper Body Dressing/Undressing Upper body dressing/undressing: 0: Wears gown/pajamas-no public clothing FIM - Lower Body Dressing/Undressing Lower body dressing/undressing steps patient completed: Pull underwear up/down Lower body dressing/undressing: 2: Max-Patient completed 25-49% of tasks FIM - Toileting Toileting steps completed by patient: Performs perineal hygiene Toileting: 2: Max-Patient completed 1 of 3 steps FIM - Banker Devices: Therapist, occupational: 3: Bed > Chair or W/C: Mod A (lift or lower assist);3: Chair or W/C > Bed: Mod A (lift or lower assist) FIM - Diplomatic Services operational officer Devices: Bedside commode Toilet Transfers: 3-To toilet/BSC: Mod A (lift or lower assist);3-From toilet/BSC: Mod A (lift or lower assist)   Refer to Care Plan for Long Term Goals  Recommendations for other services: None  Discharge Criteria: Patient will be discharged from OT if patient refuses treatment 3 consecutive times without medical reason, if treatment goals not met, if there is a change in medical status, if patient makes no progress towards goals or if patient is discharged  from hospital.  The above assessment, treatment plan, treatment alternatives and goals were discussed and mutually agreed upon: by patient  Leonette Monarch 04/26/2012, 10:58 AM

## 2012-04-26 NOTE — Progress Notes (Signed)
Physical Therapy Session Note  Patient Details  Name: Sandra Larson MRN: [id number] Date of Birth: March 10, 1913  Today's Date: 04/26/2012 Time: 1130-1205 Time Calculation (min): 35 min  Short Term Goals: Week 1:  PT Short Term Goal 1 (Week 1): = LTG of mod I overall  Therapy Documentation Precautions:  Precautions Precautions: Fall;Shoulder;Knee Type of Shoulder Precautions: Minor subluxation L shoulder; keep supported Precaution Comments: R knee pain/OA Required Braces or Orthoses: Other Brace/Splint Other Brace/Splint: Lt wrist Restrictions Weight Bearing Restrictions: No General: Pt daughter and granddaughter present and brought clothing.  Reviewed LOS, D/C date and overall goals with family, falls risk and AD recommendation.  Also discussed family education plan to involve family prior to D/C, pain management for R knee (family reports that Voltaren gel did not relieve pain PTA and asking about neoprene knee brace for support) and prognosis for UE function.  Discussed all concerns with family.  Will discuss knee brace with MD. Vital Signs: Therapy Vitals Pulse Rate: 80 (after stair negotiation) Patient Position, if appropriate: Sitting Pain: Pain Assessment Pain Assessment: 0-10 Pain Score:   5 Pain Type: Chronic pain Pain Location: Knee Pain Orientation: Right Pain Descriptors: Aching;Sore Pain Onset: With Activity Pain Intervention(s): RN made aware;Refused;Rest Multiple Pain Sites: Yes 2nd Pain Site Pain Score: 5 Pain Type: Acute pain Pain Location: Epigastric Pain Orientation: Medial Pain Descriptors: Discomfort;Other (Comment) (nausea) Pain Onset: Gradual Pain Intervention(s): RN made aware 3rd Pain Site Pain Score: 4 Pain Type: Acute pain Pain Location: Arm Pain Orientation: Left Pain Descriptors: Discomfort;Sore Pain Onset: Other (Comment) (when unsupported; minor subluxation noted) Pain Frequency: Occasional Pain Intervention(s):  Repositioned Mobility: Patient resting in bed; required mod A for supine>sit with HOB elevated and bed rail; at EOB assisted patient with donning pants and performed standing to pull pants up and stand pivot bed > w/c with UE support on RW mod A.   Balance: Standardized Balance Assessment Standardized Balance Assessment: Berg Balance Test Berg Balance Test Sit to Stand: Needs moderate or maximal assist to stand Standing Unsupported: Able to stand 2 minutes with supervision Sitting with Back Unsupported but Feet Supported on Floor or Stool: Able to sit safely and securely 2 minutes Stand to Sit: Needs assistance to sit Transfers: Needs one person to assist Standing Unsupported with Eyes Closed: Able to stand 10 seconds with supervision Standing Ubsupported with Feet Together: Needs help to attain position but able to stand for 30 seconds with feet together From Standing, Reach Forward with Outstretched Arm: Can reach forward >12 cm safely (5") From Standing Position, Pick up Object from Floor: Able to pick up shoe, needs supervision From Standing Position, Turn to Look Behind Over each Shoulder: Turn sideways only but maintains balance Turn 360 Degrees: Needs assistance while turning Standing Unsupported, Alternately Place Feet on Step/Stool: Able to complete >2 steps/needs minimal assist Standing Unsupported, One Foot in Front: Needs help to step but can hold 15 seconds Standing on One Leg: Unable to try or needs assist to prevent fall Total Score: 22 Patient demonstrates increased fall risk as noted by score of 22/56 on Berg Balance Scale.  (<36= high risk for falls, close to 100%; 37-45 significant >80%; 46-51 moderate >50%; 52-55 lower >25%); discussed falls risk and AD recommendation with patient.    See FIM for current functional status  Therapy/Group: Individual Therapy  Edman Circle [facility] 04/26/2012, 12:22 PM

## 2012-04-26 NOTE — Progress Notes (Signed)
Social Work Assessment and Plan Social Work Assessment and Plan  Patient Details  Name: Sandra Larson MRN: [id number] Date of Birth: Jul 27, 1913  Today's Date: 04/26/2012  Problem List:  Patient Active Problem List  Diagnosis  . CVA (cerebral infarction)  . HTN (hypertension)  . Atrial fibrillation  . Embolic infarction   Past Medical History:  Past Medical History  Diagnosis Date  . Hypertension   . Glaucoma    Past Surgical History:  Past Surgical History  Procedure Laterality Date  . Appendectomy    . Cataract extraction    . Esophagogastroduodenoscopy (egd) with esophageal dilation    . Tee without cardioversion N/A 04/24/2012    Procedure: TRANSESOPHAGEAL ECHOCARDIOGRAM (TEE);  Surgeon: Thurmon Fair, MD;  Location: [facility] ENDOSCOPY;  Service: Cardiovascular;  Laterality: N/A;   Social History:  reports that she has never smoked. She does not have any smokeless tobacco history on file. She reports that she does not drink alcohol or use illicit drugs.  Family / Support Systems Marital Status: Widow/Widower Patient Roles: Parent Children: Gwinda Maine  [phone] Other Supports: Victorino Dike Moser-granddaughter [phone]  Melissa Taylor-granddaughter  [phone] Anticipated Caregiver: daughter Ability/Limitations of Caregiver: daughter has health issues Caregiver Availability: Intermittent Family Dynamics: Close knit small family who are there for one another-supportive and involved  Social History Preferred language: English Religion:  Cultural Background: No issues Education: High School Read: Yes Write: Yes Employment Status: Retired Fish farm manager Issues: No issues Guardian/Conservator: None-according to MD pt is capable of making her own decisions   Abuse/Neglect Physical Abuse: Denies Verbal Abuse: Denies Sexual Abuse: Denies Exploitation of patient/patient's resources: Denies Self-Neglect: Denies  Emotional  Status Pt's affect, behavior adn adjustment status: Pt is motivated and wants to regain her use of her arm and be able to return home independently.  Sh eis very self sufficient and wants to remain this way. Recent Psychosocial Issues: Other medical issues-fairly good health Pyschiatric History: No history-depression screen score-1.  She feels when you get her age anything can happen and she is ready.  She is very positive and optimistice regarding her recovery Substance Abuse History: No issues  Patient / Family Perceptions, Expectations & Goals Pt/Family understanding of illness & functional limitations: Pt is able to explain her stroke and reports her main issue is her left arm and hand.  She hopes to ge the use fo this back while here, but will learn to manage if not. Premorbid pt/family roles/activities: Mother, Grandmother, Retiree, Home owner, church member, etc Anticipated changes in roles/activities/participation: resume Pt/family expectations/goals: Pt states: " I want to be able to do for myself before I leave here."  " I will do what I need to do to recover."  [employer]: None Premorbid Home Care/DME Agencies: None Transportation available at discharge: Family provides Resource referrals recommended: Support group (specify) ([facility] Support group)  Discharge Planning Living Arrangements: Alone Support Systems: Children;Other relatives;Friends/neighbors;Church/faith community Type of Residence: Private residence Insurance Resources: Medicare;Private Insurance (specify) Building surveyor) Financial Resources: Restaurant manager, fast food Screen Referred: No Living Expenses: Own Money Management: Patient Do you have any problems obtaining your medications?: No Home Management: Self and daughter helps some Patient/Family Preliminary Plans: Return home with assistance if necessary, granddaguhter could assist-she is currenlty not working.  Daughter will check  on daily and provide transportation to appt and errands Social Work Anticipated Follow Up Needs: HH/OP;Support Group  Clinical Impression Very pleasant 79 year old female who is doing well.  Supportive and involved family, should  do well here and be a short length of stay.  Await team's evaluations  Lucy Chris 04/26/2012, 8:55 AM

## 2012-04-26 NOTE — Care Management Note (Signed)
[facility]patient [facility] Individual Statement of Services  Patient Name:  Sandra Larson  Date:  04/26/2012  Welcome to the [facility]patient [facility].  Our goal is to provide you with an individualized program based on your diagnosis and situation, designed to meet your specific needs.  With this comprehensive rehabilitation program, you will be expected to participate in at least 3 hours of rehabilitation therapies Monday-Friday, with modified therapy programming on the weekends.  Your rehabilitation program will include the following services:  Physical Therapy (PT), Occupational Therapy (OT), 24 hour per day rehabilitation nursing, Case Management ( Social Worker), Rehabilitation Medicine, Nutrition Services and Pharmacy Services  Weekly team conferences will be held on Wednesday to discuss your progress.  Your Social Worker will talk with you frequently to get your input and to update you on team discussions.  Team conferences with you and your family in attendance may also be held.  Expected length of stay: 7-10 days Overall anticipated outcome: Supervision/mod/i level  Depending on your progress and recovery, your program may change. Your Social Worker will coordinate services and will keep you informed of any changes. Your Child psychotherapist names and contact numbers are listed  below.  The following services may also be recommended but are not provided by the Inpatient Rehabilitation Center:    Home Health Rehabiltiation Services  Outpatient Rehabilitatation Servives   Arrangements will be made to provide these services after discharge if needed.  Arrangements include referral to agencies that provide these services.  Your insurance has been verified to be:  Medicare & [employer] Your primary doctor is:  Dr. Dina Rich  Pertinent information will be shared with your doctor and your insurance company.  Social Worker:  Dossie Der, [place] [phone]  Information discussed  with and copy given to patient by: Lucy Chris, 04/26/2012, 8:44 AM

## 2012-04-27 ENCOUNTER — Inpatient Hospital Stay (HOSPITAL_COMMUNITY): Payer: Medicare Other | Admitting: Occupational Therapy

## 2012-04-27 ENCOUNTER — Inpatient Hospital Stay (HOSPITAL_COMMUNITY): Payer: Medicare Other | Admitting: *Deleted

## 2012-04-27 ENCOUNTER — Inpatient Hospital Stay (HOSPITAL_COMMUNITY): Payer: Medicare Other | Admitting: Physical Therapy

## 2012-04-27 DIAGNOSIS — I6340 Cerebral infarction due to embolism of unspecified cerebral artery: Secondary | ICD-10-CM

## 2012-04-27 DIAGNOSIS — I4891 Unspecified atrial fibrillation: Secondary | ICD-10-CM

## 2012-04-27 NOTE — Evaluation (Signed)
SLP reviewed and agree with student findings.   Mabrey Howland MA, CCC-SLP [phone]    

## 2012-04-27 NOTE — Progress Notes (Signed)
Occupational Therapy Session Note  Patient Details  Name: KRYSLYN HELBIG MRN: [id number] Date of Birth: 07/01/1913  Today's Date: 04/27/2012 Time: 0732-0832 and 0272-5366 Time Calculation (min): 60 min and 47 min  Short Term Goals: Week 1:  OT Short Term Goal 1 (Week 1): STG = LTGs due to short LOS  Skilled Therapeutic Interventions/Progress Updates:   1) Pt seen for ADL retraining with focus on functional mobility, transfers, sit <> stand, and LUE use during self-care tasks of bathing and dressing.  Focus on opening containers to finish breakfast, with pt requiring assistance secondary to LUE weakness and decreased grasp.  Sit to stand from EOB with cues for hand placement to increase safety and weight shifting forward.  Ambulated with min assist to toilet with RW with focus on increased use of LUE with pulling pants up and down.  Completed bathing and dressing at sit to stand level at sink with pt with increased spontaneous use of LUE with washing and use of step stool to aid in washing both feet.  Throughout session pt's sit to stand improved from min assist to supervision with increased time.  Educated pt on keeping shirt buttoned and donning/doffing over head like pull over shirt to minimize frustration with buttoning.  Discussed shoe button or elastic shoelaces as pt reports she "won't be able" to tie her shoes.    2) Pt seen for 1:1 OT with focus on NM re-ed with gross motor control and FMC.  Engaged in stand pivot transfer to therapy mat with pt requiring min assist for stability.  Engaged in BUE strengthening and motor control with therapy ball with focus on shoulder flexion and elbow extension while maintaining appropriate contact with ball with LUE.  Pt with decreased shoulder ROM against gravity, requiring occasional steady assist at elbow to increase control.  Grasp and release with large checkers with cues for thumb movement.  Stacking cups with focus on crossing midline and  pronation/supination for increased control and manipulation of cups.  Discussed increased use of LUE with functional tasks of opening containers, i.e. toothpaste to increase functional use.  Pt's daughter and granddaughter present at end of session, discussed increased control of LUE and encouraged them to have her incorporate LUE use in any task when they are present.  Therapy Documentation Precautions:  Precautions Precautions: Fall;Shoulder;Knee Type of Shoulder Precautions: Minor subluxation L shoulder; keep supported Precaution Comments: R knee pain/OA Required Braces or Orthoses: Other Brace/Splint Other Brace/Splint: Lt wrist Restrictions Weight Bearing Restrictions: No General:   Vital Signs: Therapy Vitals Temp: 98 F (36.7 C) Temp src: Oral Pulse Rate: 74 Resp: 18 BP: 126/61 mmHg Patient Position, if appropriate: Sitting Oxygen Therapy SpO2: 98 % O2 Device: None (Room air) Pain: Pain Assessment Pain Assessment: No/denies pain  See FIM for current functional status  Therapy/Group: Individual Therapy  Leonette Monarch 04/27/2012, 8:38 AM

## 2012-04-27 NOTE — Progress Notes (Signed)
Patient ID: Sandra Larson, female   DOB: 20-Aug-1913, 79 y.o.   MRN: [id number] Subjective/Complaints: 79 y.o. right hand female with history of hypertension who lives alone in [place]. She uses a rolling walker in her home for ambulation. Admitted 04/21/2012 with progressive left-sided weakness over a seven-day period. MRI of the brain showed multifocal areas of acute cerebral infarct. These lie within the right MCA territory. 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 Patient did not receive TPA. Echocardiogram with ejection fraction of 65-70% without emboli. Carotid Dopplers with right 40-59% ICA stenosis. TEE showed no thrombus or vegetation.. Neurology services consulted maintained on Plavix therapy for stroke prophylaxis as well as subcutaneous heparin for DVT prophylaxis. Question atrial flutter 04/25/2012 at 5:35 AM EKG strip was not available. Contact to neurology services as well as consult to cardiology Dr. Jacinto Halim and advised to change Plavix to her Pradaxa. Her subcutaneous heparin was discontinued once Pradaxa was initiated  Nausea which occurs if she doesn't take PPI in am, vomited yesterday no abd pain Review of Systems  Respiratory: Negative for shortness of breath.   Cardiovascular: Negative for chest pain.  Gastrointestinal: Negative for heartburn, abdominal pain, diarrhea and constipation.  All other systems reviewed and are negative.   Objective: Vital Signs: Blood pressure 131/45, pulse 74, temperature 98 F (36.7 C), temperature source Oral, resp. rate 18, height 5' 4.96" (1.65 m), weight 60.6 kg (133 lb 9.6 oz), SpO2 98.00%. No results found. Results for orders placed during the hospital encounter of 04/25/12 (from the past 72 hour(s))  CBC     Status: None   Collection Time    04/25/12  4:19 PM      Result Value Range   WBC 8.4  4.0 - 10.5 K/uL   RBC 4.55  3.87 - 5.11 MIL/uL   Hemoglobin 12.0  12.0  - 15.0 g/dL   HCT 40.9  81.1 - 91.4 %   MCV 80.2  78.0 - 100.0 fL   MCH 26.4  26.0 - 34.0 pg   MCHC 32.9  30.0 - 36.0 g/dL   RDW 78.2  95.6 - 21.3 %   Platelets 262  150 - 400 K/uL  CREATININE, SERUM     Status: Abnormal   Collection Time    04/25/12  4:19 PM      Result Value Range   Creatinine, Ser 1.12 (*) 0.50 - 1.10 mg/dL   GFR calc non Af Amer 40 (*) >90 mL/min   GFR calc Af Amer 47 (*) >90 mL/min   Comment:            The eGFR has been calculated     using the CKD EPI equation.     This calculation has not been     validated in all clinical     situations.     eGFR's persistently     <90 mL/min signify     possible Chronic Kidney Disease.  CBC WITH DIFFERENTIAL     Status: Abnormal   Collection Time    04/26/12  5:35 AM      Result Value Range   WBC 8.3  4.0 - 10.5 K/uL   Comment: WHITE COUNT CONFIRMED ON SMEAR   RBC 4.21  3.87 - 5.11 MIL/uL   Hemoglobin 11.4 (*) 12.0 - 15.0 g/dL   HCT 08.6 (*) 57.8 - 46.9 %   MCV 80.0  78.0 - 100.0 fL  MCH 27.1  26.0 - 34.0 pg   MCHC 33.8  30.0 - 36.0 g/dL   RDW 40.9  81.1 - 91.4 %   Platelets 233  150 - 400 K/uL   Neutrophils Relative 70  43 - 77 %   Lymphocytes Relative 19  12 - 46 %   Monocytes Relative 8  3 - 12 %   Eosinophils Relative 3  0 - 5 %   Basophils Relative 0  0 - 1 %   Neutro Abs 5.7  1.7 - 7.7 K/uL   Lymphs Abs 1.6  0.7 - 4.0 K/uL   Monocytes Absolute 0.7  0.1 - 1.0 K/uL   Eosinophils Absolute 0.3  0.0 - 0.7 K/uL   Basophils Absolute 0.0  0.0 - 0.1 K/uL   Smear Review MORPHOLOGY UNREMARKABLE    COMPREHENSIVE METABOLIC PANEL     Status: Abnormal   Collection Time    04/26/12  5:35 AM      Result Value Range   Sodium 132 (*) 135 - 145 mEq/L   Potassium 4.2  3.5 - 5.1 mEq/L   Chloride 97  96 - 112 mEq/L   CO2 24  19 - 32 mEq/L   Glucose, Bld 107 (*) 70 - 99 mg/dL   BUN 30 (*) 6 - 23 mg/dL   Creatinine, Ser 7.82  0.50 - 1.10 mg/dL   Calcium 9.3  8.4 - 95.6 mg/dL   Total Protein 5.8 (*) 6.0 - 8.3 g/dL    Albumin 3.1 (*) 3.5 - 5.2 g/dL   AST 40 (*) 0 - 37 U/L   ALT 22  0 - 35 U/L   Alkaline Phosphatase 48  39 - 117 U/L   Total Bilirubin 0.5  0.3 - 1.2 mg/dL   GFR calc non Af Amer 45 (*) >90 mL/min   GFR calc Af Amer 53 (*) >90 mL/min   Comment:            The eGFR has been calculated     using the CKD EPI equation.     This calculation has not been     validated in all clinical     situations.     eGFR's persistently     <90 mL/min signify     possible Chronic Kidney Disease.     HEENT: normal and poor dentition Cardio: RRR and no murmur Resp: CTA B/L and unlabored GI: BS positive and not distended Extremity:  No Edema Skin:   Intact Neuro: Alert/Oriented, Flat, Cranial Nerve II-XII normal, Abnormal Motor 2-/5 Left Delt,bi,tri,grip, 4-/5 Left HF,KE,Ankle DF/PF, Abnormal FMC Ataxic/ dec FMC, Inattention and Other Mild left visual neglect Musc/Skel:  Other R knee pain over patellar with resisted extension Gen NAD   Assessment/Plan: 1. Functional deficits secondary to Embolic R MCA infarct which require 3+ hours per day of interdisciplinary therapy in a comprehensive inpatient rehab setting. Physiatrist is providing close team supervision and 24 hour management of active medical problems listed below. Physiatrist and rehab team continue to assess barriers to discharge/monitor patient progress toward functional and medical goals. FIM: FIM - Bathing Bathing Steps Patient Completed: Chest;Left Arm;Abdomen;Front perineal area;Buttocks;Right upper leg;Left upper leg;Right lower leg (including foot) Bathing: 4: Min-Patient completes 8-9 9f 10 parts or 75+ percent  FIM - Upper Body Dressing/Undressing Upper body dressing/undressing: 0: Wears gown/pajamas-no public clothing FIM - Lower Body Dressing/Undressing Lower body dressing/undressing steps patient completed: Pull underwear up/down Lower body dressing/undressing: 2: Max-Patient completed 25-49% of tasks  FIM -  Toileting Toileting steps completed by patient: Performs perineal hygiene Toileting: 2: Max-Patient completed 1 of 3 steps  FIM - Diplomatic Services operational officer Devices: Psychiatrist Transfers: 3-To toilet/BSC: Mod A (lift or lower assist);3-From toilet/BSC: Mod A (lift or lower assist)  FIM - Banker Devices: Therapist, occupational: 3: Bed > Chair or W/C: Mod A (lift or lower assist);3: Chair or W/C > Bed: Mod A (lift or lower assist)  FIM - Locomotion: Wheelchair Distance: 150 Locomotion: Wheelchair: 1: Total Assistance/staff pushes wheelchair (Pt<25%) FIM - Locomotion: Ambulation Locomotion: Ambulation Assistive Devices: Designer, industrial/product Ambulation/Gait Assistance: 3: Mod assist Locomotion: Ambulation: 2: Travels 50 - 149 ft with moderate assistance (Pt: 50 - 74%)  Comprehension Comprehension Mode: Auditory Comprehension: 6-Follows complex conversation/direction: With extra time/assistive device  Expression Expression Mode: Verbal Expression: 7-Expresses complex ideas: With no assist  Social Interaction Social Interaction: 7-Interacts appropriately with others - No medications needed.  Problem Solving Problem Solving: 6-Solves complex problems: With extra time  Memory Memory: 6-More than reasonable amt of time  Medical Problem List and Plan:  1. Suspected embolic Right MCA infarct  2. DVT Prophylaxis/Anticoagulation: Subcutaneous heparin discontinued today after Pradaxa initiated. Monitor for any signs of bleeding. Minimize fall risks  3. Neuropsych: This patient is capable of making decisions on his/her own behalf.  4. Hypertension/atrial flutter. Avapro 300 mg daily, hydrochlorothiazide 12.5 mg daily. Closely monitor bp/hr with increased mobility.Pradaxa initiated for atrial flutter as well as stroke prophylaxis.  -adjust regimen as needed  5. Hyperlipidemia. Zocor  6. Glaucoma. Continue eye drops as  advised 7.  Chronic GERD will start PPI q am LOS (Days) 2 A FACE TO FACE EVALUATION WAS PERFORMED  Petrice Beedy E 04/27/2012, 7:30 AM

## 2012-04-27 NOTE — Progress Notes (Signed)
Physical Therapy Session Note  Patient Details  Name: Sandra Larson MRN: [id number] Date of Birth: 08-27-1913  Today's Date: 04/27/2012 Time: 4098-1191 Time Calculation (min): 30 min  Short Term Goals: Week 1:  PT Short Term Goal 1 (Week 1): = LTG of mod I overall  Skilled Therapeutic Interventions/Progress Updates:  Tx focused on gait training, transfers, and balance. Sit<>stand x5 from various surfaces with up to Mod A for lifting, cues for scooting to edge and anterior translation. Difficulty due to knee and weakness.  Gait training in controlled environment and on carpet 3x75' with min-guard A and cues for posture as able, limited by back pain.  Gait training around obstacles with tight turns, over obstacles and challenged to pick up object from floor.  Performed step-taps to 3" step with R/L 2x10 with LUE assist only and min-guard.  Stand-step transfers x2 with Min A.  Sit>supine with Min A Bridging to adjust in bed x6 with cues for technique.  Pt left with all needs in reach, bed alarm on and family present.      Therapy Documentation Precautions:  Precautions Precautions: Fall;Shoulder;Knee Type of Shoulder Precautions: Minor subluxation L shoulder; keep supported Precaution Comments: R knee pain/OA Required Braces or Orthoses: Other Brace/Splint Other Brace/Splint: Lt wrist Restrictions Weight Bearing Restrictions: No Pain:Knee discomfort, modified positioning prn     Locomotion : Ambulation Ambulation/Gait Assistance: 4: Min guard   See FIM for current functional status  Therapy/Group: Individual Therapy Clydene Laming, PT, DPT   Eulogio Ditch, Richardson Dopp M 04/27/2012, 3:25 PM

## 2012-04-27 NOTE — Progress Notes (Signed)
Physical Therapy Session Note  Patient Details  Name: Sandra Larson MRN: [id number] Date of Birth: May 07, 1913  Today's Date: 04/27/2012 Time: 0830-0925 Time Calculation (min): 55 min  Short Term Goals: Week 1:  PT Short Term Goal 1 (Week 1): = LTG of mod I overall  Skilled Therapeutic Interventions/Progress Updates:    Ambulation x 50' with RW, Rt. Wrist splint and min assist working forwards, backwards, and side stepping gait. Cues for sequencing, pt demonstrates good carryover of cues. Demonstrated and practiced scooting to edge of chair and good anterior translation of trunk into standing/sitting to decrease joint reaction forces through knees. Standing single limb balance activity on LT. LE with Rt. LE up on block, bil. UE lifts with Red weighted ball. Min assist for balance. Biodex "limits of stability" x 2 bouts pt scored 19% then 20% on activity. Pt demonstrates decreased ability to weight shift posteriorly, has decreased hip/knee strategies. Pt currently most limited by chronic Rt. Knee pain.   Therapy Documentation Precautions:  Precautions Precautions: Fall;Shoulder;Knee Type of Shoulder Precautions: Minor subluxation L shoulder; keep supported Precaution Comments: R knee pain/OA Required Braces or Orthoses: Other Brace/Splint Other Brace/Splint: Lt wrist Restrictions Weight Bearing Restrictions: No Pain: Pain Assessment Pain Score:   7 Pain Type: Chronic pain Pain Location: Knee Pain Orientation: Right Pain Descriptors: Aching Pain Onset: With Activity Pain Intervention(s): RN made aware (declined medication)  See FIM for current functional status  Therapy/Group: Individual Therapy  Wilhemina Bonito 04/27/2012, 12:07 PM

## 2012-04-28 ENCOUNTER — Inpatient Hospital Stay (HOSPITAL_COMMUNITY): Payer: Medicare Other | Admitting: Occupational Therapy

## 2012-04-28 ENCOUNTER — Inpatient Hospital Stay (HOSPITAL_COMMUNITY): Payer: Medicare Other

## 2012-04-28 ENCOUNTER — Inpatient Hospital Stay (HOSPITAL_COMMUNITY): Payer: Medicare Other | Admitting: Physical Therapy

## 2012-04-28 LAB — BASIC METABOLIC PANEL
GFR calc Af Amer: 34 mL/min — ABNORMAL LOW (ref >90)
GFR calc non Af Amer: 29 mL/min — ABNORMAL LOW (ref >90)
Glucose, Bld: 98 mg/dL (ref 70–99)
Potassium: 4.1 mEq/L (ref 3.5–5.1)
Sodium: 132 mEq/L — ABNORMAL LOW (ref 135–145)

## 2012-04-28 MED ORDER — SENNOSIDES-DOCUSATE SODIUM 8.6-50 MG PO TABS
1.0000 | ORAL_TABLET | Freq: Every day | ORAL | Status: DC
  Administered 2012-04-28 – 2012-05-04 (×7): 1 via ORAL
  Filled 2012-04-28 (×6): qty 1

## 2012-04-28 NOTE — Progress Notes (Signed)
Patient ID: Sandra Larson, female   DOB: Jul 07, 1913, 79 y.o.   MRN: [id number] Subjective/Complaints: 79 y.o. right hand female with history of hypertension who lives alone in [place]. She uses a rolling walker in her home for ambulation. Admitted 04/21/2012 with progressive left-sided weakness over a seven-day period. MRI of the brain showed multifocal areas of acute cerebral infarct. These lie within the right MCA territory. 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 Patient did not receive TPA. Echocardiogram with ejection fraction of 65-70% without emboli. Carotid Dopplers with right 40-59% ICA stenosis. TEE showed no thrombus or vegetation.. Neurology services consulted maintained on Plavix therapy for stroke prophylaxis as well as subcutaneous heparin for DVT prophylaxis. Question atrial flutter 04/25/2012 at 5:35 AM EKG strip was not available. Contact to neurology services as well as consult to cardiology Dr. Jacinto Halim and advised to change Plavix to her Pradaxa. Her subcutaneous heparin was discontinued once Pradaxa was initiated  Right knee pain, remote patellar injury, some relief with voltaren gel.  Pt without new issus except constipation today, last recorded BM 4/7 Review of Systems  Respiratory: Negative for shortness of breath.   Cardiovascular: Negative for chest pain.  Gastrointestinal: Negative for heartburn, abdominal pain and diarrhea.  All other systems reviewed and are negative.   Objective: Vital Signs: Blood pressure 91/63, pulse 91, temperature 97.9 F (36.6 C), temperature source Oral, resp. rate 18, height 5' 4.96" (1.65 m), weight 58 kg (127 lb 13.9 oz), SpO2 94.00%. No results found. Results for orders placed during the hospital encounter of 04/25/12 (from the past 72 hour(s))  CBC     Status: None   Collection Time    04/25/12  4:19 PM      Result Value Range   WBC 8.4  4.0 - 10.5 K/uL   RBC  4.55  3.87 - 5.11 MIL/uL   Hemoglobin 12.0  12.0 - 15.0 g/dL   HCT 40.9  81.1 - 91.4 %   MCV 80.2  78.0 - 100.0 fL   MCH 26.4  26.0 - 34.0 pg   MCHC 32.9  30.0 - 36.0 g/dL   RDW 78.2  95.6 - 21.3 %   Platelets 262  150 - 400 K/uL  CREATININE, SERUM     Status: Abnormal   Collection Time    04/25/12  4:19 PM      Result Value Range   Creatinine, Ser 1.12 (*) 0.50 - 1.10 mg/dL   GFR calc non Af Amer 40 (*) >90 mL/min   GFR calc Af Amer 47 (*) >90 mL/min   Comment:            The eGFR has been calculated     using the CKD EPI equation.     This calculation has not been     validated in all clinical     situations.     eGFR's persistently     <90 mL/min signify     possible Chronic Kidney Disease.  CBC WITH DIFFERENTIAL     Status: Abnormal   Collection Time    04/26/12  5:35 AM      Result Value Range   WBC 8.3  4.0 - 10.5 K/uL   Comment: WHITE COUNT CONFIRMED ON SMEAR   RBC 4.21  3.87 - 5.11 MIL/uL   Hemoglobin 11.4 (*) 12.0 - 15.0 g/dL   HCT 08.6 (*) 57.8 - 46.9 %   MCV 80.0  78.0 - 100.0 fL   MCH 27.1  26.0 - 34.0 pg   MCHC 33.8  30.0 - 36.0 g/dL   RDW 16.1  09.6 - 04.5 %   Platelets 233  150 - 400 K/uL   Neutrophils Relative 70  43 - 77 %   Lymphocytes Relative 19  12 - 46 %   Monocytes Relative 8  3 - 12 %   Eosinophils Relative 3  0 - 5 %   Basophils Relative 0  0 - 1 %   Neutro Abs 5.7  1.7 - 7.7 K/uL   Lymphs Abs 1.6  0.7 - 4.0 K/uL   Monocytes Absolute 0.7  0.1 - 1.0 K/uL   Eosinophils Absolute 0.3  0.0 - 0.7 K/uL   Basophils Absolute 0.0  0.0 - 0.1 K/uL   Smear Review MORPHOLOGY UNREMARKABLE    COMPREHENSIVE METABOLIC PANEL     Status: Abnormal   Collection Time    04/26/12  5:35 AM      Result Value Range   Sodium 132 (*) 135 - 145 mEq/L   Potassium 4.2  3.5 - 5.1 mEq/L   Chloride 97  96 - 112 mEq/L   CO2 24  19 - 32 mEq/L   Glucose, Bld 107 (*) 70 - 99 mg/dL   BUN 30 (*) 6 - 23 mg/dL   Creatinine, Ser 4.09  0.50 - 1.10 mg/dL   Calcium 9.3  8.4 -  81.1 mg/dL   Total Protein 5.8 (*) 6.0 - 8.3 g/dL   Albumin 3.1 (*) 3.5 - 5.2 g/dL   AST 40 (*) 0 - 37 U/L   ALT 22  0 - 35 U/L   Alkaline Phosphatase 48  39 - 117 U/L   Total Bilirubin 0.5  0.3 - 1.2 mg/dL   GFR calc non Af Amer 45 (*) >90 mL/min   GFR calc Af Amer 53 (*) >90 mL/min   Comment:            The eGFR has been calculated     using the CKD EPI equation.     This calculation has not been     validated in all clinical     situations.     eGFR's persistently     <90 mL/min signify     possible Chronic Kidney Disease.  BASIC METABOLIC PANEL     Status: Abnormal   Collection Time    04/28/12  6:22 AM      Result Value Range   Sodium 132 (*) 135 - 145 mEq/L   Potassium 4.1  3.5 - 5.1 mEq/L   Chloride 97  96 - 112 mEq/L   CO2 28  19 - 32 mEq/L   Glucose, Bld 98  70 - 99 mg/dL   BUN 40 (*) 6 - 23 mg/dL   Creatinine, Ser 9.14 (*) 0.50 - 1.10 mg/dL   Calcium 9.1  8.4 - 78.2 mg/dL   GFR calc non Af Amer 29 (*) >90 mL/min   GFR calc Af Amer 34 (*) >90 mL/min   Comment:            The eGFR has been calculated     using the CKD EPI equation.     This calculation has not been     validated in all clinical     situations.     eGFR's persistently     <90 mL/min signify     possible Chronic Kidney Disease.  HEENT: normal and poor dentition Cardio: RRR and no murmur Resp: CTA B/L and unlabored GI: BS positive and not distended Extremity:  No Edema Skin:   Intact Neuro: Alert/Oriented, Flat, Cranial Nerve II-XII normal, Abnormal Motor 2-/5 Left Delt,bi,tri,grip, 4-/5 Left HF,KE,Ankle DF/PF, Abnormal FMC Ataxic/ dec FMC, Inattention and Other Mild left visual neglect Musc/Skel:  Other R knee pain over patellar with resisted extension Gen NAD   Assessment/Plan: 1. Functional deficits secondary to Embolic R MCA infarct which require 3+ hours per day of interdisciplinary therapy in a comprehensive inpatient rehab setting. Physiatrist is providing close team supervision  and 24 hour management of active medical problems listed below. Physiatrist and rehab team continue to assess barriers to discharge/monitor patient progress toward functional and medical goals. FIM: FIM - Bathing Bathing Steps Patient Completed: Chest;Right Arm;Left Arm;Abdomen;Front perineal area;Buttocks;Right upper leg;Left upper leg;Right lower leg (including foot);Left lower leg (including foot) Bathing: 4: Steadying assist  FIM - Upper Body Dressing/Undressing Upper body dressing/undressing steps patient completed: Thread/unthread right bra strap;Thread/unthread left bra strap;Thread/unthread right sleeve of front closure shirt/dress;Thread/unthread left sleeve of front closure shirt/dress Upper body dressing/undressing: 3: Mod-Patient completed 50-74% of tasks FIM - Lower Body Dressing/Undressing Lower body dressing/undressing steps patient completed: Thread/unthread right underwear leg;Pull underwear up/down;Thread/unthread right pants leg;Pull pants up/down;Don/Doff right shoe;Don/Doff left shoe Lower body dressing/undressing: 4: Min-Patient completed 75 plus % of tasks  FIM - Toileting Toileting steps completed by patient: Adjust clothing prior to toileting;Performs perineal hygiene;Adjust clothing after toileting Toileting Assistive Devices: Grab bar or rail for support Toileting: 4: Steadying assist  FIM - Diplomatic Services operational officer Devices: Elevated toilet seat;Grab bars;Walker Toilet Transfers: 4-To toilet/BSC: Min A (steadying Pt. > 75%);4-From toilet/BSC: Min A (steadying Pt. > 75%)  FIM - Banker Devices: Walker;Arm rests Bed/Chair Transfer: 5: Supine > Sit: Supervision (verbal cues/safety issues);4: Bed > Chair or W/C: Min A (steadying Pt. > 75%)  FIM - Locomotion: Wheelchair Distance: 150 Locomotion: Wheelchair: 1: Total Assistance/staff pushes wheelchair (Pt<25%) FIM - Locomotion: Ambulation Locomotion:  Ambulation Assistive Devices: Designer, industrial/product Ambulation/Gait Assistance: 4: Min guard Locomotion: Ambulation: 2: Travels 50 - 149 ft with minimal assistance (Pt.>75%)  Comprehension Comprehension Mode: Auditory Comprehension: 6-Follows complex conversation/direction: With extra time/assistive device  Expression Expression Mode: Verbal Expression: 6-Expresses complex ideas: With extra time/assistive device  Social Interaction Social Interaction: 7-Interacts appropriately with others - No medications needed.  Problem Solving Problem Solving: 6-Solves complex problems: With extra time  Memory Memory: 6-More than reasonable amt of time  Medical Problem List and Plan:  1. Suspected embolic Right MCA infarct  2. DVT Prophylaxis/Anticoagulation: Subcutaneous heparin discontinued today after Pradaxa initiated. Monitor for any signs of bleeding. Minimize fall risks  3. Neuropsych: This patient is capable of making decisions on his/her own behalf.  4. Hypertension/atrial flutter. Avapro 300 mg daily, hydrochlorothiazide 12.5 mg daily. Closely monitor bp/hr with increased mobility.Pradaxa initiated for atrial flutter as well as stroke prophylaxis.  -adjust regimen as needed  5. Hyperlipidemia. Zocor  6. Glaucoma. Continue eye drops as advised 7.  Chronic GERD will start PPI q am LOS (Days) 3 A FACE TO FACE EVALUATION WAS PERFORMED  KIRSTEINS,ANDREW E 04/28/2012, 8:48 AM

## 2012-04-28 NOTE — Progress Notes (Signed)
Occupational Therapy Session Note  Patient Details  Name: Sandra Larson MRN: [id number] Date of Birth: Oct 21, 1913  Today's Date: 04/28/2012 Time: 5784-6962 and 9528-4132 Time Calculation (min): 60 min and 47 min  Short Term Goals: Week 1:  OT Short Term Goal 1 (Week 1): STG = LTGs due to short LOS  Skilled Therapeutic Interventions/Progress Updates:    1) Pt seen for ADL retraining with focus on functional mobility with RW, transfers, sit <> stand, and LUE use during self-care tasks of bathing, dressing, and self-feeding.  Upon arrival, pt reports need to toilet - ambulated to toilet with RW with close supervision and cues for safety with uneven floor in bathroom.  Engaged in bathing at sink at sit <> stand level with use of step stool to aid in reaching bilateral feet.  Pt with increased functional use of LUE with bathing and dressing, requiring assistance to start zipper on jacket.  Close supervision with all mobility this session.  Use of LUE as gross assist with opening containers and toothpaste cap this session with increased time.    2) Pt seen for 1:1 OT with focus on NM re-ed and standing balance with functional use of LUE.  Engaged in shoulder flexion/extension, supination/prontation, and grasp and release in sitting and standing with focus on sustained grasp with crossing midline and manipulation of pegs and ring arc.  Pt with increased grasp of various items this session, continuing to require occasional cue for thumb movements to increase success with grasp.  Continually increasing demand on LUE with reaching to promote shoulder flexion to and greater than 90 degrees.  Pt reports difficulty with sit to stand PTA, reporting it would take a couple tries before able to stand up from chair.  Sit to stand supervision this session with increased time and multiple attempts.  Therapy Documentation Precautions:  Precautions Precautions: Fall;Shoulder;Knee Type of Shoulder Precautions:  Minor subluxation L shoulder; keep supported Precaution Comments: R knee pain/OA Required Braces or Orthoses: Other Brace/Splint Other Brace/Splint: Lt wrist Restrictions Weight Bearing Restrictions: No General:   Vital Signs: Therapy Vitals Temp: 97.9 F (36.6 C) Temp src: Oral Pulse Rate: 91 Resp: 18 BP: 91/63 mmHg Patient Position, if appropriate: Sitting Oxygen Therapy SpO2: 94 % O2 Device: None (Room air) Pain: Pain Assessment Pain Assessment: No/denies pain  See FIM for current functional status  Therapy/Group: Individual Therapy  Leonette Monarch 04/28/2012, 8:33 AM

## 2012-04-28 NOTE — Progress Notes (Signed)
Physical Therapy Note  Patient Details  Name: KAMARAH BILOTTA MRN: [id number] Date of Birth: 09-10-1913 Today's Date: 04/28/2012  3:00 - 3:30 30 minutes Individual session Patient denies pain. Patient did report some knee discomfort during ambulation. No level given.  Treatment session focused on ambulation and functional mobility in apartment. Patient sit to stand with min assist and ambulated 80 feet with rolling walker on tile transitioning to carpet with min steady assist. Patient sit to stand from sofa with min steady assist. Patient performed sit to and from supine and rolling to right and left on regular double bed with supervision. Patient sit to stand from bed with supervision. Patient ambulated around and over obstacles with rolling walker and min assist for balance. Patient ambulated with rolling walker 160 feet back to room with min steady assist. Patient left in wheelchair with all items in reach.   Arelia Longest M 04/28/2012, 3:35 PM

## 2012-04-28 NOTE — Plan of Care (Signed)
Problem: RH BOWEL ELIMINATION Goal: RH STG MANAGE BOWEL WITH ASSISTANCE STG Manage Bowel with mod independence  Outcome: Not Progressing Started on [place]HS

## 2012-04-28 NOTE — Progress Notes (Signed)
Physical Therapy Session Note  Patient Details  Name: Sandra Larson MRN: [id number] Date of Birth: 12-09-1913  Today's Date: 04/28/2012 Time: 0904-1006 Time Calculation (min): 62 min  Short Term Goals: Week 1:  PT Short Term Goal 1 (Week 1): = LTG of mod I overall  Skilled Therapeutic Interventions/Progress Updates:   Patient reporting pain in R knee, 8/10 but is premedicated and is using Voltaren gel; may look into knee brace to add increase support to knee. Performed sit > stand from w/c with mod-max A with manual facilitation for full anterior translation of COG over BOS during LE extension.  Reviewed results of [employer] balance training yesterday and impaired balance reactions.  Performed multiple sit <> stands from mat and anterior/posterior leans from wall with >> without UE support on RW for ankle and hip strategy training with intermittent rest breaks secondary to knee pain.  Patient also reporting discomfort mid thoracic area medial to L scapula.  Patient tender to touch in rhomboid muscle; performed anterior chest stretches for increased ER and scapular retraction with towel roll along spine; also performed active scapular retractions bilaterally x 8-10 reps with manual facilitation at L shoulder for positioning in fossa.  Performed gait training x 60' with RW in controlled environment (without wrist splint to allow for full grip LUE) with focus on full step, stride length during forwards, retro and R and L lateral stepping with RW and min A; patient with increased difficulty with R lateral stepping and R weight shifting secondary to knee pain.  Returned to sitting in w/c and returned to room; LUE placed on half lap tray for increased support to minimize back/shoulder pain.   Therapy Documentation Precautions:  Precautions Precautions: Fall;Shoulder;Knee Type of Shoulder Precautions: Minor subluxation L shoulder; keep supported Precaution Comments: R knee pain/OA Required Braces or  Orthoses: Other Brace/Splint Other Brace/Splint: Lt wrist Restrictions Weight Bearing Restrictions: No Vital Signs: Therapy Vitals Temp: 97.9 F (36.6 C) Temp src: Oral Pulse Rate: 91 Resp: 18 BP: 91/63 mmHg Patient Position, if appropriate: Sitting Oxygen Therapy SpO2: 94 % O2 Device: None (Room air) Pain: Pain Assessment Pain Assessment: 0-10 Pain Score:   8 Pain Type: Chronic pain Pain Location: Knee Pain Orientation: Right Pain Descriptors: Aching;Sore Pain Onset: On-going Pain Intervention(s): Other (Comment) (premedicated) Locomotion : Ambulation Ambulation/Gait Assistance: 4: Min assist   See FIM for current functional status  Therapy/Group: Individual Therapy  Edman Circle [facility] 04/28/2012, 10:21 AM

## 2012-04-29 ENCOUNTER — Inpatient Hospital Stay (HOSPITAL_COMMUNITY): Payer: Medicare Other | Admitting: Physical Therapy

## 2012-04-29 ENCOUNTER — Inpatient Hospital Stay (HOSPITAL_COMMUNITY): Payer: Medicare Other | Admitting: Occupational Therapy

## 2012-04-29 DIAGNOSIS — I4891 Unspecified atrial fibrillation: Secondary | ICD-10-CM

## 2012-04-29 DIAGNOSIS — I6340 Cerebral infarction due to embolism of unspecified cerebral artery: Secondary | ICD-10-CM

## 2012-04-29 NOTE — Progress Notes (Addendum)
Physical Therapy Session Note  Patient Details  Name: Sandra Larson MRN: [id number] Date of Birth: 03-22-1913  Today's Date: 04/29/2012 Time: 0730-0830 Time Calculation (min): 60 min  Short Term Goals: Week 1:  PT Short Term Goal 1 (Week 1): = LTG of mod I overall  Therapy Documentation Precautions:  Precautions: Fall;Shoulder;Knee Type of Shoulder Precautions: Minor subluxation L shoulder; keep supported Precaution Comments: R knee pain/OA Required Braces or Orthoses: Other Brace/Splint Other Brace/Splint: Lt wrist Restrictions Weight Bearing Restrictions: No Pain: note R knee pain during ambulation but not quantified  Therapeutic Activity:(30') Transfer training sit<->stand using RW with S/Mod-I (multiple times), toilet transfers from w/c using grab bar with S/Mod-I, toileting with min-A for pulling PJ pants down/up.  Therapeutic Exercise:(15') Nu-Step x 15' with 3 rest breaks Gait Training:(15') using RW with S/Mod-I x 20' from toilet/bathroom to bedside chair. 1 x 80' using RW with S/Mod-I. Up/down 2 steps using handrail and CGA/min-A.   Therapy/Group: Individual Therapy  Rex Kras 04/29/2012, 7:44 AM

## 2012-04-29 NOTE — Progress Notes (Signed)
Occupational Therapy Session Note  Patient Details  Name: Sandra Larson MRN: [id number] Date of Birth: Nov 15, 1913  Today's Date: 04/29/2012 Time: 10:30-11:30 Total Time Calculation=60 minutes  Skilled Therapeutic Interventions/Progress Updates: ADL in w/c at sink with focus on bath/dressing LB and dynamic standing balance and initiation of use of L UE.  Patient initiated using L UE with mild difficulty.   At times used stood to address feet for b/d as needed due to fatigue.    Therapy Documentation Precautions:  Precautions Precautions: Fall;Shoulder;Knee Type of Shoulder Precautions: Minor subluxation L shoulder; keep supported Precaution Comments: R knee pain/OA Required Braces or Orthoses: Other Brace/Splint Other Brace/Splint: Lt wrist Restrictions Weight Bearing Restrictions: No Therapy Vitals Temp: 98.4 F (36.9 C) Temp src: Oral Pulse Rate: 78 Resp: 18 BP: 121/71 mmHg Patient Position, if appropriate: Sitting Pain:denied    See FIM for current functional status  Therapy/Group: Individual Therapy  Bud Face [facility] 04/29/2012, 3:50 PM

## 2012-04-29 NOTE — Progress Notes (Signed)
Physical Therapy Session Note  Patient Details  Name: Sandra Larson MRN: [id number] Date of Birth: [birth year]  Today's Date: 04/29/2012 Time: 5784-6962 Time Calculation (min): 45 min  Short Term Goals: Week 1:  PT Short Term Goal 1 (Week 1): = LTG of mod I overall   Therapy Documentation Precautions:  Precautions Precautions: Fall;Shoulder;Knee Type of Shoulder Precautions: Minor subluxation L shoulder; keep supported Precaution Comments: R knee pain/OA Required Braces or Orthoses: Other Brace/Splint Other Brace/Splint: Lt wrist Restrictions Weight Bearing Restrictions: No Pain: denies pain at rest except for Right lateral knee pain with walking and unrated  Gait Training:(15') using RW 1 x 100' with patient noting R lateral knee pain while ambulating, 1 x 30' S/Mod-I Therapeutic Exercise:(15') B LE's in sitting and in supine including bridging Therapeutic Activity:(15') Transfer training sit<->stand into RW with S/Independent, w/c<->bed S/Mod-I    Therapy/Group: Individual Therapy  Candee Hoon J 04/29/2012, 1:08 PM

## 2012-04-29 NOTE — Progress Notes (Signed)
Occupational Therapy Session Note  Patient Details  Name: Sandra Larson MRN: [id number] Date of Birth: 27-Jul-1913  Today's Date: 04/29/2012 Time: 1400-1431 Time Calculation (min): 31 min   Skilled Therapeutic Interventions/Progress Updates:seated endurance and therapeutic activities, including using L UE to assist with bilateral and to complete tasks.  Also granddtr and great grandson were present, and [name] wanted to hold the 67 month old baby.   This clinician sat very close to on one side and the grand dtr on the patient's other side of patient while she held her grandson for about 6 minutes.        Therapy Documentation Precautions:  Precautions Precautions: Fall;Shoulder;Knee Type of Shoulder Precautions: Minor subluxation L shoulder; keep supported Precaution Comments: R knee pain/OA Required Braces or Orthoses: Other Brace/Splint Other Brace/Splint: Lt wrist Restrictions Weight Bearing Restrictions: No  Pain:denied   See FIM for current functional status  Therapy/Group: Individual Therapy  Bud Face [facility] 04/29/2012, 4:42 PM

## 2012-04-29 NOTE — Progress Notes (Signed)
Patient ID: Sandra Larson, female   DOB: 16-May-1913, 79 y.o.   MRN: [id number] Subjective/Complaints: 79 y.o. right hand female with history of hypertension who lives alone in [place]. She uses a rolling walker in her home for ambulation. Admitted 04/21/2012 with progressive left-sided weakness over a seven-day period. MRI of the brain showed multifocal areas of acute cerebral infarct. These lie within the right MCA territory. 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 Patient did not receive TPA. Echocardiogram with ejection fraction of 65-70% without emboli. Carotid Dopplers with right 40-59% ICA stenosis. TEE showed no thrombus or vegetation.. Neurology services consulted maintained on Plavix therapy for stroke prophylaxis as well as subcutaneous heparin for DVT prophylaxis. Question atrial flutter 04/25/2012 at 5:35 AM EKG strip was not available. Contact to neurology services as well as consult to cardiology Dr. Jacinto Halim and advised to change Plavix to her Pradaxa. Her subcutaneous heparin was discontinued once Pradaxa was initiated  No specific complaints this morning. Review of Systems  Respiratory: Negative for shortness of breath.   Cardiovascular: Negative for chest pain.  Gastrointestinal: Negative for heartburn, abdominal pain and diarrhea.   Objective: Vital Signs: Blood pressure 133/65, pulse 78, temperature 98.1 F (36.7 C), temperature source Oral, resp. rate 18, height 5' 4.96" (1.65 m), weight 127 lb 13.9 oz (58 kg), SpO2 94.00%.  Examination: Elderly female in no acute distress. She appears much are than her stated age. HEENT exam: Atraumatic, normocephalic. Neck is supple without jugular venous distention. Chest is clear to auscultation. Cardiac exam S1-S2 are regular. Abdominal exam active bowel sounds, soft. Extremities no edema. Neurologic exam she is alert she is oriented.   Assessment/Plan: 1. Functional  deficits secondary to Embolic R MCA infarct  Medical Problem List and Plan:  1. Suspected embolic Right MCA infarct  2. DVT Prophylaxis/Anticoagulation: Subcutaneous heparin discontinued today after Pradaxa initiated. Monitor for any signs of bleeding. Minimize fall risks  3. Neuropsych: This patient is capable of making decisions on his/her own behalf.  4. Hypertension/atrial flutter. Avapro 300 mg daily, hydrochlorothiazide 12.5 mg daily. Closely monitor bp/hr with increased mobility.Pradaxa initiated for atrial flutter as well as stroke prophylaxis.  -adjust regimen as needed  5. Hyperlipidemia. Zocor  6. Glaucoma. Continue eye drops as advised 7.  Chronic GERD will start PPI q am LOS (Days) 4 A FACE TO FACE EVALUATION WAS PERFORMED  Jabarie Pop HENRY 04/29/2012, 7:58 AM

## 2012-04-30 ENCOUNTER — Inpatient Hospital Stay (HOSPITAL_COMMUNITY): Payer: Medicare Other | Admitting: Physical Therapy

## 2012-04-30 NOTE — Progress Notes (Signed)
Patient ID: Sandra Larson, female   DOB: August 06, 1913, 79 y.o.   MRN: [id number] Subjective/Complaints: 79 y.o. right hand female with history of hypertension who lives alone in [place]. She uses a rolling walker in her home for ambulation. Admitted 04/21/2012 with progressive left-sided weakness over a seven-day period. MRI of the brain showed multifocal areas of acute cerebral infarct. These lie within the right MCA territory. 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 Patient did not receive TPA. Echocardiogram with ejection fraction of 65-70% without emboli. Carotid Dopplers with right 40-59% ICA stenosis. TEE showed no thrombus or vegetation.. Neurology services consulted maintained on Plavix therapy for stroke prophylaxis as well as subcutaneous heparin for DVT prophylaxis. Question atrial flutter 04/25/2012 at 5:35 AM EKG strip was not available. Contact to neurology services as well as consult to cardiology Dr. Jacinto Halim and advised to change Plavix to her Pradaxa. Her subcutaneous heparin was discontinued once Pradaxa was initiated  No complaints this morning. Reviewed physical therapy, occupational therapy notes from yesterday.  Review of Systems  Respiratory: Negative for shortness of breath.   Cardiovascular: Negative for chest pain.  Gastrointestinal: Negative for heartburn, abdominal pain and diarrhea.   Objective: Vital Signs: Blood pressure 108/70, pulse 73, temperature 97.3 F (36.3 C), temperature source Oral, resp. rate 17, height 5' 4.96" (1.65 m), weight 127 lb 13.9 oz (58 kg), SpO2 97.00%.  Examination: Elderly female in no acute distress. She appears much are than her stated age. HEENT exam: Atraumatic, normocephalic. Neck is supple without jugular venous distention. Chest is clear to auscultation. Cardiac exam S1-S2 are regular. Abdominal exam active bowel sounds, soft. Extremities no edema. Neurologic exam she  is alert she is oriented.   Assessment/Plan: 1. Functional deficits secondary to Embolic R MCA infarct  Medical Problem List and Plan:  1. Suspected embolic Right MCA infarct  2. DVT Prophylaxis/Anticoagulation: Currently on heparin and on Pradaxa.  3. Neuropsych: This patient is capable of making decisions on his/her own behalf.  4. Hypertension/atrial flutter. Currently on Avapro 300 mg daily, she is no longer on hydrochlorothiazide 12.5 mg daily.  Closely monitor bp/hr with increased mobility.Pradaxa initiated for atrial flutter as well as stroke prophylaxis.  -adjust regimen as needed  5. Hyperlipidemia. Zocor  6. Glaucoma. Continue eye drops as advised 7.  Chronic GERD will start PPI q am LOS (Days) 5 A FACE TO FACE EVALUATION WAS PERFORMED  Nakaya Mishkin HENRY 04/30/2012, 7:06 AM

## 2012-04-30 NOTE — Progress Notes (Signed)
Physical Therapy Note  Patient Details  Name: Sandra Larson MRN: [id number] Date of Birth: 01-Jul-1913 Today's Date: 04/30/2012  1030-1110 (40 minutes) individual Pain: no reported pain Focus of treatment: gait training; standing balance Treatment: gait RW 80 feet x 2 SBA (moved walker height up one setting) ; standing balance - standing alternating LEs on 4 inch step with 1 minute hold min assist for balance; standing performing arm rainbow using left hand; sit to stand from mat or wc SBA .     Arvis Zwahlen,JIM 04/30/2012, 10:40 AM

## 2012-05-01 ENCOUNTER — Inpatient Hospital Stay (HOSPITAL_COMMUNITY): Payer: Medicare Other | Admitting: Occupational Therapy

## 2012-05-01 ENCOUNTER — Inpatient Hospital Stay (HOSPITAL_COMMUNITY): Payer: Medicare Other | Admitting: Physical Therapy

## 2012-05-01 LAB — BASIC METABOLIC PANEL
BUN: 36 mg/dL — ABNORMAL HIGH (ref 6–23)
Chloride: 98 mEq/L (ref 96–112)
GFR calc Af Amer: 36 mL/min — ABNORMAL LOW (ref >90)
Potassium: 3.9 mEq/L (ref 3.5–5.1)

## 2012-05-01 NOTE — Progress Notes (Signed)
Occupational Therapy Session Note  Patient Details  Name: Sandra Larson MRN: [id number] Date of Birth: 06/16/1913  Today's Date: 05/01/2012 Time: 1191-4782 and 9562-1308 Time Calculation (min): 60 min and 52 min  Short Term Goals: Week 1:  OT Short Term Goal 1 (Week 1): STG = LTGs due to short LOS  Skilled Therapeutic Interventions/Progress Updates:    1) Pt seen for ADL retraining with focus on functional mobility with RW, transfers, sit <> stand, and functional use of LUE with self-care tasks.  Pt tolerated entire session without Lt wrist splint donned with no c/o pain and noted improved grasp.  Pt with increased functional use of LUE with grasping, wringing out wash cloth, washing, and pulling up pants.  Pt hooked bra in front this session with assist to start first hook.  Pt attempted buttoning shirt this session without success, reminded pt it may be better if she keeps her shirts buttoned and pull it over her head when dressing to decrease that burden.  Also discussed button hook as a possibility.  Pt supervision with all mobility this session.  2) Pt seen for 1:1 OT with focus on functional mobility and balance in ADL kitchen with focus on higher level ADLs.  Pt's daughter present and reports concern with household tasks such as cooking.  Engaged in kitchen activity with obtaining items from high and low cabinets, transferring pot from sink to stove top along counter while ambulating with RW, obtaining items from oven, and transferring items from counter to [place] behind her.  Pt with no LOB and able to recall safety education from earlier in session.  Educated pt on adaptive cutting board and purchasing items that may already be precut or pre sliced to assist with meal preparation.  NM re-ed with focus on grasp, pinch, and supination/prontation.  Pt with increased sustained grasp with reaching across midline and increased pinch strength with resistive clothespins  Upon returning to room,  pt reported need to toilet, ambulated to bathroom with RW with supervision and completed toileting with distant supervision.  Therapy Documentation Precautions:  Precautions Precautions: Fall;Shoulder;Knee Type of Shoulder Precautions: Minor subluxation L shoulder; keep supported Precaution Comments: R knee pain/OA Required Braces or Orthoses: Other Brace/Splint Other Brace/Splint: Lt wrist Restrictions Weight Bearing Restrictions: No General:   Vital Signs: Therapy Vitals Temp: 98.3 F (36.8 C) Temp src: Oral Pulse Rate: 93 Resp: 18 BP: 101/47 mmHg Patient Position, if appropriate: Standing Oxygen Therapy SpO2: 97 % O2 Device: None (Room air) Pain:  Pt with no c/o pain this session.  See FIM for current functional status  Therapy/Group: Individual Therapy  Leonette Monarch 05/01/2012, 8:42 AM

## 2012-05-01 NOTE — Progress Notes (Signed)
Physical Therapy Session Note  Patient Details  Name: Sandra Larson MRN: [id number] Date of Birth: 1913/02/11  Today's Date: 05/01/2012 Time: 4098-1191 and 4782-9562 Time Calculation (min): 73 min and 26 min  Short Term Goals: Week 1:  PT Short Term Goal 1 (Week 1): = LTG of mod I overall  Therapy Documentation Precautions:  Precautions Precautions: Fall;Shoulder;Knee Type of Shoulder Precautions: Minor subluxation L shoulder; keep supported Precaution Comments: R knee pain/OA Required Braces or Orthoses: Other Brace/Splint Other Brace/Splint: Lt wrist Restrictions Weight Bearing Restrictions: No General:  Patient reporting improved pain in R knee with Voltaren gel; more tolerable with activity. Patient still reporting pain in posterior LUE in region of latissimus and tricep at rest in dependent position, during shoulder flexion past 90, during ER and ABD below 90.  May benefit from gentle stretching, AAROM in supported supine position for increased ROM and decreased muscle strain/pain.  Patient's daughter present and asking about patient's progress and ability to return home at [facility].  Discussed with patient and daughter's barriers to patient independence at home: bathing: patient reports standing and bathing at sink PTA and does not like to use shower secondary to dizziness when water hits her and having to stand.  Discussed option of tub bench in shower but patient feels that she would still prefer to take bath at sink.  Discussed trying bath at sink with OT in standing to see if patient able to tolerate standing for full bath or would need seat in bathroom for intermittent sitting rest breaks during bath.  Will discuss with OT.  Also discussed meal preparation with patient and daughter.  Patient reports fixing cereal, coffee in am and potatoes or green beans for lunch.  Will begin to incorporate kitchen activities into treatment sessions.  Also discussed with patient and daughter  patient's endurance during community outings and need for rental w/c for longer distances.  Patient reports that she has a small transport chair she can use for longer outings.  Pt may benefit from community outing with therapy and recreation therapy prior to D/C.   Vital Signs: Therapy Vitals Pulse Rate: 93 BP: 101/47 mmHg Patient Position, if appropriate: Standing Pain: Pain Assessment Pain Assessment: 0-10 Pain Score:   2 Pain Type: Acute pain Pain Location: Shoulder Pain Orientation: Left Pain Descriptors: Sore Pain Onset: Gradual Pain Intervention(s): Repositioned;Elevated extremity;Therapeutic touch Mobility:  Patient performing sit <> stand and stand pivot transfers from w/c or mat with supervision with improved ability to translate COG anterior over BOS during LE extension.  Discussed transportation options upon D/C; pt reports that her daughter and granddaughter have small size SUV.  Patient verbalized and demonstrated safe sequence for entering and exiting car with stand pivot with RW mod I.   Locomotion : Ambulation Ambulation/Gait Assistance: 5: Supervision Performed stair negotiation training up and down 4 stairs 6.5 inches tall x 2 reps with bilat UE support on rails and supervision-min A for stability and verbal cues for safe sequence ascending with LLE and descending with RLE secondary to knee pain. Patient reporting that in community she will often have to negotiate one step/curb.  Practiced up and down one step/curb with RW and min A x 4 reps with verbal and visual cues for sequence and safety. Other Treatments: Treatments Neuromuscular Facilitation: Left;Upper Extremity;Activity to increase motor control;Activity to increase timing and sequencing;Activity to increase sustained activation;Activity to increase lateral weight shifting;Activity to increase anterior-posterior weight shifting Weight Bearing Technique Weight Bearing Technique: Yes LUE Weight  Bearing Technique:  Forearm seated on mat with focus on WB on L elbow for WB through shoulder for activation of rotator cuff muscles during reaching with RUE in various directions out of BOS for anterior and lateral leaning, weight shifting and rotation and increased activation of L shoulder and intermittent pushing up through LUE for tricep strengthening.  Tolerated well with minimal c/o pain.  PM session:   Patient performed kitchen tasks with OT during am session.  Discussed with patient other household chores she completes.  Reports that she does fold her laundry.  Performed functional standing balance and endurance activity at tall table while folding towels for functional use and coordination of LUE.  Patient required one sitting rest break secondary to pain in LUE.  Patient continues to refuse pain medication for LUE.  Patient required intermittent verbal cues to attend to LUE positioning secondary to numbness and poor position sense. Returned to room and patient performed ambulation in room from w/c > toilet with RW and supervision-min A.  Patient to attempt to have BM; to call nursing staff when ready to transfer off toilet.    See FIM for current functional status  Therapy/Group: Individual Therapy  Edman Circle [facility] 05/01/2012, 10:19 AM

## 2012-05-01 NOTE — Progress Notes (Signed)
Patient ID: Sandra Larson, female   DOB: 01/12/1914, 79 y.o.   MRN: [id number] Subjective/Complaints: 79 y.o. right hand female with history of hypertension who lives alone in [place]. She uses a rolling walker in her home for ambulation. Admitted 04/21/2012 with progressive left-sided weakness over a seven-day period. MRI of the brain showed multifocal areas of acute cerebral infarct. These lie within the right MCA territory. 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 Patient did not receive TPA. Echocardiogram with ejection fraction of 65-70% without emboli. Carotid Dopplers with right 40-59% ICA stenosis. TEE showed no thrombus or vegetation.. Neurology services consulted maintained on Plavix therapy for stroke prophylaxis as well as subcutaneous heparin for DVT prophylaxis. Question atrial flutter 04/25/2012 at 5:35 AM EKG strip was not available. Contact to neurology services as well as consult to cardiology Dr. Jacinto Halim and advised to change Plavix to her Pradaxa. Her subcutaneous heparin was discontinued once Pradaxa was initiated  Right knee pain, remote patellar injury, some relief with voltaren gel. , last recorded BM 4/13,continent of bowel No left hand pain but using Wrist splint Review of Systems  Respiratory: Negative for shortness of breath.   Cardiovascular: Negative for chest pain.  Gastrointestinal: Negative for heartburn, abdominal pain and diarrhea.  All other systems reviewed and are negative.   Objective: Vital Signs: Blood pressure 101/47, pulse 93, temperature 98.3 F (36.8 C), temperature source Oral, resp. rate 18, height 5' 4.96" (1.65 m), weight 58 kg (127 lb 13.9 oz), SpO2 97.00%. No results found. Results for orders placed during the hospital encounter of 04/25/12 (from the past 72 hour(s))  BASIC METABOLIC PANEL     Status: Abnormal   Collection Time    05/01/12  6:16 AM      Result Value Range    Sodium 134 (*) 135 - 145 mEq/L   Potassium 3.9  3.5 - 5.1 mEq/L   Chloride 98  96 - 112 mEq/L   CO2 26  19 - 32 mEq/L   Glucose, Bld 95  70 - 99 mg/dL   BUN 36 (*) 6 - 23 mg/dL   Creatinine, Ser 7.82 (*) 0.50 - 1.10 mg/dL   Calcium 9.1  8.4 - 95.6 mg/dL   GFR calc non Af Amer 31 (*) >90 mL/min   GFR calc Af Amer 36 (*) >90 mL/min   Comment:            The eGFR has been calculated     using the CKD EPI equation.     This calculation has not been     validated in all clinical     situations.     eGFR's persistently     <90 mL/min signify     possible Chronic Kidney Disease.     HEENT: normal and poor dentition Cardio: RRR and no murmur Resp: CTA B/L and unlabored GI: BS positive and not distended Extremity:  No Edema Skin:   Intact Neuro: Alert/Oriented, Flat, Cranial Nerve II-XII normal, Abnormal Motor 2-/5 Left Delt,bi,tri,grip,wrist ext, 4-/5 Left HF,KE,Ankle DF/PF, Abnormal FMC Ataxic/ dec FMC, Inattention and Other Mild left visual neglect Musc/Skel:  Other R knee pain over patellar with resisted extension, Left wrist non tender no erythema or swelling,no finger pain with ROM Gen NAD   Assessment/Plan: 1. Functional deficits secondary to Embolic R MCA infarct which require 3+ hours per day of interdisciplinary therapy in a comprehensive inpatient rehab setting. Physiatrist is providing  close team supervision and 24 hour management of active medical problems listed below. Physiatrist and rehab team continue to assess barriers to discharge/monitor patient progress toward functional and medical goals. FIM: FIM - Bathing Bathing Steps Patient Completed: Chest;Right Arm;Left Arm;Abdomen;Front perineal area;Buttocks;Right upper leg;Left upper leg;Right lower leg (including foot);Left lower leg (including foot) Bathing: 5: Supervision: Safety issues/verbal cues  FIM - Upper Body Dressing/Undressing Upper body dressing/undressing steps patient completed: Thread/unthread right  bra strap;Thread/unthread left bra strap;Hook/unhook bra;Thread/unthread right sleeve of front closure shirt/dress;Thread/unthread left sleeve of front closure shirt/dress;Pull shirt around back of front closure shirt/dress Upper body dressing/undressing: 4: Min-Patient completed 75 plus % of tasks FIM - Lower Body Dressing/Undressing Lower body dressing/undressing steps patient completed: Thread/unthread right underwear leg;Thread/unthread left underwear leg;Pull underwear up/down;Thread/unthread right pants leg;Thread/unthread left pants leg;Pull pants up/down;Don/Doff right shoe;Don/Doff left shoe;Fasten/unfasten right shoe;Fasten/unfasten left shoe Lower body dressing/undressing: 5: Set-up assist to: Don/Doff TED stocking  FIM - Toileting Toileting steps completed by patient: Adjust clothing prior to toileting;Performs perineal hygiene;Adjust clothing after toileting Toileting Assistive Devices: Grab bar or rail for support Toileting: 5: Supervision: Safety issues/verbal cues  FIM - Diplomatic Services operational officer Devices: Elevated toilet seat;Grab bars;Walker Toilet Transfers: 5-To toilet/BSC: Supervision (verbal cues/safety issues);5-From toilet/BSC: Supervision (verbal cues/safety issues)  FIM - Banker Devices: Therapist, occupational: 4: Bed > Chair or W/C: Min A (steadying Pt. > 75%)  FIM - Locomotion: Wheelchair Distance: 150 Locomotion: Wheelchair: 1: Total Assistance/staff pushes wheelchair (Pt<25%) FIM - Locomotion: Ambulation Locomotion: Ambulation Assistive Devices: Designer, industrial/product Ambulation/Gait Assistance: 4: Min assist Locomotion: Ambulation: 4: Travels 150 ft or more with minimal assistance (Pt.>75%)  Comprehension Comprehension Mode: Auditory Comprehension: 6-Follows complex conversation/direction: With extra time/assistive device  Expression Expression Mode: Verbal Expression: 6-Expresses complex ideas:  With extra time/assistive device  Social Interaction Social Interaction: 7-Interacts appropriately with others - No medications needed.  Problem Solving Problem Solving: 6-Solves complex problems: With extra time  Memory Memory: 6-More than reasonable amt of time  Medical Problem List and Plan:  1. Suspected embolic Right MCA infarct  2. DVT Prophylaxis/Anticoagulation: Subcutaneous heparin discontinued today after Pradaxa initiated. Monitor for any signs of bleeding. Minimize fall risks  3. Neuropsych: This patient is capable of making decisions on his/her own behalf.  4. Hypertension/atrial flutter. Avapro 300 mg daily, hydrochlorothiazide 12.5 mg daily. Closely monitor bp/hr with increased mobility.Pradaxa initiated for atrial flutter as well as stroke prophylaxis.  -adjust regimen as needed  5. Hyperlipidemia. Zocor  6. Glaucoma. Continue eye drops as advised 7.  Chronic GERD will start PPI q am LOS (Days) 6 A FACE TO FACE EVALUATION WAS PERFORMED  KIRSTEINS,ANDREW E 05/01/2012, 8:44 AM

## 2012-05-02 ENCOUNTER — Inpatient Hospital Stay (HOSPITAL_COMMUNITY): Payer: Medicare Other

## 2012-05-02 ENCOUNTER — Inpatient Hospital Stay (HOSPITAL_COMMUNITY): Payer: Medicare Other | Admitting: Occupational Therapy

## 2012-05-02 ENCOUNTER — Inpatient Hospital Stay (HOSPITAL_COMMUNITY): Payer: Medicare Other | Admitting: Physical Therapy

## 2012-05-02 DIAGNOSIS — I6340 Cerebral infarction due to embolism of unspecified cerebral artery: Secondary | ICD-10-CM

## 2012-05-02 DIAGNOSIS — I4891 Unspecified atrial fibrillation: Secondary | ICD-10-CM

## 2012-05-02 NOTE — Progress Notes (Signed)
Physical Therapy Session Note  Patient Details  Name: Sandra Larson MRN: [id number] Date of Birth: 05/01/1913  Today's Date: 05/02/2012 Time: 0902-1000 Time Calculation (min): 58 min  Short Term Goals: Week 1:  PT Short Term Goal 1 (Week 1): = LTG of mod I overall  Skilled Therapeutic Interventions/Progress Updates:   Patient to go on lunch outing late morning.  Patient educated on purpose of outing.  Participated in static standing balance, endurance activity at tall table with focus on use of LUE, coordination and strengthening against gravity during checkers activity x 10 minutes overall.  Also performed bilat LE strengthening and balance training in standing with bilat UE support during OTAGO HEP: 10 reps each LE heel raises, toe raises, hip flexion marches, hip ABD, hip extension and hamstring curls with intermittent sitting rest breaks secondary to knee pain during single limb stance.  Patient reporting need to have BM.  Performed transfer w/c <> toilet with RW and performed all toileting tasks and handwashing at the sink in standing with RW and supervision with intermittent verbal cues to use LUE for functional tasks.    Therapy Documentation Precautions:  Precautions Precautions: Fall;Shoulder;Knee Type of Shoulder Precautions: Minor subluxation L shoulder; keep supported Precaution Comments: R knee pain/OA Required Braces or Orthoses: Other Brace/Splint Other Brace/Splint: Lt wrist Restrictions Weight Bearing Restrictions: No Vital Signs: Therapy Vitals BP: 105/50 mmHg Pain: Pain Assessment Pain Assessment: No/denies pain Pain Score:   5 Pain Type: Acute pain Pain Location: Shoulder Pain Orientation: Left Pain Descriptors: Sore Pain Onset: On-going Pain Intervention(s): Repositioned;RN made aware  See FIM for current functional status  Therapy/Group: Individual Therapy  Edman Circle [facility] 05/02/2012, 9:59 AM

## 2012-05-02 NOTE — Progress Notes (Signed)
Occupational Therapy Note  Patient Details  Name: Sandra Larson MRN: [id number] Date of Birth: May 30, 1913 Today's Date: 05/02/2012 Time:  1100-1245 (105 mins)  Pt seen for community integration outing with focus on functional mobility in community setting, energy conservation, and identification of barriers in community setting.  Pt completed van steps with min assist and demonstrated ability to negotiate curb and uneven surfaces in community with supervision with RW.  Problem solved carrying items with assistance and accessing public restroom with supervision.  Pt at overall supervision with mobility during outing.  Pt able to identify barriers in community and discussed with this clinician and TR multiple solutions to barriers. See community outing goal sheet for further details.   Pt with no c/o pain this session.  Leonette Monarch 05/02/2012, 2:10 PM

## 2012-05-02 NOTE — Progress Notes (Signed)
Patient ID: Sandra Larson, female   DOB: December 10, 1913, 79 y.o.   MRN: [id number] Subjective/Complaints: 79 y.o. right hand female with history of hypertension who lives alone in [place]. She uses a rolling walker in her home for ambulation. Admitted 04/21/2012 with progressive left-sided weakness over a seven-day period. MRI of the brain showed multifocal areas of acute cerebral infarct. These lie within the right MCA territory. 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 Patient did not receive TPA. Echocardiogram with ejection fraction of 65-70% without emboli. Carotid Dopplers with right 40-59% ICA stenosis. TEE showed no thrombus or vegetation.. Neurology services consulted maintained on Plavix therapy for stroke prophylaxis as well as subcutaneous heparin for DVT prophylaxis. Question atrial flutter 04/25/2012 at 5:35 AM EKG strip was not available. Contact to neurology services as well as consult to cardiology Dr. Jacinto Halim and advised to change Plavix to her Pradaxa. Her subcutaneous heparin was discontinued once Pradaxa was initiated  Last BM 4/13, no abd pain Review of Systems  Respiratory: Negative for shortness of breath.   Cardiovascular: Negative for chest pain.  Gastrointestinal: Negative for heartburn, abdominal pain and diarrhea.  All other systems reviewed and are negative.   Objective: Vital Signs: Blood pressure 105/50, pulse 71, temperature 98 F (36.7 C), temperature source Oral, resp. rate 18, height 5' 4.96" (1.65 m), weight 58 kg (127 lb 13.9 oz), SpO2 98.00%. No results found. Results for orders placed during the hospital encounter of 04/25/12 (from the past 72 hour(s))  BASIC METABOLIC PANEL     Status: Abnormal   Collection Time    05/01/12  6:16 AM      Result Value Range   Sodium 134 (*) 135 - 145 mEq/L   Potassium 3.9  3.5 - 5.1 mEq/L   Chloride 98  96 - 112 mEq/L   CO2 26  19 - 32 mEq/L    Glucose, Bld 95  70 - 99 mg/dL   BUN 36 (*) 6 - 23 mg/dL   Creatinine, Ser 4.09 (*) 0.50 - 1.10 mg/dL   Calcium 9.1  8.4 - 81.1 mg/dL   GFR calc non Af Amer 31 (*) >90 mL/min   GFR calc Af Amer 36 (*) >90 mL/min   Comment:            The eGFR has been calculated     using the CKD EPI equation.     This calculation has not been     validated in all clinical     situations.     eGFR's persistently     <90 mL/min signify     possible Chronic Kidney Disease.     HEENT: normal and poor dentition Cardio: RRR and no murmur Resp: CTA B/L and unlabored GI: BS positive and not distended Extremity:  No Edema Skin:   Intact Neuro: Alert/Oriented, Flat, Cranial Nerve II-XII normal, Abnormal Motor 2-/5 Left Delt,bi,tri,grip,wrist ext, 4-/5 Left HF,KE,Ankle DF/PF, Abnormal FMC Ataxic/ dec FMC, Inattention and Other Mild left visual neglect Musc/Skel:  Other R knee pain over patellar with resisted extension, Left wrist non tender no erythema or swelling,no finger pain with ROM Gen NAD   Assessment/Plan: 1. Functional deficits secondary to Embolic R MCA infarct which require 3+ hours per day of interdisciplinary therapy in a comprehensive inpatient rehab setting. Physiatrist is providing close team supervision and 24 hour management of active medical problems listed below. Physiatrist and rehab team continue to assess  barriers to discharge/monitor patient progress toward functional and medical goals. FIM: FIM - Bathing Bathing Steps Patient Completed: Chest;Right Arm;Left Arm;Abdomen;Front perineal area;Buttocks;Right upper leg;Left upper leg;Right lower leg (including foot);Left lower leg (including foot) Bathing: 5: Supervision: Safety issues/verbal cues  FIM - Upper Body Dressing/Undressing Upper body dressing/undressing steps patient completed: Thread/unthread right bra strap;Thread/unthread left bra strap;Hook/unhook bra;Thread/unthread right sleeve of front closure  shirt/dress;Thread/unthread left sleeve of front closure shirt/dress;Pull shirt around back of front closure shirt/dress Upper body dressing/undressing: 4: Min-Patient completed 75 plus % of tasks FIM - Lower Body Dressing/Undressing Lower body dressing/undressing steps patient completed: Thread/unthread right underwear leg;Thread/unthread left underwear leg;Pull underwear up/down;Thread/unthread right pants leg;Thread/unthread left pants leg;Pull pants up/down;Don/Doff right shoe;Don/Doff left shoe;Fasten/unfasten right shoe;Fasten/unfasten left shoe Lower body dressing/undressing: 5: Set-up assist to: Don/Doff TED stocking  FIM - Toileting Toileting steps completed by patient: Adjust clothing prior to toileting;Performs perineal hygiene;Adjust clothing after toileting Toileting Assistive Devices: Grab bar or rail for support Toileting: 5: Supervision: Safety issues/verbal cues  FIM - Diplomatic Services operational officer Devices: Elevated toilet seat;Grab bars;Walker Toilet Transfers: 5-To toilet/BSC: Supervision (verbal cues/safety issues);5-From toilet/BSC: Supervision (verbal cues/safety issues)  FIM - Banker Devices: Therapist, occupational: 5: Bed > Chair or W/C: Supervision (verbal cues/safety issues);5: Chair or W/C > Bed: Supervision (verbal cues/safety issues)  FIM - Locomotion: Wheelchair Distance: 150 Locomotion: Wheelchair: 1: Total Assistance/staff pushes wheelchair (Pt<25%) FIM - Locomotion: Ambulation Locomotion: Ambulation Assistive Devices: Designer, industrial/product Ambulation/Gait Assistance: 5: Supervision Locomotion: Ambulation: 1: Travels less than 50 ft with supervision/safety issues  Comprehension Comprehension Mode: Auditory Comprehension: 6-Follows complex conversation/direction: With extra time/assistive device  Expression Expression Mode: Verbal Expression: 6-Expresses complex ideas: With extra time/assistive  device  Social Interaction Social Interaction: 7-Interacts appropriately with others - No medications needed.  Problem Solving Problem Solving: 6-Solves complex problems: With extra time  Memory Memory: 6-More than reasonable amt of time  Medical Problem List and Plan:  1. Suspected embolic Right MCA infarct  2. DVT Prophylaxis/Anticoagulation: Subcutaneous heparin discontinued today after Pradaxa initiated. Monitor for any signs of bleeding. Minimize fall risks  3. Neuropsych: This patient is capable of making decisions on his/her own behalf.  4. Hypertension/atrial flutter. Avapro 300 mg daily, hydrochlorothiazide 12.5 mg daily. Closely monitor bp/hr with increased mobility.Pradaxa initiated for atrial flutter as well as stroke prophylaxis.  -adjust regimen as needed  5. Hyperlipidemia. Zocor  6. Glaucoma. Continue eye drops as advised 7.  Chronic GERD PPI q am LOS (Days) 7 A FACE TO FACE EVALUATION WAS PERFORMED  KIRSTEINS,ANDREW E 05/02/2012, 8:29 AM

## 2012-05-02 NOTE — Progress Notes (Signed)
Occupational Therapy Session Note  Patient Details  Name: Sandra Larson MRN: [id number] Date of Birth: 09/26/1913  Today's Date: 05/02/2012 Time: 0732-0832 Time Calculation (min): 60 min  Short Term Goals: Week 1:  OT Short Term Goal 1 (Week 1): STG = LTGs due to short LOS  Skilled Therapeutic Interventions/Progress Updates:    Pt seen for ADL retraining with focus on increased standing tolerance during self-care tasks and increased carryover of adaptive techniques to assist with dressing.  Pt reports standing to complete sponge bath PTA and propping feet on tub ledge to wash feet.  Educated pt on energy conservation strategies and possibilities for equipment to sit while washing feet.  Suggested transport chair in bathroom, sitting on commode, or use of shower chair, pt receptive to suggestions but unsure what would work best for her setup - plan to further discuss options with daughter.  Pt completed bathing in standing except seated in w/c with use of step stool to wash feet.  Pt required setup assist with gathering clothing and buttoning front closure shirt prior to donning to allow pt to don like pullover shirt for increased independence.  Pt completed grooming in standing at sink with distant supervision. Plan to have pt more engaged in gathering items prior to bathing and dressing to increase independence.  Therapy Documentation Precautions:  Precautions Precautions: Fall;Shoulder;Knee Type of Shoulder Precautions: Minor subluxation L shoulder; keep supported Precaution Comments: R knee pain/OA Required Braces or Orthoses: Other Brace/Splint Other Brace/Splint: Lt wrist Restrictions Weight Bearing Restrictions: No General:   Vital Signs: Therapy Vitals BP: 105/50 mmHg Pain: Pain Assessment Pain Assessment: No/denies pain Pain Score:   5 Pain Type: Acute pain Pain Location: Shoulder Pain Orientation: Left Pain Descriptors: Sore Pain Onset: On-going Pain  Intervention(s): Repositioned;RN made aware  See FIM for current functional status  Therapy/Group: Individual Therapy  Leonette Monarch 05/02/2012, 10:39 AM

## 2012-05-02 NOTE — Progress Notes (Signed)
Recreational Therapy Session Note  Patient Details  Name: Sandra Larson MRN: [id number] Date of Birth: [birth year] Today's Date: 05/02/2012 Time:  11-1243 Pain: no c/o Problem List:  Patient Active Problem List   Diagnosis   .  CVA (cerebral infarction)   .  HTN (hypertension)   .  Atrial fibrillation   .  Embolic infarction    Past Medical History:  Past Medical History   Diagnosis  Date   .  Hypertension    .  Glaucoma     Past Surgical History:  Past Surgical History   Procedure  Laterality  Date   .  Appendectomy     .  Cataract extraction     .  Esophagogastroduodenoscopy (egd) with esophageal dilation     .  Tee without cardioversion  N/A  04/24/2012     Procedure: TRANSESOPHAGEAL ECHOCARDIOGRAM (TEE); Surgeon: Thurmon Fair, MD; Location: [facility] ENDOSCOPY; Service: Cardiovascular; Laterality: N/A;    Assessment & Plan  Clinical Impression: Patient is a 79 y.o. right hand female with history of hypertension who lives alone in [place]. She uses a rolling walker in her home for ambulation. Admitted 04/21/2012 with progressive left-sided weakness over a seven-day period. MRI of the brain showed multifocal areas of acute cerebral infarct. These lie within the right MCA territory. 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 Patient did not receive TPA. Echocardiogram with ejection fraction of 65-70% without emboli. Carotid Dopplers with right 40-59% ICA stenosis. TEE showed no thrombus or vegetation.. Neurology services consulted maintained on Plavix therapy for stroke prophylaxis as well as subcutaneous heparin for DVT prophylaxis. Question atrial flutter 04/25/2012 at 5:35 AM EKG strip was not available. Contact to neurology services as well as consult to cardiology Dr. Jacinto Halim and advised to change Plavix to her Pradaxa. Her subcutaneous heparin was discontinued once Pradaxa was initiated. Patient remained  asymptomatic. Physical and occupational therapy evaluation completed 04/22/2012 an ongoing. Patient transferred to CIR on 04/25/2012.   Pt referred by team for participation in community reintegration/outing and was agreeable to participate today.  Pt is scheduled for discharge home on 05/05/12.  No further TR treatment implemented at this time.  Skilled Therapeutic Interventions/Progress Updates: Pt participated in lunch outing to [employer] ambulatory level using RW with supervision on community surfaces, min assist for stepping up and down a curb and for stair climbing.  Pt identified and negotiated obstacles safely, problem solved how to carry needed items & access a public restroom.  Discussion/education on energy conservation techniques as well.  Pt stated understanding of the above and expressed that the outing was a valuable experience.    Jamesmichael Shadd 05/02/2012, 2:49 PM

## 2012-05-03 ENCOUNTER — Inpatient Hospital Stay (HOSPITAL_COMMUNITY): Payer: Medicare Other | Admitting: *Deleted

## 2012-05-03 ENCOUNTER — Inpatient Hospital Stay (HOSPITAL_COMMUNITY): Payer: Medicare Other

## 2012-05-03 ENCOUNTER — Inpatient Hospital Stay (HOSPITAL_COMMUNITY): Payer: Medicare Other | Admitting: Physical Therapy

## 2012-05-03 NOTE — Patient Care Conference (Signed)
Inpatient RehabilitationTeam Conference and Plan of Care Update Date: 05/03/2012   Time: 10:55 Am    Patient Name: Sandra Larson      Medical Record Number: [id number]  Date of Birth: 1913-10-23 Sex: Female         Room/Bed: 4030/4030-01 Payor Info: Payor: MEDICARE  Plan: MEDICARE PART A AND B  Product Type: *No Product type*     Admitting Diagnosis: RT MCA CVA  Admit Date/Time:  04/25/2012  3:59 PM Admission Comments: No comment available   Primary Diagnosis:  Embolic infarction Principal Problem: Embolic infarction  Patient Active Problem List   Diagnosis Date Noted  . Embolic infarction 04/26/2012  . Atrial fibrillation 04/25/2012  . CVA (cerebral infarction) 04/21/2012  . HTN (hypertension) 04/21/2012    Expected Discharge Date: Expected Discharge Date: 05/05/12  Team Members Present: Physician leading conference: Dr. Claudette Laws Social Worker Present: Dossie Der, LCSW Nurse Present: Other (comment) Minette Brine) PT Present: Edman Circle, PT;Caroline Adriana Simas, PT;Other (comment) Clarisse Gouge ripa-PT) OT Present: Bretta Bang, OT Other (Discipline and Name): marie Noel-PPS & Elnora Morrison     Current Status/Progress Goal Weekly Team Focus  Medical   Right knee pain, Also right calf pain  Diagnosis and manage  Further workup, knee orthosis   Bowel/Bladder   pt cont of B/B. LBM- 05/02/12  mod independent  remain cont of B/B. toilet q 3hrs    Swallow/Nutrition/ Hydration     na        ADL's   supervision transfers with RW, supervision bathing and dressing at sit to stand level.  Supervision with higher level ADL tasks in kitchen  Mod I overall  higher level balance and advanced ADLs, LUE NM re-ed   Mobility   Supervision overall  mod I overall except supervision stairs  Balance, gait, pain management R knee and L shoulder   Communication     na        Safety/Cognition/ Behavioral Observations    na        Pain   no c/o pain or discomfort. Tylenol  650mg  po q 4hrs as needed and pt has sched voltaren gel to right knee four times daily.  will cont to have no c/o pain.   will cont to monitor assess medicate as needed.   Skin   skin CDI abdominal bruising   will have no new skin breakdown/Infection while on rehab.   will cont to monitor and assess skin qshift.       *See Care Plan and progress notes for long and short-term goals.  Barriers to Discharge: None family training in progress    Possible Resolutions to Barriers:  See above    Discharge Planning/Teaching Needs:  Home with family to stay with short time, here to observe in therapies daily      Team Discussion:  Progressing toward her goals-need family education tomorrow.  Pin controlled with voltaren creme and tylenol  Revisions to Treatment Plan:  None   Continued Need for Acute Rehabilitation Level of Care: The patient requires daily medical management by a physician with specialized training in physical medicine and rehabilitation for the following conditions: Daily direction of a multidisciplinary physical rehabilitation program to ensure safe treatment while eliciting the highest outcome that is of practical value to the patient.: Yes Daily medical management of patient stability for increased activity during participation in an intensive rehabilitation regime.: Yes Daily analysis of laboratory values and/or radiology reports with any subsequent need for medication adjustment of  medical intervention for : Neurological problems  Lucy Chris 05/05/2012, 10:24 AM

## 2012-05-03 NOTE — Progress Notes (Signed)
Social Work Patient ID: Sandra Larson, female   DOB: 04/23/1913, 79 y.o.   MRN: [id number] Met with pt and spoke with [name] to discuss team conference reaching goals for discharge 4/18.  Both are pleased with how well pt has done here. Have scheduled family education for tomorrow 2:00 pm with daughter.  Will discuss any DME and follow up needs.

## 2012-05-03 NOTE — Progress Notes (Signed)
Physical Therapy Session Note  Patient Details  Name: Sandra Larson MRN: [id number] Date of Birth: 1913-06-28  Today's Date: 05/03/2012 Time: 1430-1500 Time Calculation (min): 30 min  Short Term Goals: Week 1:  PT Short Term Goal 1 (Week 1): = LTG of mod I overall  Skilled Therapeutic Interventions/Progress Updates:   Granddaughter present to observe therapy session.  Patient reporting that she was able to ambulate most of the time during outing yesterday but is having increased R knee pain today.  Reports shoulder pain is better.  Patient agreeably to therapy despite pain.  Pt verbalized and demonstrated mod I transfer w/c > simulated car with RW sitting first and then placing LE into car.  Granddaughter reports this is how patient performed PTA.  Also demonstrated stair negotiation up and down 4 stairs with two rails with min A secondary to knee pain ascending with LLE, descending with RLE.  Granddaughter reports that there are two rails but patient can only reach one at a time.  Patient return demonstrated up and down 4 stairs with R rail with bilat UE support stepping laterally to ascend with min A and step to sequence and then with one UE support on L rail and HHA to descend with step to sequence.  Will practice again tomorrow with daughter present.  Granddaughter asking how to adjust RW at home to fit patient's height.  Educated on how to measure height of RW to patient's wrist and how to adjust.  Also discussed day of D/C with patient and granddaughter and f/u therapy recommendations.  Pt and granddaughter verbalized agreement.   Therapy Documentation Precautions:  Precautions Precautions: Fall;Shoulder;Knee Type of Shoulder Precautions: Minor subluxation L shoulder; keep supported Precaution Comments: R knee pain/OA Required Braces or Orthoses: Other Brace/Splint Other Brace/Splint: Lt wrist Restrictions Weight Bearing Restrictions: No Vital Signs: Therapy Vitals Temp: 97.6 F  (36.4 C) Temp src: Oral Pulse Rate: 78 Resp: 20 BP: 129/64 mmHg Patient Position, if appropriate: Sitting Oxygen Therapy SpO2: 100 % O2 Device: None (Room air) Pain: Pain Assessment Pain Assessment: 0-10 Pain Score:   5 Pain Type: Chronic pain Pain Location: Knee Pain Orientation: Right Pain Descriptors: Aching;Sore Pain Onset: On-going Pain Intervention(s): Other (Comment) (premedicated) Multiple Pain Sites: No Locomotion : Ambulation Ambulation/Gait Assistance: 5: Supervision Wheelchair Mobility Distance: 150   See FIM for current functional status  Therapy/Group: Individual Therapy  Edman Circle [facility] 05/03/2012, 3:54 PM

## 2012-05-03 NOTE — Progress Notes (Signed)
Occupational Therapy Session Note  Patient Details  Name: Sandra Larson MRN: [id number] Date of Birth: 21-Jul-1913  Today's Date: 05/03/2012 Time: 4098-1191 and 4782-9562 Time Calculation (min): 55 min and 30 min  Short Term Goals: STG=LTG due to short LOS  Skilled Therapeutic Interventions/Progress Updates:    Session 1: Therapy session focused on ADL retraining, standing tolerance, functional endurance, and dynamic standing balance. Patient gathered clothing from closet and top drawer on this date with close supervision. OT educated on placing clothing over front of RW to transport and patient demonstrated ability to do this. Patient completed oral care and washing of BLE while standing at sink with supervision as this is what she does at home. Patient sat to wash feet while propped on stool. Patient demonstrated increased initiation of using LUE during self-care tasks (e.g. Wringing wash cloth and reaching for clothing items as they were placed on left side). Patient recalled suggestions made yesterday to conserve energy during self-care tasks.   Session 2: Patient sitting in w/c upon OT arrival with granddaughter present. Therapy session focused on dynamic standing balance during higher level ADL tasks while also completing family education and safety. Patient verbalized to OT setup of kitchen with relation to countertop and table where she would eat. OT simulated environment and had patient practice removing dishes from overhead cabinet and placing on table. Patient completed activity at Mod I level using RW. OT provided education on energy conservation and work simplification techniques to patient and granddaughter, both of whom were accepting. OT also spoke with granddaughter about having a chair for patient to complete sponge baths with and she agreed that would be much safer. Patient practiced standing with RW and reaching out of bos to hang clothing on hangers and place in closet. Pt  completed at Mod I level with no lob observed.   Therapy Documentation Precautions:  Precautions Precautions: Fall;Shoulder;Knee Type of Shoulder Precautions: Minor subluxation L shoulder; keep supported Precaution Comments: R knee pain/OA Required Braces or Orthoses: Other Brace/Splint Other Brace/Splint: Lt wrist Restrictions Weight Bearing Restrictions: No  Pain:   Patient c/o pain in Rt knee rating it as "high up there" when given 0-10 scale.  Patient reported pain in Rt knee during therapy session however said she did not have any Lt shoulder pain.    See FIM for current functional status  Therapy/Group: Individual Therapy  Daneil Dan 05/03/2012, 11:58 AM

## 2012-05-03 NOTE — Progress Notes (Signed)
Patient ID: Sandra Larson, female   DOB: 1913/05/25, 79 y.o.   MRN: [id number] Subjective/Complaints: 79 y.o. right hand female with history of hypertension who lives alone in [place]. She uses a rolling walker in her home for ambulation. Admitted 04/21/2012 with progressive left-sided weakness over a seven-day period. MRI of the brain showed multifocal areas of acute cerebral infarct. These lie within the right MCA territory. 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 Patient did not receive TPA. Echocardiogram with ejection fraction of 65-70% without emboli. Carotid Dopplers with right 40-59% ICA stenosis. TEE showed no thrombus or vegetation.. Neurology services consulted maintained on Plavix therapy for stroke prophylaxis as well as subcutaneous heparin for DVT prophylaxis. Question atrial flutter 04/25/2012 at 5:35 AM EKG strip was not available. Contact to neurology services as well as consult to cardiology Dr. Jacinto Halim and advised to change Plavix to her Pradaxa. Her subcutaneous heparin was discontinued once Pradaxa was initiated  Last BM 4/13, Has knee pain on Right gets some relief from tylenol but not taking  Review of Systems  Respiratory: Negative for shortness of breath.   Cardiovascular: Negative for chest pain.  Gastrointestinal: Negative for heartburn, abdominal pain and diarrhea.  All other systems reviewed and are negative.   Objective: Vital Signs: Blood pressure 133/67, pulse 78, temperature 97.4 F (36.3 C), temperature source Oral, resp. rate 17, height 5' 4.96" (1.65 m), weight 58 kg (127 lb 13.9 oz), SpO2 97.00%. No results found. Results for orders placed during the hospital encounter of 04/25/12 (from the past 72 hour(s))  BASIC METABOLIC PANEL     Status: Abnormal   Collection Time    05/01/12  6:16 AM      Result Value Range   Sodium 134 (*) 135 - 145 mEq/L   Potassium 3.9  3.5 - 5.1 mEq/L   Chloride 98  96 - 112 mEq/L   CO2 26  19 - 32 mEq/L   Glucose, Bld 95  70 - 99 mg/dL   BUN 36 (*) 6 - 23 mg/dL   Creatinine, Ser 4.09 (*) 0.50 - 1.10 mg/dL   Calcium 9.1  8.4 - 81.1 mg/dL   GFR calc non Af Amer 31 (*) >90 mL/min   GFR calc Af Amer 36 (*) >90 mL/min   Comment:            The eGFR has been calculated     using the CKD EPI equation.     This calculation has not been     validated in all clinical     situations.     eGFR's persistently     <90 mL/min signify     possible Chronic Kidney Disease.     HEENT: normal and poor dentition Cardio: RRR and no murmur Resp: CTA B/L and unlabored GI: BS positive and not distended Extremity:  No Edema Skin:   Intact Neuro: Alert/Oriented, Flat, Cranial Nerve II-XII normal, Abnormal Motor 2-/5 Left Delt,bi,tri,grip,wrist ext, 4-/5 Left HF,KE,Ankle DF/PF, Abnormal FMC Ataxic/ dec FMC, Inattention and Other Mild left visual neglect Musc/Skel:  Other R knee pain over patellar with resisted extension, Left wrist non tender no erythema or swelling,no finger pain with ROM Gen NAD   Assessment/Plan: 1. Functional deficits secondary to Embolic R MCA infarct which require 3+ hours per day of interdisciplinary therapy in a comprehensive inpatient rehab setting. Physiatrist is providing close team supervision and 24 hour management of active medical  problems listed below. Physiatrist and rehab team continue to assess barriers to discharge/monitor patient progress toward functional and medical goals. FIM: FIM - Bathing Bathing Steps Patient Completed: Chest;Right Arm;Left Arm;Abdomen;Front perineal area;Buttocks;Right upper leg;Left upper leg;Right lower leg (including foot);Left lower leg (including foot) Bathing: 6: More than reasonable amount of time  FIM - Upper Body Dressing/Undressing Upper body dressing/undressing steps patient completed: Thread/unthread right bra strap;Thread/unthread left bra strap;Hook/unhook bra;Thread/unthread  right sleeve of pullover shirt/dresss;Thread/unthread left sleeve of pullover shirt/dress;Put head through opening of pull over shirt/dress;Pull shirt over trunk Upper body dressing/undressing: 5: Set-up assist to: Obtain clothing/put away FIM - Lower Body Dressing/Undressing Lower body dressing/undressing steps patient completed: Thread/unthread right underwear leg;Thread/unthread left underwear leg;Pull underwear up/down;Thread/unthread right pants leg;Thread/unthread left pants leg;Pull pants up/down;Don/Doff left shoe;Don/Doff right shoe;Fasten/unfasten right shoe;Fasten/unfasten left shoe Lower body dressing/undressing: 5: Set-up assist to: Don/Doff TED stocking  FIM - Toileting Toileting steps completed by patient: Adjust clothing prior to toileting;Performs perineal hygiene;Adjust clothing after toileting Toileting Assistive Devices: Grab bar or rail for support Toileting: 6: Assistive device: No helper  FIM - Diplomatic Services operational officer Devices: Elevated toilet seat;Grab bars;Walker Toilet Transfers: 5-To toilet/BSC: Supervision (verbal cues/safety issues);5-From toilet/BSC: Supervision (verbal cues/safety issues)  FIM - Banker Devices: Therapist, occupational: 5: Bed > Chair or W/C: Supervision (verbal cues/safety issues);5: Chair or W/C > Bed: Supervision (verbal cues/safety issues)  FIM - Locomotion: Wheelchair Distance: 150 Locomotion: Wheelchair: 1: Total Assistance/staff pushes wheelchair (Pt<25%) FIM - Locomotion: Ambulation Locomotion: Ambulation Assistive Devices: Designer, industrial/product Ambulation/Gait Assistance: 5: Supervision Locomotion: Ambulation: 1: Travels less than 50 ft with supervision/safety issues  Comprehension Comprehension Mode: Auditory Comprehension: 6-Follows complex conversation/direction: With extra time/assistive device  Expression Expression Mode: Verbal Expression: 6-Expresses complex ideas:  With extra time/assistive device  Social Interaction Social Interaction: 7-Interacts appropriately with others - No medications needed.  Problem Solving Problem Solving: 6-Solves complex problems: With extra time  Memory Memory: 6-More than reasonable amt of time  Medical Problem List and Plan:  1. Suspected embolic Right MCA infarct  2. DVT Prophylaxis/Anticoagulation: Subcutaneous heparin discontinued today after Pradaxa initiated. Monitor for any signs of bleeding. Minimize fall risks  3. Neuropsych: This patient is capable of making decisions on his/her own behalf.  4. Hypertension/atrial flutter. Avapro 300 mg daily, hydrochlorothiazide 12.5 mg daily. Closely monitor bp/hr with increased mobility.Pradaxa initiated for atrial flutter as well as stroke prophylaxis.  -adjust regimen as needed  5. Hyperlipidemia. Zocor  6. Glaucoma. Continue eye drops as advised 7.  Chronic GERD PPI q am 8.  R knee pain likely OA diclofenac and tylenol LOS (Days) 8 A FACE TO FACE EVALUATION WAS PERFORMED  KIRSTEINS,ANDREW E 05/03/2012, 10:13 AM

## 2012-05-03 NOTE — Progress Notes (Signed)
Physical Therapy Session Note  Patient Details  Name: Sandra Larson MRN: [id number] Date of Birth: 09/10/1913  Today's Date: 05/03/2012 Time: 1133-1200 and 4098-1191 Time Calculation (min): 27 min and 40 min  Short Term Goals: Week 1:  PT Short Term Goal 1 (Week 1): = LTG of mod I overall  Skilled Therapeutic Interventions/Progress Updates:    AM Session: Patient received from nurse tech during toileting. Patient completed toileting and all aspects (transfer, hygiene, clothing management) with supervision. Patient ambulates bathroom>sink with rolling walker and supervision and stands at sink to wash hands with supervision.   Patient transported to gym and exercised on NuStep Level 3 x8' with B LE and R UE (patient unable to tolerate use of L UE secondary to c/o L shoulder pain during activity). Patient returned to room and left seated in wheelchair with all needs within reach.  PM Session: Patient received sitting in wheelchair. This session focused on LE strengthening secondary to patient's c/o "severe R knee pain". Patient declines ambulation and standing balance activities due to R knee pain. Supine on mat, patient performed B SLR, bridges with 3" hold, B hip adduction with ball squeeze in hooklying, B hip abd with green theraband in hooklying. In hooklying, passive lumbar rotations x2' to assist with trunk rotation ROM. In sitting, patient performed B hip flexion, B knee extension, ankle pumps; all exercises x20 reps. Patient requires frequent rest breaks. Patient requires min assist for supine>sit on flat mat without use of bed rails.  Patient returned to room and left sitting in wheelchair with all needs within reach and visitor present.  Therapy Documentation Precautions:  Precautions Precautions: Fall;Shoulder;Knee Type of Shoulder Precautions: Minor subluxation L shoulder; keep supported Precaution Comments: R knee pain/OA Required Braces or Orthoses: Other Brace/Splint Other  Brace/Splint: Lt wrist Restrictions Weight Bearing Restrictions: No Pain: Pain Assessment Pain Assessment: 0-10 Pain Score:   6 Pain Type: Chronic pain Pain Location: Knee Pain Orientation: Right Pain Descriptors: Aching;Sore Pain Onset: On-going Pain Intervention(s): Repositioned;Ambulation/increased activity;RN made aware Multiple Pain Sites: No Locomotion : Ambulation Ambulation/Gait Assistance: 5: Supervision Wheelchair Mobility Distance: 150   See FIM for current functional status  Therapy/Group: Individual Therapy  Chipper Herb. Fraya Ueda, PT, DPT  05/03/2012, 12:15 PM

## 2012-05-04 ENCOUNTER — Inpatient Hospital Stay (HOSPITAL_COMMUNITY): Payer: Medicare Other | Admitting: Occupational Therapy

## 2012-05-04 ENCOUNTER — Inpatient Hospital Stay (HOSPITAL_COMMUNITY): Payer: Medicare Other | Admitting: *Deleted

## 2012-05-04 DIAGNOSIS — I6340 Cerebral infarction due to embolism of unspecified cerebral artery: Secondary | ICD-10-CM

## 2012-05-04 DIAGNOSIS — I4891 Unspecified atrial fibrillation: Secondary | ICD-10-CM

## 2012-05-04 MED ORDER — IRBESARTAN 300 MG PO TABS: 300.0000 mg | ORAL_TABLET | Freq: Every day | ORAL | Status: DC

## 2012-05-04 MED ORDER — SENNOSIDES-DOCUSATE SODIUM 8.6-50 MG PO TABS: 1.0000 | ORAL_TABLET | Freq: Every day | ORAL | Status: DC

## 2012-05-04 MED ORDER — PANTOPRAZOLE SODIUM 40 MG PO TBEC: 40.0000 mg | DELAYED_RELEASE_TABLET | Freq: Every day | ORAL | Status: DC

## 2012-05-04 MED ORDER — SIMVASTATIN 20 MG PO TABS: 20.0000 mg | ORAL_TABLET | Freq: Every day | ORAL | Status: DC

## 2012-05-04 MED ORDER — ACETAMINOPHEN 325 MG PO TABS: 325.0000 mg | ORAL_TABLET | ORAL | Status: AC | PRN

## 2012-05-04 MED ORDER — DICLOFENAC SODIUM 1 % TD GEL: 2.0000 g | Freq: Four times a day (QID) | TRANSDERMAL | Status: DC

## 2012-05-04 MED ORDER — DABIGATRAN ETEXILATE MESYLATE 75 MG PO CAPS: 75.0000 mg | ORAL_CAPSULE | Freq: Two times a day (BID) | ORAL | Status: DC

## 2012-05-04 NOTE — Progress Notes (Signed)
Patient ID: Sandra Larson, female   DOB: [birth year], 79 y.o.   MRN: [id number] Subjective/Complaints: 79 y.o. right hand female with history of hypertension who lives alone in [place]. She uses a rolling walker in her home for ambulation. Admitted 04/21/2012 with progressive left-sided weakness over a seven-day period. MRI of the brain showed multifocal areas of acute cerebral infarct. These lie within the right MCA territory. 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 Patient did not receive TPA. Echocardiogram with ejection fraction of 65-70% without emboli. Carotid Dopplers with right 40-59% ICA stenosis. TEE showed no thrombus or vegetation.. Neurology services consulted maintained on Plavix therapy for stroke prophylaxis as well as subcutaneous heparin for DVT prophylaxis. Question atrial flutter 04/25/2012 at 5:35 AM EKG strip was not available. Contact to neurology services as well as consult to cardiology Dr. Jacinto Halim and advised to change Plavix to her Pradaxa. Her subcutaneous heparin was discontinued once Pradaxa was initiated C/O R calf pain Last BM 4/16, Has knee pain on Right gets some relief from tylenol but not taking  Review of Systems  Respiratory: Negative for shortness of breath.   Cardiovascular: Negative for chest pain.  Gastrointestinal: Negative for heartburn, abdominal pain and diarrhea.  All other systems reviewed and are negative.   Objective: Vital Signs: Blood pressure 119/69, pulse 71, temperature 98.1 F (36.7 C), temperature source Oral, resp. rate 18, height 5' 4.96" (1.65 m), weight 59.5 kg (131 lb 2.8 oz), SpO2 100.00%. No results found. No results found for this or any previous visit (from the past 72 hour(s)).   HEENT: normal and poor dentition Cardio: RRR and no murmur Resp: CTA B/L and unlabored GI: BS positive and not distended Extremity:  No Edema Skin:   Intact Neuro:  Alert/Oriented, Flat, Cranial Nerve II-XII normal, Abnormal Motor 2-/5 Left Delt,bi,tri,grip,wrist ext, 4-/5 Left HF,KE,Ankle DF/PF, Abnormal FMC Ataxic/ dec FMC, Inattention and Other Mild left visual neglect Musc/Skel:  Other R knee pain over patellar with resisted extension, Left wrist non tender no erythema or swelling,no finger pain with ROM Gen NAD   Assessment/Plan: 1. Functional deficits secondary to Embolic R MCA infarct which require 3+ hours per day of interdisciplinary therapy in a comprehensive inpatient rehab setting. Physiatrist is providing close team supervision and 24 hour management of active medical problems listed below. Physiatrist and rehab team continue to assess barriers to discharge/monitor patient progress toward functional and medical goals. FIM: FIM - Bathing Bathing Steps Patient Completed: Chest;Right Arm;Left Arm;Abdomen;Front perineal area;Buttocks;Right upper leg;Left upper leg;Right lower leg (including foot);Left lower leg (including foot) Bathing: 6: More than reasonable amount of time  FIM - Upper Body Dressing/Undressing Upper body dressing/undressing steps patient completed: Thread/unthread right bra strap;Thread/unthread left bra strap;Hook/unhook bra;Thread/unthread right sleeve of pullover shirt/dresss;Thread/unthread left sleeve of pullover shirt/dress;Put head through opening of pull over shirt/dress;Pull shirt over trunk Upper body dressing/undressing: 6: More than reasonable amount of time FIM - Lower Body Dressing/Undressing Lower body dressing/undressing steps patient completed: Thread/unthread right underwear leg;Thread/unthread left underwear leg;Pull underwear up/down;Thread/unthread right pants leg;Thread/unthread left pants leg;Pull pants up/down;Don/Doff left shoe;Don/Doff right shoe;Fasten/unfasten right shoe;Fasten/unfasten left shoe Lower body dressing/undressing: 5: Set-up assist to: Don/Doff TED stocking  FIM - Toileting Toileting  steps completed by patient: Adjust clothing prior to toileting;Performs perineal hygiene;Adjust clothing after toileting Toileting Assistive Devices: Grab bar or rail for support Toileting: 6: Assistive device: No helper  FIM - Diplomatic Services operational officer Devices: Elevated  toilet seat;Grab bars;Walker Toilet Transfers: 5-To toilet/BSC: Supervision (verbal cues/safety issues);5-From toilet/BSC: Supervision (verbal cues/safety issues)  FIM - Banker Devices: Therapist, occupational: 5: Bed > Chair or W/C: Supervision (verbal cues/safety issues);5: Chair or W/C > Bed: Supervision (verbal cues/safety issues)  FIM - Locomotion: Wheelchair Distance: 150 Locomotion: Wheelchair: 1: Total Assistance/staff pushes wheelchair (Pt<25%) FIM - Locomotion: Ambulation Locomotion: Ambulation Assistive Devices: Designer, industrial/product Ambulation/Gait Assistance: 5: Supervision Locomotion: Ambulation: 1: Travels less than 50 ft with supervision/safety issues  Comprehension Comprehension Mode: Auditory Comprehension: 6-Follows complex conversation/direction: With extra time/assistive device  Expression Expression Mode: Verbal Expression: 6-Expresses complex ideas: With extra time/assistive device  Social Interaction Social Interaction: 7-Interacts appropriately with others - No medications needed.  Problem Solving Problem Solving: 6-Solves complex problems: With extra time  Memory Memory: 6-More than reasonable amt of time  Medical Problem List and Plan:  1. Suspected embolic Right MCA infarct  2. DVT Prophylaxis/Anticoagulation: Subcutaneous heparin discontinued today after Pradaxa initiated. Monitor for any signs of bleeding. Minimize fall risks , has calf tenderness check doppler 3. Neuropsych: This patient is capable of making decisions on his/her own behalf.  4. Hypertension/atrial flutter. Avapro 300 mg daily, hydrochlorothiazide 12.5 mg  daily. Closely monitor bp/hr with increased mobility.Pradaxa initiated for atrial flutter as well as stroke prophylaxis.  -adjust regimen as needed  5. Hyperlipidemia. Zocor  6. Glaucoma. Continue eye drops as advised 7.  Chronic GERD PPI q am 8.  R knee pain likely OA diclofenac and tylenol LOS (Days) 9 A FACE TO FACE EVALUATION WAS PERFORMED  KIRSTEINS,ANDREW E 05/04/2012, 7:30 AM

## 2012-05-04 NOTE — Discharge Summary (Signed)
  Discharge summary job [id number]

## 2012-05-04 NOTE — Progress Notes (Signed)
Physical Therapy Session Note  Patient Details  Name: Sandra Larson MRN: [id number] Date of Birth: 11/21/1913  Today's Date: 05/04/2012 Time: 11:25-11:55 ( ) and  1027-2536 Time Calculation (min): 70 min  Short Term Goals: Week 1:  PT Short Term Goal 1 (Week 1): = LTG of mod I overall  Skilled Therapeutic Interventions/Progress Updates:  1:2 Tx focused on in-room mobility and balance. Pt demonstrating bed mobility and bed<>chair transfers with Mod I, increased time required. Berg balance test administered to assess change, which increased from 22/56 to 35/56. Pt continues to have difficulty with SLS tasks and transitions. Pt advised regarding functional implications.     2:2 Family present for training. Pt continued to demonstrate safe in-room mobility with RW, Mod I overall with RW, but continues to complain of knee pain limiting function.  Gait training 2x60' with RW Mod I in controlled environment and x50' in home environment. Family again reminded on how to appropriately size RW at home and when to use transport chair.  Family return demonstrated stairs with pt, needing Min A for stability especially with descent.  Family also return demonstrated car transfer with S needed RW>car>WC with safe technique.  Pt returned to room and instructed again in [place] [facility], given handout for LAQ, HS curls, hip ABD, heel and toe raises. Reviewed pt with pictures, but only performed x5 each due to fatigue and knee pain.  Pt left with family, having no further questions or concerns. All needs in reach and advised to use call bell if she needed.   Therapy Documentation Precautions:  Precautions Precautions: Knee;Fall Type of Shoulder Precautions: Minor subluxation L shoulder; keep supported Precaution Comments: R knee pain/OA Required Braces or Orthoses: Other Brace/Splint Other Brace/Splint: Lt wrist Restrictions Weight Bearing Restrictions: No  Pain: 5/10 R knee pain, nursing made aware   See FIM for current functional status  Therapy/Group: Individual Therapy  Clydene Laming, PT, DPT  05/04/2012, 4:06 PM

## 2012-05-04 NOTE — Progress Notes (Signed)
Occupational Therapy Discharge Summary  Patient Details  Name: Sandra Larson MRN: [id number] Date of Birth: 1913/05/28  Today's Date: 05/04/2012  Patient has met 12 of 12 long term goals due to improved activity tolerance, improved balance, postural control, ability to compensate for deficits, functional use of  LEFT upper extremity, improved awareness and improved coordination.  Patient to discharge at overall Modified Independent level.  Patient's care partner is independent to provide the necessary physical assistance at discharge.    Reasons goals not met: N/A  Recommendation:  Patient will benefit from ongoing skilled OT services in home health setting to continue to advance functional skills in the area of BADL, iADL and Reduce care partner burden.  Equipment: No equipment provided  Reasons for discharge: treatment goals met and discharge from hospital  Patient/family agrees with progress made and goals achieved: Yes  OT Discharge Precautions/Restrictions  Precautions Precautions: Knee;Fall Type of Shoulder Precautions: Minor subluxation L shoulder; keep supported Precaution Comments: R knee pain/OA Restrictions Weight Bearing Restrictions: No General   Vital Signs Therapy Vitals Temp: 98.1 F (36.7 C) Temp src: Oral Pulse Rate: 71 Resp: 18 BP: 119/69 mmHg Patient Position, if appropriate: Lying Oxygen Therapy SpO2: 100 % O2 Device: None (Room air) Pain Pain Assessment Pain Assessment: No/denies pain ADL ADL Eating: Independent Where Assessed-Eating: Chair Grooming: Modified independent Where Assessed-Grooming: Standing at sink Upper Body Bathing: Modified independent Where Assessed-Upper Body Bathing: Standing at sink Lower Body Bathing: Modified independent Where Assessed-Lower Body Bathing: Sitting at sink;Standing at sink Upper Body Dressing: Modified independent (Device) Where Assessed-Upper Body Dressing: Sitting at sink Lower Body Dressing:  Modified independent Where Assessed-Lower Body Dressing: Sitting at sink Toileting: Modified independent Where Assessed-Toileting: Teacher, adult education: Modified Community education officer Method: Ambulating (with RW) Acupuncturist: Grab bars Vision/Perception  Vision - History Baseline Vision: Wears glasses all the time Visual History: Glaucoma Patient Visual Report: No change from baseline Vision - Assessment Eye Alignment: Within Functional Limits Ocular Range of Motion: Within Functional Limits Perception Perception: Within Functional Limits Praxis Praxis: Intact  Cognition Overall Cognitive Status: Within Functional Limits for tasks assessed Arousal/Alertness: Awake/alert Orientation Level: Oriented X4 Sensation Sensation WFL Coordination Gross Motor Movements are Fluid and Coordinated: Yes Fine Motor Movements are Fluid and Coordinated: No Coordination and Movement Description: increased time with [facility] with LUE secondary to weakness and decreased sustained grasp 9 Hole Peg Test: Rt: 45 seconds.  Lt: 1:45 Extremity/Trunk Assessment RUE Assessment RUE Assessment: Within Functional Limits LUE Assessment LUE Assessment: Exceptions to WFL LUE AROM (degrees) LUE Overall AROM Comments: shoulder flexion approx 90 degrees, elbow and wrist WFL LUE Strength LUE Overall Strength Comments: grossly 3 to 3+ overallLUE: decreased shoulder ROM and strength grossly 3+/5 overall  See FIM for current functional status  Leonette Monarch 05/04/2012, 4:10 PM

## 2012-05-04 NOTE — Discharge Summary (Signed)
Sandra Larson, Sandra Larson NO.:  Larson  MEDICAL RECORD NO.:  [id number]  LOCATION:  4030                         FACILITY:  [facility]  PHYSICIAN:  Erick Colace, M.D.DATE OF BIRTH:  12-09-1913  DATE OF ADMISSION:  04/25/2012 DATE OF DISCHARGE:  05/05/2012                              DISCHARGE SUMMARY   DISCHARGE DIAGNOSES: 1. Suspected embolic right middle cerebral artery infarction. 2. Subcutaneous heparin for deep venous thrombosis prophylaxis. 3. Hypertension with atrial flutter. 4. Hyperlipidemia. 5. Glaucoma.  HISTORY OF PRESENT ILLNESS:  This is a 79 year old right-handed white female with history of hypertension, who lives alone in [place], [place] and uses a rolling walker in her home for ambulation.  Admitted on April 21, 2012, with progressive left-sided weakness.  MRI of the brain showed multi focal areas of acute cerebral infarction.  These lie within the right MCA territory.  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  The patient did not receive TPA.  Echocardiogram with ejection fraction of 65%-70% without emboli.  Carotid Dopplers with right 40%-59% ICA stenosis.  TEE showed no thrombus or vegetation.  Neurology Services consulted.  Maintained on Plavix for stroke prophylaxis as well as subcutaneous heparin for DVT prophylaxis.  Question of atrial flutter April 25, 2012, at 5:30 am, contacted Neurology Services as well as Cardiology Services.  Advised to change Plavix to Pradaxa.  Her subcutaneous heparin was discontinued once Pradaxa was initiated.  The patient remained asymptomatic.  Physical occupational therapy ongoing. The patient was admitted for comprehensive rehab program.  PAST MEDICAL HISTORY:  See discharge diagnoses.  SOCIAL HISTORY:  Lives alone.  FUNCTIONAL HISTORY PRIOR TO ADMISSION:  Independent with a walker in the house.  She does not drive.  Functional  status upon admission to rehab services was minimal assist ambulate 160 feet with a rolling walker.  PHYSICAL EXAMINATION:  VITAL SIGNS:  Blood pressure 126/68, pulse 86, temperature 97, respirations 18. GENERAL:  This was an alert female, oriented x3.  Followed full commands. HEENT:  Pupils reactive to light. LUNGS:  Clear to auscultation. CARDIAC:  Rate controlled. ABDOMEN:  Soft, nontender.  Good bowel sounds.  REHABILITATION HOSPITAL COURSE:  The patient was admitted to inpatient rehab services with therapies initiated on a 3-hour daily basis consisting of physical therapy, occupational therapy, and rehabilitation nursing.  The following issues were addressed during the patient's rehabilitation stay.  Pertaining to Sandra Larson suspect embolic right MCA infarct remained stable, maintained on Pradaxa.  The patient would follow up Neurology Services.  Noted history of hypertension with new onset atrial flutter followed by Cardiology Services.  Remaining on Avapro.  She had been on low-dose hydrochlorothiazide.  This was discontinued due to some mild increase in her creatinine.  She had no chest pain or shortness of breath.  She remained on Zocor for hyperlipidemia.  Pertaining to her latest creatinine of 1.38, remained monitored blood pressures well controlled.  The patient received weekly collaborative interdisciplinary team conferences to discuss estimated length of stay, family teaching, and any barriers to discharge.  She was continent of bowel and bladder, supervision transfers with a rolling walker, supervision bathing  dressing and sit to stand level, supervision with higher level of activities of daily living tasks in the kitchen, supervision overall for functional mobility.  Family teaching was completed.  She was discharged to home on May 05, 2012, with ongoing therapies dictated per rehab services.  DISCHARGE MEDICATIONS: 1. Pradaxa 75 mg twice daily. 2. Voltaren  Gel 4 times daily to affected area. 3. Cosopt ophthalmic solution 1 drop both eyes twice daily. 4. Avapro 300 mg p.o. daily. 5. Xalatan ophthalmic solution 1 drop both eyes at bedtime. 6. Multivitamin 1 tablet daily. 7. Protonix 40 mg daily. 8. Senokot-S tablets 1 at bedtime, hold for loose stool. 9. Zocor 20 mg p.o. daily.  DIET:  Her diet was a heart healthy diet.  SPECIAL INSTRUCTIONS:  The patient would follow up with Dr. Claudette Larson at the outpatient rehab center on May 23, 2012; Dr. Delia Larson, Neurology Services in 1 month, call for appointment; and Dr. Royann Larson, Cardiology Services 2 weeks followup for atrial flutter.     Sandra Larson, P.A.   ______________________________ Erick Colace, M.D.    DA/MEDQ  D:  05/04/2012  T:  05/04/2012  Job:  [id number]  cc:   Pramod P. Pearlean Brownie, MD Thurmon Fair, MD Dina Rich, MD

## 2012-05-04 NOTE — Progress Notes (Signed)
Orthopedic Tech Progress Note Patient Details:  [name] [birth year] [id number] Called [facility] for knee brace talked to [facility]. Patient ID: Sandra Larson, female   DOB: 10-Jul-1913, 79 y.o.   MRN: [id number]   Jennye Moccasin 05/04/2012, 3:06 PM

## 2012-05-04 NOTE — Progress Notes (Signed)
Occupational Therapy Session Note  Patient Details  Name: Sandra Larson MRN: [id number] Date of Birth: March 07, 1913  Today's Date: 05/04/2012 Time: 0732-0830 and [id number] Time Calculation (min): 58 min and 45 min  Short Term Goals: Week 1:  OT Short Term Goal 1 (Week 1): STG = LTGs due to short LOS  Skilled Therapeutic Interventions/Progress Updates:    1) Pt completed ADL retraining at overall modified independent this session.  Encouraged pt to attempt bathing and dressing without turning to this clinician for additional cues and relying on education during stay on rehab for safety and energy conservation strategies.  Pt gathered clothing and all bathing items with RW and completed bathing at standing level except sitting to wash feet with use of step stool for increased access to feet.  Pt demonstrated ability to button shirt prior to donning over head and completed all tasks safely without any cues from this clinician.  Pt reports no need to purchase a shower chair, but plans to use transport chair or place another chair in bathroom for energy conservation and when washing feet.    2) Pt seen for 1:1 OT with focus on family education with emphasis on safety in home environment and energy conservation strategies.  Pt ambulated to toilet with distant supervision and completed toileting at mod I level.  Engaged in 9 hole peg test with Rt: 45 secs and Lt: 1:45 (significant improvement as initial assessment pt was unable to grasp or manipulate pegs).  Discussed energy conservation strategies with IADLs and ADLs of bathing and dressing with daughter and granddaughter, both were able to verbalize understanding. Engaged in teach back with pt able to verbalize understanding and verbalize to family energy conservation strategies with kitchen tasks and when bathing as well as adaptive techniques to complete dressing.  Provided pt with [facility] exercise handout to complete at home.  Pt and family report no  further questions.  Therapy Documentation Precautions:  Precautions Precautions: Knee;Fall Type of Shoulder Precautions: Minor subluxation L shoulder; keep supported Precaution Comments: R knee pain/OA Restrictions Weight Bearing Restrictions: No Pain: Pain Assessment Pain Assessment: No/denies pain ADL: ADL Eating: Independent Where Assessed-Eating: Chair Grooming: Modified independent Where Assessed-Grooming: Standing at sink Upper Body Bathing: Modified independent Where Assessed-Upper Body Bathing: Standing at sink Lower Body Bathing: Modified independent Where Assessed-Lower Body Bathing: Sitting at sink;Standing at sink Upper Body Dressing: Modified independent (Device) Where Assessed-Upper Body Dressing: Sitting at sink Lower Body Dressing: Modified independent Where Assessed-Lower Body Dressing: Sitting at sink Toileting: Modified independent Where Assessed-Toileting: Teacher, adult education: Modified Community education officer Method: Ambulating (with RW) Acupuncturist: Grab bars  See FIM for current functional status  Therapy/Group: Individual Therapy  Leonette Monarch 05/04/2012, 8:48 AM

## 2012-05-05 DIAGNOSIS — M79609 Pain in unspecified limb: Secondary | ICD-10-CM

## 2012-05-05 NOTE — Progress Notes (Signed)
Patient ID: Sandra Larson, female   DOB: 05/02/1913, 79 y.o.   MRN: [id number] Subjective/Complaints: 79 y.o. right hand female with history of hypertension who lives alone in [place]. She uses a rolling walker in her home for ambulation. Admitted 04/21/2012 with progressive left-sided weakness over a seven-day period. MRI of the brain showed multifocal areas of acute cerebral infarct. These lie within the right MCA territory. 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 Patient did not receive TPA. Echocardiogram with ejection fraction of 65-70% without emboli. Carotid Dopplers with right 40-59% ICA stenosis. TEE showed no thrombus or vegetation.. Neurology services consulted maintained on Plavix therapy for stroke prophylaxis as well as subcutaneous heparin for DVT prophylaxis. Question atrial flutter 04/25/2012 at 5:35 AM EKG strip was not available. Contact to neurology services as well as consult to cardiology Dr. Jacinto Halim and advised to change Plavix to her Pradaxa. Her subcutaneous heparin was discontinued once Pradaxa was initiated C/O R calf pain mainly with am Also with R knee pain.  Ordered knee brace Doppler done this am Review of Systems  Respiratory: Negative for shortness of breath.   Cardiovascular: Negative for chest pain.  Gastrointestinal: Negative for heartburn, abdominal pain and diarrhea.  All other systems reviewed and are negative.   Objective: Vital Signs: Blood pressure 119/74, pulse 66, temperature 97.7 F (36.5 C), temperature source Oral, resp. rate 18, height 5' 4.96" (1.65 m), weight 59.5 kg (131 lb 2.8 oz), SpO2 99.00%. No results found. No results found for this or any previous visit (from the past 72 hour(s)).   HEENT: normal and poor dentition Cardio: RRR and no murmur Resp: CTA B/L and unlabored GI: BS positive and not distended Extremity:  No Edema Skin:   Intact Neuro: Alert/Oriented,  Flat, Cranial Nerve II-XII normal, Abnormal Motor 2-/5 Left Delt,bi,tri,grip,wrist ext, 4-/5 Left HF,KE,Ankle DF/PF, Abnormal FMC Ataxic/ dec FMC, Inattention and Other Mild left visual neglect Musc/Skel:  Other R knee pain over patellar with resisted extension, Left wrist non tender no erythema or swelling,no finger pain with ROM, no calf pain to palpation today Gen NAD   Assessment/Plan: 1. Functional deficits secondary to Embolic R MCA infarct  Stable for D/C today, doppler negative for DVT F/u PCP in 1-2 weeks F/u PM&R 3 weeks See D/C summary See D/C instructions   FIM: FIM - Bathing Bathing Steps Patient Completed: Chest;Right Arm;Left Arm;Abdomen;Front perineal area;Buttocks;Right upper leg;Left upper leg;Right lower leg (including foot);Left lower leg (including foot) Bathing: 6: More than reasonable amount of time  FIM - Upper Body Dressing/Undressing Upper body dressing/undressing steps patient completed: Thread/unthread right bra strap;Thread/unthread left bra strap;Hook/unhook bra;Thread/unthread right sleeve of pullover shirt/dresss;Thread/unthread left sleeve of pullover shirt/dress;Put head through opening of pull over shirt/dress;Pull shirt over trunk;Button/unbutton shirt Upper body dressing/undressing: 6: More than reasonable amount of time FIM - Lower Body Dressing/Undressing Lower body dressing/undressing steps patient completed: Thread/unthread right underwear leg;Thread/unthread left underwear leg;Pull underwear up/down;Thread/unthread right pants leg;Thread/unthread left pants leg;Pull pants up/down;Don/Doff right shoe;Don/Doff left shoe;Fasten/unfasten right shoe;Fasten/unfasten left shoe Lower body dressing/undressing: 6: More than reasonable amount of time  FIM - Toileting Toileting steps completed by patient: Adjust clothing prior to toileting;Performs perineal hygiene;Adjust clothing after toileting Toileting Assistive Devices: Grab bar or rail for  support Toileting: 6: Assistive device: No helper  FIM - Diplomatic Services operational officer Devices: Elevated toilet seat;Grab bars;Walker Toilet Transfers: 6-Assistive device: No helper;6-To toilet/ BSC;6-From toilet/BSC  FIM - Bed/Chair  Transport planner Devices: Environmental consultant;Arm rests Bed/Chair Transfer: 6: Assistive device: no helper;6: More than reasonable amt of time  FIM - Locomotion: Wheelchair Distance: 150 Locomotion: Wheelchair: 1: Total Assistance/staff pushes wheelchair (Pt<25%) FIM - Locomotion: Ambulation Locomotion: Ambulation Assistive Devices: Designer, industrial/product Ambulation/Gait Assistance: 6: Modified independent (Device/Increase time) Locomotion: Ambulation: 5: Household Independent - travels 50 - 149 ft independent or modified independent  Comprehension Comprehension Mode: Auditory Comprehension: 6-Follows complex conversation/direction: With extra time/assistive device  Expression Expression Mode: Verbal Expression: 6-Expresses complex ideas: With extra time/assistive device  Social Interaction Social Interaction: 7-Interacts appropriately with others - No medications needed.  Problem Solving Problem Solving: 6-Solves complex problems: With extra time  Memory Memory: 6-More than reasonable amt of time  Medical Problem List and Plan:  1. Suspected embolic Right MCA infarct  2. DVT Prophylaxis/Anticoagulation: Subcutaneous heparin discontinued today after Pradaxa initiated. Monitor for any signs of bleeding. Minimize fall risks , has calf tenderness check doppler 3. Neuropsych: This patient is capable of making decisions on his/her own behalf.  4. Hypertension/atrial flutter. Avapro 300 mg daily, hydrochlorothiazide 12.5 mg daily. Closely monitor bp/hr with increased mobility.Pradaxa initiated for atrial flutter as well as stroke prophylaxis.  -adjust regimen as needed  5. Hyperlipidemia. Zocor  6. Glaucoma. Continue eye drops as  advised 7.  Chronic GERD PPI q am 8.  R knee pain likely OA diclofenac and tylenol,brace 9.  Calf pain mainly with activity No calf DVT  , may be referrred from R knee OA vs arterial claudication vs calf strain.  Can f/u with me as outpt LOS (Days) 10 A FACE TO FACE EVALUATION WAS PERFORMED  KIRSTEINS,ANDREW E 05/05/2012, 8:54 AM

## 2012-05-05 NOTE — Progress Notes (Signed)
Social Work Discharge Note Discharge Note  The overall goal for the admission was met for:   Discharge location: Yes-HOME FAMILY TO STAY TO PROVIDE CARE  Length of Stay: Yes-10 DAYS  Discharge activity level: Yes-MOD/I-SUPERVISION LEVEL  Home/community participation: Yes  Services provided included: MD, RD, PT, OT, RN, Pharmacy and SW  Financial Services: Medicare and Private Insurance: [employer]  Follow-up services arranged: Home Health: [facility] and Patient/Family has no preference for HH/DME agencies  Comments (or additional information):FAMILY EDUCATION COMPLETED YESTERDAY READY TO GO HOME  Patient/Family verbalized understanding of follow-up arrangements: Yes  Individual responsible for coordination of the follow-up plan: [facility]  Confirmed correct DME delivered: Lucy Chris 05/05/2012    Lucy Chris

## 2012-05-05 NOTE — Progress Notes (Signed)
*  PRELIMINARY RESULTS* Vascular Ultrasound Right lower extremity venous duplex has been completed.  Preliminary findings: Right:  No evidence of DVT, superficial thrombosis, or Baker's cyst.   Farrel Demark, RDMS, RVT  05/05/2012, 9:09 AM

## 2012-05-05 NOTE — Progress Notes (Signed)
Pt discharged home with daughter, Marissa Nestle PA provided discharge instructions and prescriptions, Pt and family verbalized an understanding and denied any questions or concerns

## 2012-05-05 NOTE — Progress Notes (Addendum)
Physical Therapy Discharge Summary  Patient Details  Name: Sandra Larson MRN: [id number] Date of Birth: 08-Sep-1913  Today's Date: 05/05/2012  Patient has made excellent progress and has met 7 of 7 long term goals due to improved activity tolerance, improved balance, increased strength, ability to compensate for deficits, functional use of  left upper extremity and left lower extremity and improved coordination.  Patient to discharge at an ambulatory level Modified Independent with RW.  Patient's balance did improve from a score of 22/56 to 35/56 on the Berg balance assessment. Patient continues to demonstrate increased fall risk as noted by score of 35/56 on Berg Balance Scale.  (<36= high risk for falls, close to 100%; 37-45 significant >80%; 46-51 moderate >50%; 52-55 lower >25%).  Patient recommended to continue to use RW for household and community ambulation to minimize falls risk.  Patient's care partner is independent to provide the necessary supervision assistance at discharge for car transfers and stair negotiation in and out of the house.  Reasons goals not met: All goals met  Recommendation:  Patient will benefit from ongoing skilled PT services in outpatient setting to continue to advance safe functional mobility, address ongoing impairments in LUE and bilat LE weakness, pain in R knee, impaired dynamic balance, gait, and minimize fall risk.  Equipment: No equipment provided  Reasons for discharge: treatment goals met and discharge from hospital  Patient/family agrees with progress made and goals achieved: Yes   See FIM for current functional status  Edman Circle [facility] 05/05/2012, 8:28 AM

## 2012-05-23 ENCOUNTER — Inpatient Hospital Stay: Payer: Medicare Other | Admitting: Physical Medicine & Rehabilitation

## 2017-05-11 ENCOUNTER — Emergency Department: Payer: Medicare Other

## 2017-05-11 ENCOUNTER — Observation Stay
Admission: EM | Admit: 2017-05-11 | Discharge: 2017-05-12 | Disposition: A | Discharge status: Nursing Home (Includes ICF) | Payer: Medicare Other | Attending: Internal Medicine | Admitting: Internal Medicine

## 2017-05-11 DIAGNOSIS — H3530 Unspecified macular degeneration: Secondary | ICD-10-CM | POA: Insufficient documentation

## 2017-05-11 DIAGNOSIS — R739 Hyperglycemia, unspecified: Secondary | ICD-10-CM | POA: Insufficient documentation

## 2017-05-11 DIAGNOSIS — N183 Chronic kidney disease, stage 3 (moderate): Secondary | ICD-10-CM | POA: Diagnosis not present

## 2017-05-11 DIAGNOSIS — I69334 Monoplegia of upper limb following cerebral infarction affecting left non-dominant side: Secondary | ICD-10-CM | POA: Diagnosis not present

## 2017-05-11 DIAGNOSIS — R531 Weakness: Principal | ICD-10-CM

## 2017-05-11 DIAGNOSIS — Z79899 Other long term (current) drug therapy: Secondary | ICD-10-CM | POA: Insufficient documentation

## 2017-05-11 DIAGNOSIS — E785 Hyperlipidemia, unspecified: Secondary | ICD-10-CM | POA: Diagnosis not present

## 2017-05-11 DIAGNOSIS — I129 Hypertensive chronic kidney disease with stage 1 through stage 4 chronic kidney disease, or unspecified chronic kidney disease: Secondary | ICD-10-CM | POA: Diagnosis not present

## 2017-05-11 DIAGNOSIS — E43 Unspecified severe protein-calorie malnutrition: Secondary | ICD-10-CM

## 2017-05-11 DIAGNOSIS — R7989 Other specified abnormal findings of blood chemistry: Secondary | ICD-10-CM

## 2017-05-11 DIAGNOSIS — H409 Unspecified glaucoma: Secondary | ICD-10-CM | POA: Diagnosis not present

## 2017-05-11 DIAGNOSIS — Z681 Body mass index (BMI) 19 or less, adult: Secondary | ICD-10-CM | POA: Diagnosis not present

## 2017-05-11 DIAGNOSIS — R778 Other specified abnormalities of plasma proteins: Secondary | ICD-10-CM

## 2017-05-11 DIAGNOSIS — I482 Chronic atrial fibrillation: Secondary | ICD-10-CM | POA: Insufficient documentation

## 2017-05-11 DIAGNOSIS — D649 Anemia, unspecified: Secondary | ICD-10-CM

## 2017-05-11 DIAGNOSIS — D509 Iron deficiency anemia, unspecified: Secondary | ICD-10-CM | POA: Diagnosis not present

## 2017-05-11 DIAGNOSIS — Z7901 Long term (current) use of anticoagulants: Secondary | ICD-10-CM | POA: Diagnosis not present

## 2017-05-11 DIAGNOSIS — R636 Underweight: Secondary | ICD-10-CM | POA: Insufficient documentation

## 2017-05-11 DIAGNOSIS — E860 Dehydration: Secondary | ICD-10-CM | POA: Insufficient documentation

## 2017-05-11 DIAGNOSIS — R748 Abnormal levels of other serum enzymes: Secondary | ICD-10-CM | POA: Insufficient documentation

## 2017-05-11 DIAGNOSIS — A150 Tuberculosis of lung: Secondary | ICD-10-CM

## 2017-05-11 DIAGNOSIS — R197 Diarrhea, unspecified: Secondary | ICD-10-CM

## 2017-05-11 DIAGNOSIS — Z79891 Long term (current) use of opiate analgesic: Secondary | ICD-10-CM | POA: Diagnosis not present

## 2017-05-11 LAB — URINALYSIS, COMPLETE (UACMP) WITH MICROSCOPIC
Bilirubin Urine: NEGATIVE
GLUCOSE, UA: NEGATIVE mg/dL
Hgb urine dipstick: NEGATIVE
Ketones, ur: NEGATIVE mg/dL
Nitrite: NEGATIVE
PH: 5 (ref 5.0–8.0)
PROTEIN: NEGATIVE mg/dL
SPECIFIC GRAVITY, URINE: 1.017 (ref 1.005–1.030)
Squamous Epithelial / LPF: NONE SEEN (ref 0–5)

## 2017-05-11 LAB — CBC WITH DIFFERENTIAL/PLATELET
BASOS ABS: 0.1 10*3/uL (ref 0–0.1)
BASOS PCT: 1 %
EOS ABS: 0.2 10*3/uL (ref 0–0.7)
Eosinophils Relative: 2 %
HCT: 25 % — ABNORMAL LOW (ref 35.0–47.0)
Hemoglobin: 8 g/dL — ABNORMAL LOW (ref 12.0–16.0)
Lymphocytes Relative: 17 %
Lymphs Abs: 1.5 10*3/uL (ref 1.0–3.6)
MCH: 25 pg — ABNORMAL LOW (ref 26.0–34.0)
MCHC: 32.1 g/dL (ref 32.0–36.0)
MCV: 77.7 fL — ABNORMAL LOW (ref 80.0–100.0)
MONO ABS: 0.8 10*3/uL (ref 0.2–0.9)
MONOS PCT: 9 %
Neutro Abs: 6.3 10*3/uL (ref 1.4–6.5)
Neutrophils Relative %: 71 %
PLATELETS: 381 10*3/uL (ref 150–440)
RBC: 3.22 MIL/uL — ABNORMAL LOW (ref 3.80–5.20)
RDW: 15.9 % — AB (ref 11.5–14.5)
WBC: 8.8 10*3/uL (ref 3.6–11.0)

## 2017-05-11 LAB — BASIC METABOLIC PANEL
Anion gap: 6 (ref 5–15)
BUN: 42 mg/dL — ABNORMAL HIGH (ref 6–20)
CALCIUM: 8.7 mg/dL — AB (ref 8.9–10.3)
CO2: 25 mmol/L (ref 22–32)
Chloride: 104 mmol/L (ref 101–111)
Creatinine, Ser: 1.49 mg/dL — ABNORMAL HIGH (ref 0.44–1.00)
GFR, EST AFRICAN AMERICAN: 32 mL/min — AB (ref >60)
GFR, EST NON AFRICAN AMERICAN: 27 mL/min — AB (ref >60)
Glucose, Bld: 139 mg/dL — ABNORMAL HIGH (ref 65–99)
Potassium: 4.3 mmol/L (ref 3.5–5.1)
SODIUM: 135 mmol/L (ref 135–145)

## 2017-05-11 LAB — CK: CK TOTAL: 102 U/L (ref 38–234)

## 2017-05-11 LAB — TROPONIN I: Troponin I: 0.05 ng/mL (ref <0.03)

## 2017-05-11 NOTE — ED Provider Notes (Signed)
[facility] Emergency Department Provider Note  ____________________________________________  Time seen: Approximately 8:41 PM  I have reviewed the triage vital signs and the nursing notes.   HISTORY  Chief Complaint Weakness and Stroke Symptoms  Level 5 caveat:  Portions of the history and physical were unable to be obtained due to dementia   HPI Sandra Larson is a 84 y.o. female with a history of CVA with residual LUE weakness, atrial fibrillation on Pradaxa, HTN who presents from home via EMS for right-sided weakness.  According to patient's daughter, she checked on her this morning at 8:30AM and daughter noticed that patient was leaning heavily on her walker to ambulate.  This evening when the family came back to check on her they noted that patient was unable to walk and was leaning towards the right side.  Patient tells me that she is unable to walk.  She cannot explain why.  She does not know if her legs are weak or numb but she does complain of pain in her right leg.  She reports that she has had pain in her right leg for several days but today the pain became worse and she had been unable to stand up and walk.  She denies any recent falls.  She denies slurred speech or facial droop, she denies difficulty finding words.  She denies feeling weak on the right side.   Past Medical History:  Diagnosis Date  . Glaucoma   . Hypertension     Patient Active Problem List   Diagnosis Date Noted  . 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 Atrial fibrillation (HCC) 04/25/2012  . CVA (cerebral infarction) 04/21/2012  . HTN (hypertension) 04/21/2012    Past Surgical History:  Procedure Laterality Date  . APPENDECTOMY    . CATARACT EXTRACTION    . ESOPHAGOGASTRODUODENOSCOPY (EGD) WITH ESOPHAGEAL DILATION    . TEE WITHOUT CARDIOVERSION N/A 04/24/2012   Procedure: TRANSESOPHAGEAL ECHOCARDIOGRAM (TEE);  Surgeon: Thurmon FairMihai Croitoru, MD;  [facility];   Service: Cardiovascular;  Laterality: N/A;    Prior to Admission medications   Medication Sig Start Date End Date Taking? Authorizing Provider  traMADol (ULTRAM) 50 MG tablet Take 50 mg by mouth daily as needed for pain.   Yes [provider]  valsartan-hydrochlorothiazide (DIOVAN-HCT) 320-12.5 MG tablet Take 0.5 tablets by mouth every morning.   Yes [provider]  XARELTO 15 MG TABS tablet Take 15 mg by mouth daily.   Yes [provider]  acetaminophen (TYLENOL) 325 MG tablet Take 1-2 tablets (325-650 mg total) by mouth every 4 (four) hours as needed. 05/04/12   Love, Evlyn KannerPamela S, PA-C  dabigatran (PRADAXA) 75 MG CAPS Take 1 capsule (75 mg total) by mouth every 12 (twelve) hours. Blood thinner 05/04/12   Love, Evlyn KannerPamela S, PA-C  diclofenac sodium (VOLTAREN) 1 % GEL Apply 2 g topically 4 (four) times daily. To right knee for arthritis 05/04/12   Love, Evlyn KannerPamela S, PA-C  dorzolamide-timolol (COSOPT) 22.3-6.8 MG/ML ophthalmic solution Place 1 drop into both eyes 2 (two) times daily.    [provider]  irbesartan (AVAPRO) 300 MG tablet Take 1 tablet (300 mg total) by mouth daily. For blood pressure. 05/04/12   Love, Evlyn KannerPamela S, PA-C  latanoprost (XALATAN) 0.005 % ophthalmic solution Place 1 drop into both eyes at bedtime.    [provider]  Multiple Vitamin (MULTIVITAMIN WITH MINERALS) TABS Take 1 tablet by mouth daily.    [provider]  pantoprazole (PROTONIX)  40 MG tablet Take 1 tablet (40 mg total) by mouth daily. To protect stomach 05/04/12   Love, Evlyn Kanner, PA-C  senna-docusate (SENOKOT-S) 8.6-50 MG per tablet Take 1 tablet by mouth at bedtime. For constipation. 05/04/12   Love, Evlyn Kanner, PA-C  simvastatin (ZOCOR) 20 MG tablet Take 1 tablet (20 mg total) by mouth daily at 6 PM. For elevated lipids. 05/04/12   Love, Evlyn Kanner, PA-C    Allergies Penicillins and Sulfa antibiotics  Family History  Problem Relation Age of Onset  . Stroke Mother   .  Stroke Father     Social History Social History   Tobacco Use  . Smoking status: Never Smoker  . Smokeless tobacco: Never Used  Substance Use Topics  . Alcohol use: No  . Drug use: No    Review of Systems  Constitutional: Negative for fever. Eyes: Negative for visual changes. ENT: Negative for sore throat. Neck: No neck pain  Cardiovascular: Negative for chest pain. Respiratory: Negative for shortness of breath. Gastrointestinal: Negative for abdominal pain, vomiting or diarrhea. Genitourinary: Negative for dysuria. Musculoskeletal: Negative for back pain. + RLE pain Skin: Negative for rash. Neurological: Negative for headaches. + leaning to the right, unable to walk Psych: No SI or HI  ____________________________________________   PHYSICAL EXAM:  VITAL SIGNS: ED Triage Vitals  Enc Vitals Group     BP 05/11/17 1949 129/60     Pulse Rate 05/11/17 1949 89     Resp 05/11/17 1949 20     Temp 05/11/17 1949 98.8 F (37.1 C)     Temp src --      SpO2 05/11/17 1949 100 %     Weight 05/11/17 2000 109 lb (49.4 kg)     Height 05/11/17 2000 5\' 5"  (1.651 m)     Head Circumference --      Peak Flow --      Pain Score --      Pain Loc --      Pain Edu? --      Excl. in GC? --     Constitutional: Alert and oriented x 2. Well appearing and in no apparent distress. HEENT:      Head: Normocephalic and atraumatic.         Eyes: Conjunctivae are normal. Sclera is non-icteric.       Mouth/Throat: Mucous membranes are moist.       Neck: Supple with no signs of meningismus. Cardiovascular: Regular rate and rhythm. No murmurs, gallops, or rubs. 2+ symmetrical distal pulses are present in all extremities. No JVD. Respiratory: Normal respiratory effort. Lungs are clear to auscultation bilaterally. No wheezes, crackles, or rhonchi.  Gastrointestinal: Soft, non tender, and non distended with positive bowel sounds. No rebound or guarding. Musculoskeletal: b/l LE appear normal but  patient complains of diffuse pain to palpation of the entire R leg. 1+ pitting edema b/l Neurologic: Normal speech and language. Patient is leaning to the right, 4/5 strength on the LUE, 4/5 on b/l LE, 5/5 RUE, no pronator drift or dysmetria, normal speech, CN II-XII WNL Skin: Skin is warm, dry and intact. No rash noted. Psychiatric: Mood and affect are normal. Speech and behavior are normal.  ____________________________________________   LABS (all labs ordered are listed, but only abnormal results are displayed)  Labs Reviewed  CBC WITH DIFFERENTIAL/PLATELET - Abnormal; Notable for the following components:      Result Value   RBC 3.22 (*)    Hemoglobin 8.0 (*)  HCT 25.0 (*)    MCV 77.7 (*)    MCH 25.0 (*)    RDW 15.9 (*)    All other components within normal limits  BASIC METABOLIC PANEL - Abnormal; Notable for the following components:   Glucose, Bld 139 (*)    BUN 42 (*)    Creatinine, Ser 1.49 (*)    Calcium 8.7 (*)    GFR calc non Af Amer 27 (*)    GFR calc Af Amer 32 (*)    All other components within normal limits  URINALYSIS, COMPLETE (UACMP) WITH MICROSCOPIC - Abnormal; Notable for the following components:   Color, Urine YELLOW (*)    APPearance CLEAR (*)    Leukocytes, UA SMALL (*)    Bacteria, UA RARE (*)    All other components within normal limits  TROPONIN I - Abnormal; Notable for the following components:   Troponin I 0.05 (*)    All other components within normal limits  URINE CULTURE  CK   ____________________________________________  EKG  ED ECG REPORT I, Nita Sickle, the attending physician, personally viewed and interpreted this ECG.  Sinus rhythm with marked sinus arrhythmia, rate of 81, right bundle branch block, normal QTC, normal axis, T wave inversion in V3 and V4 and inferior leads.  No ST elevations or depressions.  Mostly unchanged from prior ____________________________________________  RADIOLOGY  I have personally reviewed  the images performed during this visit and I agree with the Radiologist's read.   Interpretation by Radiologist:  Ct Head Wo Contrast  Result Date: 05/11/2017 CLINICAL DATA:  Stroke-like symptoms with right-sided facial droop and leaning to the right. EXAM: CT HEAD WITHOUT CONTRAST TECHNIQUE: Contiguous axial images were obtained from the base of the skull through the vertex without intravenous contrast. COMPARISON:  CT and MR exams from 04/21/2012 FINDINGS: Brain: Remote right posterior parietal lobe infarct with mild ex vacuo dilatation of the adjacent right lateral ventricle. Chronic small vessel ischemic disease noted of the periventricular white matter. No acute intracranial hemorrhage, midline shift or edema. No extra-axial fluid collections. Vascular: Atherosclerosis of the carotid siphons bilaterally. Hyperdense vessel sign. Skull: Negative for fracture or focal lesion. Sinuses/Orbits: No acute finding. Other: None IMPRESSION: Remote right posterior parietal lobe infarct with mild ex vacuo dilatation of the adjacent right lateral ventricle and mild encephalomalacia. No acute intracranial abnormality. Electronically Signed   By: Tollie Eth M.D.   On: 05/11/2017 21:40   US Venous Img Lower Bilateral  Result Date: 05/11/2017 CLINICAL DATA:  Bilateral lower extremity pain and difficulty walking today. EXAM: BILATERAL LOWER EXTREMITY VENOUS DOPPLER ULTRASOUND TECHNIQUE: Gray-scale sonography with graded compression, as well as color Doppler and duplex ultrasound were performed to evaluate the lower extremity deep venous systems from the level of the common femoral vein and including the common femoral, femoral, profunda femoral, popliteal and calf veins including the posterior tibial, peroneal and gastrocnemius veins when visible. The superficial great saphenous vein was also interrogated. Spectral Doppler was utilized to evaluate flow at rest and with distal augmentation maneuvers in the common  femoral, femoral and popliteal veins. COMPARISON:  None. FINDINGS: RIGHT LOWER EXTREMITY Common Femoral Vein: No evidence of thrombus. Normal compressibility, respiratory phasicity and response to augmentation. Saphenofemoral Junction: No evidence of thrombus. Normal compressibility and flow on color Doppler imaging. Profunda Femoral Vein: No evidence of thrombus. Normal compressibility and flow on color Doppler imaging. Femoral Vein: No evidence of thrombus. Normal compressibility, respiratory phasicity and response to augmentation. Popliteal Vein: No evidence  of thrombus. Normal compressibility, respiratory phasicity and response to augmentation. Calf Veins: No evidence of thrombus. Mild tracking subcutaneous soft tissue edema. Normal compressibility and flow on color Doppler imaging. Superficial Great Saphenous Vein: No evidence of thrombus. Normal compressibility. Venous Reflux:  None. Other Findings:  None. LEFT LOWER EXTREMITY Common Femoral Vein: No evidence of thrombus. Normal compressibility, respiratory phasicity and response to augmentation. Saphenofemoral Junction: No evidence of thrombus. Normal compressibility and flow on color Doppler imaging. Profunda Femoral Vein: No evidence of thrombus. Normal compressibility and flow on color Doppler imaging. Femoral Vein: No evidence of thrombus. Normal compressibility, respiratory phasicity and response to augmentation. Popliteal Vein: No evidence of thrombus. Normal compressibility, respiratory phasicity and response to augmentation. Calf Veins: Limited assessment due to subcutaneous soft tissue edema in the calf. Superficial Great Saphenous Vein: No evidence of thrombus. Normal compressibility. Venous Reflux:  None. Other Findings:  None. IMPRESSION: No evidence of deep venous thrombosis. Subcutaneous soft tissue edema involving the left calf limits assessment of the calf veins. Electronically Signed   By: Tollie Eth M.D.   On: 05/11/2017 22:14        ____________________________________________   PROCEDURES  Procedure(s) performed: None Procedures Critical Care performed:  None ____________________________________________   INITIAL IMPRESSION / ASSESSMENT AND PLAN / ED COURSE  84 y.o. female with a history of CVA with residual LUE weakness, atrial fibrillation on Pradaxa, HTN who presents from home via EMS for right-sided weakness.  Last seen normal yesterday by family.  Patient does seem to be leaning towards the right but does not really have any focal neurological deficits otherwise on exam.  She has significant tenderness to palpation on the entire right lower extremity which is warm and well perfused with strong pulses, no crepitus, no ulcerations, no cellulitis, no deformities, no edema.  Will send patient for CT head, Dopplers of the lower extremities.  Will check basic labs including CK and urinalysis.    _________________________ 10:29 PM on 05/11/2017 -----------------------------------------  Doppler ultrasound negative for DVT.  CT head with no acute findings.  Labs show hemoglobin of 8 which is a drop from patient's baseline of 9.7 (on 02/2016). According to the daughter yesterday they found patient and her house covered in black foul smelling stool. The walls, floors, and furniture were all covered in stool. Rectal exam shows brown stool guaiac negative. UA with rare bacteria and small leuks, will hold treatment until culture is back. Troponin is slightly elevated at 0.05. Will order MRI to rule out CVA and admit to Hospitalist   As part of my medical decision making, I reviewed the following data within the electronic MEDICAL RECORD NUMBER History obtained from family, Nursing notes reviewed and incorporated, Labs reviewed , EKG interpreted , Old chart reviewed, Radiograph reviewed , Discussed with admitting physician , Notes from prior ED visits and [place] Controlled Substance Database    Pertinent labs & imaging results that  were available during my care of the patient were reviewed by me and considered in my medical decision making (see chart for details).    ____________________________________________   FINAL CLINICAL IMPRESSION(S) / ED DIAGNOSES  Final diagnoses:  Diarrhea, unspecified type  Anemia, unspecified type  Right sided weakness  Elevated troponin      NEW MEDICATIONS STARTED DURING THIS VISIT:  ED Discharge Orders    None       Note:  This document was prepared using Dragon voice recognition software and may include unintentional dictation errors.    Don Perking,  [place], MD 05/11/17 2233

## 2017-05-11 NOTE — ED Triage Notes (Signed)
Pt arrived via EMS from home with complaints of stroke-like symptoms by the daughter. EMS stated that the daughter stated that for the past two days the pt has had right sided facial droop and leaning towards her right side. Last seen normal was on Monday morning. Pt has Hx of stroke with left sided deficits. Pt has not been able to walk today, normally she can. Pt is at her baseline which is oriented to person and place. VS per EMS BP-134/65 HR-90 O2sat-98%RA CBG-167. EMS placed a 20 in left AC. Dr. Don PerkingVeronese at bedside.

## 2017-05-12 ENCOUNTER — Other Ambulatory Visit: Payer: Self-pay

## 2017-05-12 ENCOUNTER — Observation Stay: Payer: Medicare Other

## 2017-05-12 DIAGNOSIS — R531 Weakness: Secondary | ICD-10-CM | POA: Diagnosis not present

## 2017-05-12 DIAGNOSIS — E43 Unspecified severe protein-calorie malnutrition: Secondary | ICD-10-CM

## 2017-05-12 LAB — BASIC METABOLIC PANEL
Anion gap: 6 (ref 5–15)
BUN: 35 mg/dL — AB (ref 6–20)
CO2: 27 mmol/L (ref 22–32)
CREATININE: 1.1 mg/dL — AB (ref 0.44–1.00)
Calcium: 8.9 mg/dL (ref 8.9–10.3)
Chloride: 105 mmol/L (ref 101–111)
GFR calc Af Amer: 46 mL/min — ABNORMAL LOW (ref >60)
GFR, EST NON AFRICAN AMERICAN: 40 mL/min — AB (ref >60)
GLUCOSE: 95 mg/dL (ref 65–99)
Potassium: 4 mmol/L (ref 3.5–5.1)
SODIUM: 138 mmol/L (ref 135–145)

## 2017-05-12 LAB — CBC
HCT: 26.4 % — ABNORMAL LOW (ref 35.0–47.0)
Hemoglobin: 8.4 g/dL — ABNORMAL LOW (ref 12.0–16.0)
MCH: 24.7 pg — ABNORMAL LOW (ref 26.0–34.0)
MCHC: 31.6 g/dL — AB (ref 32.0–36.0)
MCV: 78 fL — ABNORMAL LOW (ref 80.0–100.0)
PLATELETS: 425 10*3/uL (ref 150–440)
RBC: 3.39 MIL/uL — ABNORMAL LOW (ref 3.80–5.20)
RDW: 15.9 % — AB (ref 11.5–14.5)
WBC: 7 10*3/uL (ref 3.6–11.0)

## 2017-05-12 LAB — MAGNESIUM: Magnesium: 1.6 mg/dL — ABNORMAL LOW (ref 1.7–2.4)

## 2017-05-12 LAB — NA AND K (SODIUM & POTASSIUM), RAND UR
Potassium Urine: 53 mmol/L
Sodium, Ur: 43 mmol/L

## 2017-05-12 LAB — PREALBUMIN: PREALBUMIN: 21.4 mg/dL (ref 18–38)

## 2017-05-12 LAB — CREATININE, URINE, RANDOM: CREATININE, URINE: 107 mg/dL

## 2017-05-12 LAB — IRON AND TIBC
Iron: 19 ug/dL — ABNORMAL LOW (ref 28–170)
SATURATION RATIOS: 6 % — AB (ref 10.4–31.8)
TIBC: 347 ug/dL (ref 250–450)
UIBC: 328 ug/dL

## 2017-05-12 LAB — TRANSFERRIN: TRANSFERRIN: 241 mg/dL (ref 192–382)

## 2017-05-12 LAB — RETICULOCYTES
RBC.: 3.2 MIL/uL — ABNORMAL LOW (ref 3.80–5.20)
Retic Count, Absolute: 28.8 10*3/uL (ref 19.0–183.0)
Retic Ct Pct: 0.9 % (ref 0.4–3.1)

## 2017-05-12 LAB — FERRITIN: FERRITIN: 7 ng/mL — AB (ref 11–307)

## 2017-05-12 LAB — PROTEIN, URINE, RANDOM: TOTAL PROTEIN, URINE: 13 mg/dL

## 2017-05-12 LAB — PHOSPHORUS: PHOSPHORUS: 3.5 mg/dL (ref 2.5–4.6)

## 2017-05-12 MED ORDER — VALSARTAN-HYDROCHLOROTHIAZIDE 320-12.5 MG PO TABS: 0.5000 | ORAL_TABLET | ORAL | Status: DC

## 2017-05-12 MED ORDER — TRAMADOL HCL 50 MG PO TABS: 50.0000 mg | ORAL_TABLET | Freq: Two times a day (BID) | ORAL | Status: DC | PRN

## 2017-05-12 MED ORDER — HYDROCHLOROTHIAZIDE 12.5 MG PO CAPS
12.5000 mg | ORAL_CAPSULE | ORAL | Status: DC
  Administered 2017-05-12: 12.5 mg via ORAL
  Filled 2017-05-12: qty 1

## 2017-05-12 MED ORDER — ACETAMINOPHEN 325 MG PO TABS: 650.0000 mg | ORAL_TABLET | Freq: Four times a day (QID) | ORAL | Status: DC | PRN

## 2017-05-12 MED ORDER — ONDANSETRON HCL 4 MG/2ML IJ SOLN: 4.0000 mg | Freq: Four times a day (QID) | INTRAMUSCULAR | Status: DC | PRN

## 2017-05-12 MED ORDER — RIVAROXABAN 15 MG PO TABS: 15.0000 mg | ORAL_TABLET | Freq: Every day | ORAL | Status: DC

## 2017-05-12 MED ORDER — SIMVASTATIN 20 MG PO TABS: 20.0000 mg | ORAL_TABLET | Freq: Every day | ORAL | Status: DC

## 2017-05-12 MED ORDER — ADULT MULTIVITAMIN W/MINERALS CH
1.0000 | ORAL_TABLET | Freq: Every day | ORAL | Status: DC
  Administered 2017-05-12: 1 via ORAL
  Filled 2017-05-12: qty 1

## 2017-05-12 MED ORDER — LATANOPROST 0.005 % OP SOLN
1.0000 [drp] | Freq: Every day | OPHTHALMIC | Status: DC
  Filled 2017-05-12: qty 2.5

## 2017-05-12 MED ORDER — SENNOSIDES-DOCUSATE SODIUM 8.6-50 MG PO TABS: 1.0000 | ORAL_TABLET | Freq: Every evening | ORAL | Status: DC | PRN

## 2017-05-12 MED ORDER — DORZOLAMIDE HCL-TIMOLOL MAL 2-0.5 % OP SOLN
1.0000 [drp] | Freq: Two times a day (BID) | OPHTHALMIC | Status: DC
  Administered 2017-05-12: 1 [drp] via OPHTHALMIC
  Filled 2017-05-12: qty 10

## 2017-05-12 MED ORDER — IRBESARTAN 150 MG PO TABS
300.0000 mg | ORAL_TABLET | ORAL | Status: DC
  Administered 2017-05-12: 300 mg via ORAL
  Filled 2017-05-12: qty 2

## 2017-05-12 MED ORDER — LOSARTAN POTASSIUM 100 MG PO TABS: 100.0000 mg | ORAL_TABLET | Freq: Every day | ORAL | 11 refills | Status: DC

## 2017-05-12 MED ORDER — ONDANSETRON HCL 4 MG PO TABS: 4.0000 mg | ORAL_TABLET | Freq: Four times a day (QID) | ORAL | Status: DC | PRN

## 2017-05-12 MED ORDER — APIXABAN 2.5 MG PO TABS
2.5000 mg | ORAL_TABLET | Freq: Two times a day (BID) | ORAL | Status: DC
  Administered 2017-05-12: 2.5 mg via ORAL
  Filled 2017-05-12: qty 1

## 2017-05-12 MED ORDER — BISACODYL 5 MG PO TBEC: 5.0000 mg | DELAYED_RELEASE_TABLET | Freq: Every day | ORAL | Status: DC | PRN

## 2017-05-12 MED ORDER — LACTATED RINGERS IV SOLN
INTRAVENOUS | Status: DC
  Administered 2017-05-12: 01:00:00 via INTRAVENOUS

## 2017-05-12 MED ORDER — TUBERCULIN PPD 5 UNIT/0.1ML ID SOLN
5.0000 [IU] | Freq: Once | INTRADERMAL | Status: DC
  Administered 2017-05-12: 5 [IU] via INTRADERMAL
  Filled 2017-05-12: qty 0.1

## 2017-05-12 MED ORDER — ACETAMINOPHEN 650 MG RE SUPP: 650.0000 mg | Freq: Four times a day (QID) | RECTAL | Status: DC | PRN

## 2017-05-12 MED ORDER — ENSURE ENLIVE PO LIQD: 237.0000 mL | Freq: Two times a day (BID) | ORAL | Status: DC

## 2017-05-12 NOTE — Progress Notes (Signed)
Initial Nutrition Assessment  DOCUMENTATION CODES:   Severe malnutrition in context of chronic illness  INTERVENTION:   Magic cup TID with meals, each supplement provides 290 kcal and 9 grams of protein  Vital Cuisine TID, each supplement provides 520kcal and 22g of protein.   Liberalize diet   MVI daily  Recommend check B12 lab   NUTRITION DIAGNOSIS:   Severe Malnutrition related to chronic illness(chronic poor appetite, advanced age ) as evidenced by severe fat depletion, severe muscle depletion.  GOAL:   Patient will meet greater than or equal to 90% of their needs  MONITOR:   PO intake, Supplement acceptance, Labs, Weight trends, Skin, I & O's  REASON FOR ASSESSMENT:   Consult Assessment of nutrition requirement/status  ASSESSMENT:   [age] y.o. female with a known history of HTN, HLD, Hx R MCA CVA (w/ residual LUE weakness), chronic Afib (Xarelto), CKD III, GERD, glaucoma, macular degeneration who p/w reported weakness.    Visited pt's room today. Pt is a poor historian so history obtain from pt's daughter at bedside. Daughter reports pt with poor appetite and oral intake at baseline. Pt mainly likes bread and meat such as biscuits and sandwiches. Pt is unable to tolerate Ensure/Boost as they give her diarrhea. Per chart, pt with 7lb weight loss since August; family is unsure of the time frame in which pt lost the weight. Per daughter, pt does not have any trouble chewing or swallowing. RD will order supplements to help pt meet her estimated needs. RD will liberalize diet to encourage intake. Pt noted to have iron deficiency anemia; recommend check B12 labs. Pt to possibly discharge to SNF today.     Medications reviewed and include: MVI  Labs reviewed: BUN 35(H), creat 1.10(H) P 3.5 wnl, Mg 1.6(L)- 4/24 Prealbumin- 21.4 Iron 19(L), ferritin 7(L), TIBC 347 Hgb 8.4(L), Hct 26.4(L), MCV 78(H), MCH 24.7(L), MCHC 31.6(L)  Diet Order:  Diet regular Room service  appropriate? Yes; Fluid consistency: Thin  EDUCATION NEEDS:   No education needs have been identified at this time  Skin:  Skin Assessment: Reviewed RN Assessment  Last BM:  4/24  Height:   Ht Readings from Last 1 Encounters:  05/12/17 5\' 2"  (1.575 m)    Weight:   Wt Readings from Last 1 Encounters:  05/12/17 89 lb 11.2 oz (40.7 kg)    Ideal Body Weight:  50 kg  BMI:  Body mass index is 16.41 kg/m.  Estimated Nutritional Needs:   Kcal:  1000-1200kcal/day   Protein:  60-69g/day   Fluid:  >1L/day   Betsey Holidayasey Torben Soloway MS, RD, LDN Pager #[phone] After Hours Pager: [phone]

## 2017-05-12 NOTE — ED Notes (Signed)
Admitting MD at bedside.

## 2017-05-12 NOTE — Care Management Note (Signed)
Case Management Note  Patient Details  Name: Lara MulchDaile S Hasz MRN: [id number] Date of Birth: [birth year]  Subjective/Objective:    Received call from PrescottEric, CSW that patient will be discharging to [facility] today. Will need HHPT. They prefer [facility]. Referral to Ileene RubensKim Fisher with [facility].                  Action/Plan:   Expected Discharge Date:                  Expected Discharge Plan:  Home w Home Health Services  In-House Referral:     Discharge planning Services  CM Consult  Post Acute Care Choice:  Home Health Choice offered to:  Adult Children  DME Arranged:    DME Agency:     HH Arranged:  PT HH Agency:  [facility]  Status of Service:  Completed, signed off  If discussed at Long Length of Stay Meetings, dates discussed:    Additional Comments:  Marily MemosLisa M Jayvan Mcshan, RN 05/12/2017, 3:12 PM

## 2017-05-12 NOTE — Care Management (Signed)
Spoke with daughter, Salvatore MarvelSusan Green. After discussing the MOON. She states that she and her family would like patient placed at [facility]. They are aware they will have to private pay and are willing to. Will update CSW. They state they can no longer handle patient at home and patient is not safe at home.

## 2017-05-12 NOTE — Progress Notes (Signed)
Went over discharge instructions with the patient's family including medication and follow-up appointment. Called report and talked to Cendant CorporationKathryn RN. Discontinue peripheral IV and telemetry monitor. Family to take patient to assisted living. [facility]

## 2017-05-12 NOTE — Progress Notes (Signed)
Patient admitted for weakness and stroke-like symptoms w/ a h/o afib. CT/MRI negative. Patient is on xarelto 15 mg daily PTA; however, patient is also in ARF w/ a CrCl 12.6 ml/min.  Rivaroxaban is contraindicated for this patient population; however, spoke to hospitalist regarding the necessity of anticoagulation and recommended to transition to eliquis 2.5 mg bid considering patient meets 2/3 adjustment criteria (> 82 years, < 60 kg, Scr < 1.5 (but in ARF)).  Will switch patient to apixaban 2.5 mg BID, MD agrees with plan.  Sandra Larson, PharmD, BCPS Clinical Pharmacist 05/12/2017

## 2017-05-12 NOTE — Discharge Summary (Signed)
[facility] [place] [facility]   PATIENT NAME: Sandra Larson    MR#:  [id number]  DATE OF BIRTH:  [birth year]  DATE OF ADMISSION:  05/11/2017 ADMITTING PHYSICIAN: Cammy Copa, MD  DATE OF DISCHARGE: 05/12/2017   PRIMARY CARE PHYSICIAN: Olive Bass, MD    ADMISSION DIAGNOSIS:  Elevated troponin [R74.8] Right sided weakness [R53.1] Anemia, unspecified type [D64.9] Diarrhea, unspecified type [R19.7]  DISCHARGE DIAGNOSIS:  Active Problems:   Weakness   SECONDARY DIAGNOSIS:   Past Medical History:  Diagnosis Date  . Glaucoma   . Hypertension     HOSPITAL COURSE:   A/P: 101F weakness, dehydration/renal failure, microcytic anemia, troponin elevation.  1.) Weakness:   on full-dose therapeutic anticoagulation w/ NoAC. The patient does not report any new or progressive neurological deficits, and her neurological examination appears stable when compared to previous exams. CT and MRI imaging do not demonstrate any acute intracranial process.  likely this was combination of dehydration and old age.  2.) Dehydration/ac renal failure: Dry MM on examination. Cr 1.49 on admission. Pt w/ baseline CKD III (2/2 HTN, aged kidney), baseline Cr 1.0-1.2 (most recently 1.0 as of 04/22/2017). Does not technically meet criteria for AKI (Cr increase by 50% or 0.3). BUN/Cr ratio 42/1.49 = ~28.2 (> 20), dehydrated. Gentle IVF.  3.) Microcytic anemia: Hgb 8.0, MCV 77.7. Microcytic anemia. Guaiac (-). Pt likely w/ iron deficiency anemia, possibly driven by nutritional iron deficiency.  4.) Troponin elevation: Troponin marginally elevated at 0.05. Possibly in setting of strain, dehydration/CKD (decreased renal clearance). Denies CP/SOB. EKG w/ sinus arrhythmia, RBBB, wide QRS, no acute ST/TW ischemic changes. Low concern. C/w Xarelto, Statin, ARB.  5.) Underweight: Pt underweight, low BMI. Appears thin/emaciated.   6.) Hyperglycemia: Glucose 139. HbA1c 5.6 as  of 09/03/2016. Intervention unlikely to affect mortality. Monitor.  7.) HTN: c/w Valsartan. Hold HCTZ due to dehydration.  8.) HLD/CVA/AFib: c/w Xarelto, Statin, ARB.  9.) Glaucoma: c/w Cosopt, Xalatan.  10.) FEN/GI: Cardiac diet, IVF.  11.) DVT PPx: Full dose therapeutic anticoagulation w/ Xarelto, continue.  12.) Code Status: Full code for now. Daughter DPOA.  53.) Disposition: Family wanted her to go to AL[facility]ility- arranged with [facility]HA and PT there.Marland Kitchen   DISCHARGE CONDITIONS:   Stable.  CONSULTS OBTAINED:  Treatment Team:  Barbaraann Rondo, MD  DRUG ALLERGIES:   Allergies  Allergen Reactions  . Codeine Nausea And Vomiting  . Penicillins     chilhood  . Sulfa Antibiotics Rash    DISCHARGE MEDICATIONS:   Allergies as of 05/12/2017      Reactions   Codeine Nausea And Vomiting   Penicillins    chilhood   Sulfa Antibiotics Rash      Medication List    STOP taking these medications   dabigatran 75 MG Caps capsule Commonly known as:  PRADAXA   diclofenac sodium 1 % Gel Commonly known as:  VOLTAREN   irbesartan 300 MG tablet Commonly known as:  AVAPRO   senna-docusate 8.6-50 MG tablet Commonly known as:  Senokot-S   valsartan-hydrochlorothiazide 320-12.5 MG tablet Commonly known as:  DIOVAN-HCT     TAKE these medications   acetaminophen 325 MG tablet Commonly known as:  TYLENOL Take 1-2 tablets (325-650 mg total) by mouth every 4 (four) hours as needed.   dorzolamide-timolol 22.3-6.8 MG/ML ophthalmic solution Commonly known as:  COSOPT Place 1 drop into both eyes 2 (two) times daily.   latanoprost 0.005 % ophthalmic solution Commonly known as:  XALATAN Place 1  drop into both eyes at bedtime.   losartan 100 MG tablet Commonly known as:  COZAAR Take 1 tablet (100 mg total) by mouth daily.   multivitamin with minerals Tabs tablet Take 1 tablet by mouth daily.   simvastatin 20 MG tablet Commonly known as:  ZOCOR Take 1 tablet (20 mg  total) by mouth daily at 6 PM. For elevated lipids.   traMADol 50 MG tablet Commonly known as:  ULTRAM Take 50 mg by mouth daily as needed for pain.   XARELTO 15 MG Tabs tablet Generic drug:  Rivaroxaban Take 15 mg by mouth daily.        DISCHARGE INSTRUCTIONS:    Follow with PMD in 1-2 weeks.  If you experience worsening of your admission symptoms, develop shortness of breath, life threatening emergency, suicidal or homicidal thoughts you must seek medical attention immediately by calling 911 or calling your MD immediately  if symptoms less severe.  You Must read complete instructions/literature along with all the possible adverse reactions/side effects for all the Medicines you take and that have been prescribed to you. Take any new Medicines after you have completely understood and accept all the possible adverse reactions/side effects.   Please note  You were cared for by a hospitalist during your hospital stay. If you have any questions about your discharge medications or the care you received while you were in the hospital after you are discharged, you can call the unit and asked to speak with the hospitalist on call if the hospitalist that took care of you is not available. Once you are discharged, your primary care physician will handle any further medical issues. Please note that NO REFILLS for any discharge medications will be authorized once you are discharged, as it is imperative that you return to your primary care physician (or establish a relationship with a primary care physician if you do not have one) for your aftercare needs so that they can reassess your need for medications and monitor your lab values.    Today   CHIEF COMPLAINT:   Chief Complaint  Patient presents with  . Weakness  . Stroke Symptoms    HISTORY OF PRESENT ILLNESS:  Sandra Larson  is a 84 y.o. female with a known history of HTN, HLD, Hx R MCA CVA (w/ residual LUE weakness), chronic Afib  (Xarelto), CKD III, GERD, glaucoma, macular degeneration who p/w reported weakness. The pt is a poor historian, and provides a vague, meandering narrative lacking in cohesion. The pt tells me that she lives at home by herself, on a single floor, and ambulates with a walker at baseline. She states that her daughter (DPOA) lives several houses down from her, and between her daughter and her son-in-law, they check up on her 5-6x+ per day, including to set up her meals. She is mildly hard of hearing, and appears to have very poor vision at baseline. She has had B/L cataract extraction, and also has Hx of glaucoma and macular degeneration. It seems she is dependent for some ADLs. She states she hopes to go home on discharge.  Pt states that she fell late last week (she believes it occurred Thursday 05/05/2017). She states she was in the bathroom, and "the walker went up in the air". She states she hit her head against the heater, "hard enough to turn the heater on", but she denies sustaining any major injuries, and is pain-free at the time of my Hx/examination. She states this was the only time she  has fallen, and denies other falls or frequent falls. She states that she used her LifeAlert, and EMS came to help her up, but she did not opt for hospitalization at the time. She states that she has generally been feeling well since that time, including on the date of admission. She states, however, that her family checked in on her in the morning (Wednesday 05/11/2017) and thought that she was leaning towards one side and was not walking properly. They apparently felt that she, "didn't look OK," per the pt report. The pt states she is unsteady at baseline, hence why she uses the walker, and has been unsteady since her prior stroke, but she denies any weakness, dysequilibrium or new/worsening neurological symptoms. She denies N/V, HA, vertigo, slurred speech, acute changes in hearing/vision. Pt states EMS was called and  she was brought to the hospital via ambulance. Pt denies F/C, diarrhea, AP, CP/SOB, palpitations, diaphoresis, rigors, night sweats, LH/LOC, urinary symptoms. She endorses fecal incontinence, chronic x3-90mo, but denies frank red blood in the stools. She is thin/underweight and appears dehydrated, but endorses good PO intake, and is otherwise without complaint.   VITAL SIGNS:  Blood pressure (!) 142/64, pulse 81, temperature 98.2 F (36.8 C), resp. rate 18, height 5\' 2"  (1.575 m), weight 40.7 kg (89 lb 11.2 oz), SpO2 100 %.  I/O:    Intake/Output Summary (Last 24 hours) at 05/12/2017 1530 Last data filed at 05/12/2017 0700 Gross per 24 hour  Intake 555 ml  Output 550 ml  Net 5 ml    PHYSICAL EXAMINATION:  GENERAL:  84 y.o.-year-old thin patient lying in the bed with no acute distress.  EYES: Pupils equal, round, reactive to light and accommodation. No scleral icterus. Extraocular muscles intact.  HEENT: Head atraumatic, normocephalic. Oropharynx and nasopharynx clear.  NECK:  Supple, no jugular venous distention. No thyroid enlargement, no tenderness.  LUNGS: Normal breath sounds bilaterally, no wheezing, rales,rhonchi or crepitation. No use of accessory muscles of respiration.  CARDIOVASCULAR: S1, S2 normal. No murmurs, rubs, or gallops.  ABDOMEN: Soft, non-tender, non-distended. Bowel sounds present. No organomegaly or mass.  EXTREMITIES: No pedal edema, cyanosis, or clubbing.  NEUROLOGIC: Cranial nerves II through XII are intact. Muscle strength 3-4/5 in all extremities. Sensation intact. Gait not checked.  PSYCHIATRIC: The patient is alert and oriented x 2.  SKIN: No obvious rash, lesion, or ulcer.   DATA REVIEW:   CBC Recent Labs  Lab 05/12/17 0651  WBC 7.0  HGB 8.4*  HCT 26.4*  PLT 425    Chemistries  Recent Labs  Lab 05/11/17 2007 05/12/17 0651  NA 135 138  K 4.3 4.0  CL 104 105  CO2 25 27  GLUCOSE 139* 95  BUN 42* 35*  CREATININE 1.49* 1.10*  CALCIUM 8.7*  8.9  MG 1.6*  --     Cardiac Enzymes Recent Labs  Lab 05/11/17 2007  TROPONINI 0.05*    Microbiology Results  No results found for this or any previous visit.  RADIOLOGY:  Dg Chest 1 View  Result Date: 05/12/2017 CLINICAL DATA:  Assisted-living.  Clearance for TB. EXAM: CHEST  1 VIEW COMPARISON:  04/21/2012. FINDINGS: Mediastinum and hilar structures normal. Cardiomegaly with normal pulmonary vascularity. Mild left base subsegmental atelectasis. Mild left base pleural thickening most likely scarring. No prominent pleural effusion. No pneumothorax. Degenerative change thoracic spine. Scoliosis thoracic spine. IMPRESSION: Mild basilar atelectasis and pleuroparenchymal scarring. No acute abnormality. No evidence of TB. Electronically Signed   By: Maisie Fus  Register  On: 05/12/2017 12:45   Ct Head Wo Contrast  Result Date: 05/11/2017 CLINICAL DATA:  Stroke-like symptoms with right-sided facial droop and leaning to the right. EXAM: CT HEAD WITHOUT CONTRAST TECHNIQUE: Contiguous axial images were obtained from the base of the skull through the vertex without intravenous contrast. COMPARISON:  CT and MR exams from 04/21/2012 FINDINGS: Brain: Remote right posterior parietal lobe infarct with mild ex vacuo dilatation of the adjacent right lateral ventricle. Chronic small vessel ischemic disease noted of the periventricular white matter. No acute intracranial hemorrhage, midline shift or edema. No extra-axial fluid collections. Vascular: Atherosclerosis of the carotid siphons bilaterally. Hyperdense vessel sign. Skull: Negative for fracture or focal lesion. Sinuses/Orbits: No acute finding. Other: None IMPRESSION: Remote right posterior parietal lobe infarct with mild ex vacuo dilatation of the adjacent right lateral ventricle and mild encephalomalacia. No acute intracranial abnormality. Electronically Signed   By: Tollie Ethavid  Kwon M.D.   On: 05/11/2017 21:40   Mr Brain Wo Contrast  Result Date:  05/12/2017 CLINICAL DATA:  RIGHT-sided weakness, leaning toward the RIGHT. History of stroke, atrial fibrillation on anticoagulation, hypertension. EXAM: MRI HEAD WITHOUT CONTRAST TECHNIQUE: Multiplanar, multiecho pulse sequences of the brain and surrounding structures were obtained without intravenous contrast. COMPARISON:  CT HEAD May 11, 2017 and MRI of the head April 21, 2012. FINDINGS: INTRACRANIAL CONTENTS: No reduced diffusion to suggest acute ischemia. No susceptibility artifact to suggest hemorrhage. RIGHT frontoparietal and RIGHT occipital encephalomalacia, worse than prior MRI. Scattered supratentorial white matter FLAIR T2 hyperintensities exclusive of aforementioned abnormality compatible with mild chronic small vessel ischemic disease. No midline shift, mass effect or masses. No advanced parenchymal brain volume loss for age. No hydrocephalus. VASCULAR: Normal major intracranial vascular flow voids present at skull base. Stable 7 x 5 mm A-comm aneurysm. Dolichoectatic intracranial vessels seen with chronic hypertension. SKULL AND UPPER CERVICAL SPINE: No abnormal sellar expansion. No suspicious calvarial bone marrow signal. Craniocervical junction maintained. SINUSES/ORBITS: The mastoid air-cells and included paranasal sinuses are well-aerated.The included ocular globes and orbital contents are non-suspicious. OTHER: None. IMPRESSION: 1. No acute intracranial process. 2. Propagation of old RIGHT MCA territory and RIGHT posterior watershed territory infarcts. 3. Stable 5 x 7 mm A-comm aneurysm. Electronically Signed   By: Awilda Metroourtnay  Bloomer M.D.   On: 05/12/2017 00:14   [place] Venous Img Lower Bilateral  Result Date: 05/11/2017 CLINICAL DATA:  Bilateral lower extremity pain and difficulty walking today. EXAM: BILATERAL LOWER EXTREMITY VENOUS DOPPLER ULTRASOUND TECHNIQUE: Gray-scale sonography with graded compression, as well as color Doppler and duplex ultrasound were performed to evaluate the lower  extremity deep venous systems from the level of the common femoral vein and including the common femoral, femoral, profunda femoral, popliteal and calf veins including the posterior tibial, peroneal and gastrocnemius veins when visible. The superficial great saphenous vein was also interrogated. Spectral Doppler was utilized to evaluate flow at rest and with distal augmentation maneuvers in the common femoral, femoral and popliteal veins. COMPARISON:  None. FINDINGS: RIGHT LOWER EXTREMITY Common Femoral Vein: No evidence of thrombus. Normal compressibility, respiratory phasicity and response to augmentation. Saphenofemoral Junction: No evidence of thrombus. Normal compressibility and flow on color Doppler imaging. Profunda Femoral Vein: No evidence of thrombus. Normal compressibility and flow on color Doppler imaging. Femoral Vein: No evidence of thrombus. Normal compressibility, respiratory phasicity and response to augmentation. Popliteal Vein: No evidence of thrombus. Normal compressibility, respiratory phasicity and response to augmentation. Calf Veins: No evidence of thrombus. Mild tracking subcutaneous soft tissue edema. Normal compressibility and  flow on color Doppler imaging. Superficial Great Saphenous Vein: No evidence of thrombus. Normal compressibility. Venous Reflux:  None. Other Findings:  None. LEFT LOWER EXTREMITY Common Femoral Vein: No evidence of thrombus. Normal compressibility, respiratory phasicity and response to augmentation. Saphenofemoral Junction: No evidence of thrombus. Normal compressibility and flow on color Doppler imaging. Profunda Femoral Vein: No evidence of thrombus. Normal compressibility and flow on color Doppler imaging. Femoral Vein: No evidence of thrombus. Normal compressibility, respiratory phasicity and response to augmentation. Popliteal Vein: No evidence of thrombus. Normal compressibility, respiratory phasicity and response to augmentation. Calf Veins: Limited  assessment due to subcutaneous soft tissue edema in the calf. Superficial Great Saphenous Vein: No evidence of thrombus. Normal compressibility. Venous Reflux:  None. Other Findings:  None. IMPRESSION: No evidence of deep venous thrombosis. Subcutaneous soft tissue edema involving the left calf limits assessment of the calf veins. Electronically Signed   By: Tollie Eth M.D.   On: 05/11/2017 22:14    EKG:   Orders placed or performed during the hospital encounter of 05/11/17  . ED EKG  . ED EKG  . EKG 12-Lead  . EKG 12-Lead  . EKG 12-Lead  . EKG 12-Lead      Management plans discussed with the patient, family and they are in agreement.  CODE STATUS: DNR    Code Status Orders  (From admission, onward)        Start     Ordered   05/12/17 0053  Full code  Continuous     05/12/17 0052    Code Status History    Date Active Date Inactive Code Status Order ID Comments User Context   04/25/2012 1615 05/05/2012 1440 DNR [id number]  Lynnae Prude Inpatient   04/22/2012 0847 04/25/2012 1615 DNR [id number]  Pamella Pert, MD Inpatient    Advance Directive Documentation     Most Recent Value  Type of Advance Directive  Healthcare Power of Attorney, Living will  Pre-existing out of facility DNR order (yellow form or pink MOST form)  -  "MOST" Form in Place?  -      TOTAL TIME TAKING CARE OF THIS PATIENT: 35 minutes.    Altamese Dilling M.D on 05/12/2017 at 3:30 PM  Between 7am to 6pm - Pager - [phone]  After 6pm go to [email or web address] - password [email or web address]  [facility]  Office  [phone]  CC: Primary care physician; Olive Bass, MD   Note: This dictation was prepared with Dragon dictation along with smaller phrase technology. Any transcriptional errors that result from this process are unintentional.

## 2017-05-12 NOTE — H&P (Addendum)
[facility] - [place] at [facility]   PATIENT NAME: Sandra Larson    MR#:  [id number]  DATE OF BIRTH:  03-30-1913  DATE OF ADMISSION:  05/11/2017  PRIMARY CARE PHYSICIAN: Olive Bass, MD   REQUESTING/REFERRING PHYSICIAN: Nita Sickle, MD  CHIEF COMPLAINT:   Chief Complaint  Patient presents with  . Weakness  . Stroke Symptoms    HISTORY OF PRESENT ILLNESS:  Sandra Larson  is a 84 y.o. female with a known history of HTN, HLD, Hx R MCA CVA (w/ residual LUE weakness), chronic Afib (Xarelto), CKD III, GERD, glaucoma, macular degeneration who p/w reported weakness. The pt is a poor historian, and provides a vague, meandering narrative lacking in cohesion. The pt tells me that she lives at home by herself, on a single floor, and ambulates with a walker at baseline. She states that her daughter (DPOA) lives several houses down from her, and between her daughter and her son-in-law, they check up on her 5-6x+ per day, including to set up her meals. She is mildly hard of hearing, and appears to have very poor vision at baseline. She has had B/L cataract extraction, and also has Hx of glaucoma and macular degeneration. It seems she is dependent for some ADLs. She states she hopes to go home on discharge.  Pt states that she fell late last week (she believes it occurred Thursday 05/05/2017). She states she was in the bathroom, and "the walker went up in the air". She states she hit her head against the heater, "hard enough to turn the heater on", but she denies sustaining any major injuries, and is pain-free at the time of my Hx/examination. She states this was the only time she has fallen, and denies other falls or frequent falls. She states that she used her LifeAlert, and EMS came to help her up, but she did not opt for hospitalization at the time. She states that she has generally been feeling well since that time, including on the date of admission. She states,  however, that her family checked in on her in the morning (Wednesday 05/11/2017) and thought that she was leaning towards one side and was not walking properly. They apparently felt that she, "didn't look OK," per the pt report. The pt states she is unsteady at baseline, hence why she uses the walker, and has been unsteady since her prior stroke, but she denies any weakness, dysequilibrium or new/worsening neurological symptoms. She denies N/V, HA, vertigo, slurred speech, acute changes in hearing/vision. Pt states EMS was called and she was brought to the hospital via ambulance. Pt denies F/C, diarrhea, AP, CP/SOB, palpitations, diaphoresis, rigors, night sweats, LH/LOC, urinary symptoms. She endorses fecal incontinence, chronic x3-63mo, but denies frank red blood in the stools. She is thin/underweight and appears dehydrated, but endorses good PO intake, and is otherwise without complaint.  PAST MEDICAL HISTORY:   Past Medical History:  Diagnosis Date  . Glaucoma   . Hypertension     PAST SURGICAL HISTORY:   Past Surgical History:  Procedure Laterality Date  . APPENDECTOMY    . CATARACT EXTRACTION    . ESOPHAGOGASTRODUODENOSCOPY (EGD) WITH ESOPHAGEAL DILATION    . TEE WITHOUT CARDIOVERSION N/A 04/24/2012   Procedure: TRANSESOPHAGEAL ECHOCARDIOGRAM (TEE);  Surgeon: Thurmon Fair, MD;  Location: [facility] ENDOSCOPY;  Service: Cardiovascular;  Laterality: N/A;    SOCIAL HISTORY:   Social History   Tobacco Use  . Smoking status: Never Smoker  . Smokeless tobacco: Never Used  Substance Use Topics  . Alcohol use: No    FAMILY HISTORY:   Family History  Problem Relation Age of Onset  . Stroke Mother   . Stroke Father     DRUG ALLERGIES:   Allergies  Allergen Reactions  . Codeine Nausea And Vomiting  . Penicillins     chilhood  . Sulfa Antibiotics Rash    REVIEW OF SYSTEMS:   Review of Systems  Constitutional: Negative for chills, diaphoresis, fever, malaise/fatigue and weight  loss.  HENT: Negative for congestion, hearing loss, sore throat and tinnitus.   Eyes: Negative for blurred vision, double vision and photophobia.  Respiratory: Negative for cough, hemoptysis, sputum production, shortness of breath and wheezing.   Cardiovascular: Negative for chest pain, palpitations, orthopnea, claudication, leg swelling and PND.  Gastrointestinal: Negative for abdominal pain, blood in stool, constipation, diarrhea, heartburn, melena, nausea and vomiting.  Genitourinary: Negative for dysuria, frequency, hematuria and urgency.  Musculoskeletal: Negative for back pain, joint pain and neck pain.  Skin: Negative for itching and rash.  Neurological: Positive for focal weakness (+) baseline LUE weakness (unchanged). Negative for dizziness, tingling, tremors, sensory change, speech change, seizures, loss of consciousness, weakness and headaches.    MEDICATIONS AT HOME:   Prior to Admission medications   Medication Sig Start Date End Date Taking? Authorizing Provider  dorzolamide-timolol (COSOPT) 22.3-6.8 MG/ML ophthalmic solution Place 1 drop into both eyes 2 (two) times daily.   Yes [provider]  latanoprost (XALATAN) 0.005 % ophthalmic solution Place 1 drop into both eyes at bedtime.   Yes [provider]  Multiple Vitamin (MULTIVITAMIN WITH MINERALS) TABS Take 1 tablet by mouth daily.   Yes [provider]  simvastatin (ZOCOR) 20 MG tablet Take 1 tablet (20 mg total) by mouth daily at 6 PM. For elevated lipids. 05/04/12  Yes Love, Evlyn KannerPamela S, PA-C  traMADol (ULTRAM) 50 MG tablet Take 50 mg by mouth daily as needed for pain.   Yes [provider]  valsartan-hydrochlorothiazide (DIOVAN-HCT) 320-12.5 MG tablet Take 0.5 tablets by mouth every morning.   Yes [provider]  XARELTO 15 MG TABS tablet Take 15 mg by mouth daily.   Yes [provider]  acetaminophen (TYLENOL) 325 MG tablet Take 1-2 tablets (325-650 mg total) by mouth  every 4 (four) hours as needed. Patient not taking: Reported on 05/11/2017 05/04/12   Love, Evlyn KannerPamela S, PA-C  dabigatran (PRADAXA) 75 MG CAPS Take 1 capsule (75 mg total) by mouth every 12 (twelve) hours. Blood thinner Patient not taking: Reported on 05/11/2017 05/04/12   Love, Evlyn KannerPamela S, PA-C  diclofenac sodium (VOLTAREN) 1 % GEL Apply 2 g topically 4 (four) times daily. To right knee for arthritis Patient not taking: Reported on 05/11/2017 05/04/12   Love, Evlyn KannerPamela S, PA-C  irbesartan (AVAPRO) 300 MG tablet Take 1 tablet (300 mg total) by mouth daily. For blood pressure. Patient not taking: Reported on 05/11/2017 05/04/12   Love, Evlyn KannerPamela S, PA-C  senna-docusate (SENOKOT-S) 8.6-50 MG per tablet Take 1 tablet by mouth at bedtime. For constipation. Patient not taking: Reported on 05/11/2017 05/04/12   Love, Evlyn KannerPamela S, PA-C      VITAL SIGNS:  Blood pressure (!) 163/83, pulse 89, temperature 98.8 F (37.1 C), resp. rate 17, height 5\' 5"  (1.651 m), weight 49.4 kg (109 lb), SpO2 100 %.  PHYSICAL EXAMINATION:  Physical Exam  Constitutional: She appears well-developed. She is cooperative.  Non-toxic appearance. She does not have a sickly appearance. She does not  appear ill. No distress.  Thin, dry MM.  HENT:  Head: Normocephalic and atraumatic.  Eyes: Conjunctivae and EOM are normal. No scleral icterus.  Neck: Neck supple. No JVD present. No thyromegaly present.  Cardiovascular: Normal rate, regular rhythm, S1 normal and S2 normal.  No extrasystoles are present. Exam reveals no gallop and no friction rub.  Murmur heard.  Systolic murmur is present with a grade of 3/6.  Diastolic murmur is present with a grade of 3/6. Pulmonary/Chest: Effort normal and breath sounds normal. No stridor. No respiratory distress. She has no wheezes. She has no rhonchi. She has no rales.  Abdominal: Soft. Bowel sounds are normal. She exhibits no distension. There is no tenderness. There is no rebound and no guarding.    Musculoskeletal: She exhibits no edema or tenderness.  Lymphadenopathy:    She has no cervical adenopathy.  Neurological: She is alert.  Oriented to person and place (knows she is in [place] in a hospital, but is unable to name hospital). Not oriented to time (unable to tell me month, year, current POTUS). She is able to tell me what she ate for breakfast. 5/5 strength RUE, 4/5 strength LUE, 3+/5 strength B/L LE. Sensory to crude touch intact. Reflexes intact. Baseline deficits in hearing (mild) and vision (severe), CN III-VI, IX-XII intact.  Skin: Skin is warm and dry. She is not diaphoretic. No erythema. There is pallor.  Psychiatric: She has a normal mood and affect. Her behavior is normal. Her mood appears not anxious. Her affect is not angry, not labile and not inappropriate. Her speech is not rapid and/or pressured and not slurred. She is not agitated, not aggressive, not hyperactive, not actively hallucinating and not combative. Thought content is not paranoid and not delusional. She does not exhibit a depressed mood. She is attentive.    LABORATORY PANEL:   CBC Recent Labs  Lab 05/11/17 2007  WBC 8.8  HGB 8.0*  HCT 25.0*  PLT 381   ------------------------------------------------------------------------------------------------------------------  Chemistries  Recent Labs  Lab 05/11/17 2007  NA 135  K 4.3  CL 104  CO2 25  GLUCOSE 139*  BUN 42*  CREATININE 1.49*  CALCIUM 8.7*   ------------------------------------------------------------------------------------------------------------------  Cardiac Enzymes Recent Labs  Lab 05/11/17 2007  TROPONINI 0.05*   ------------------------------------------------------------------------------------------------------------------  RADIOLOGY:  Ct Head Wo Contrast  Result Date: 05/11/2017 CLINICAL DATA:  Stroke-like symptoms with right-sided facial droop and leaning to the right. EXAM: CT HEAD WITHOUT CONTRAST TECHNIQUE:  Contiguous axial images were obtained from the base of the skull through the vertex without intravenous contrast. COMPARISON:  CT and MR exams from 04/21/2012 FINDINGS: Brain: Remote right posterior parietal lobe infarct with mild ex vacuo dilatation of the adjacent right lateral ventricle. Chronic small vessel ischemic disease noted of the periventricular white matter. No acute intracranial hemorrhage, midline shift or edema. No extra-axial fluid collections. Vascular: Atherosclerosis of the carotid siphons bilaterally. Hyperdense vessel sign. Skull: Negative for fracture or focal lesion. Sinuses/Orbits: No acute finding. Other: None IMPRESSION: Remote right posterior parietal lobe infarct with mild ex vacuo dilatation of the adjacent right lateral ventricle and mild encephalomalacia. No acute intracranial abnormality. Electronically Signed   By: Tollie Eth M.D.   On: 05/11/2017 21:40   Mr Brain Wo Contrast  Result Date: 05/12/2017 CLINICAL DATA:  RIGHT-sided weakness, leaning toward the RIGHT. History of stroke, atrial fibrillation on anticoagulation, hypertension. EXAM: MRI HEAD WITHOUT CONTRAST TECHNIQUE: Multiplanar, multiecho pulse sequences of the brain and surrounding structures were obtained without intravenous contrast.  COMPARISON:  CT HEAD May 11, 2017 and MRI of the head April 21, 2012. FINDINGS: INTRACRANIAL CONTENTS: No reduced diffusion to suggest acute ischemia. No susceptibility artifact to suggest hemorrhage. RIGHT frontoparietal and RIGHT occipital encephalomalacia, worse than prior MRI. Scattered supratentorial white matter FLAIR T2 hyperintensities exclusive of aforementioned abnormality compatible with mild chronic small vessel ischemic disease. No midline shift, mass effect or masses. No advanced parenchymal brain volume loss for age. No hydrocephalus. VASCULAR: Normal major intracranial vascular flow voids present at skull base. Stable 7 x 5 mm A-comm aneurysm. Dolichoectatic  intracranial vessels seen with chronic hypertension. SKULL AND UPPER CERVICAL SPINE: No abnormal sellar expansion. No suspicious calvarial bone marrow signal. Craniocervical junction maintained. SINUSES/ORBITS: The mastoid air-cells and included paranasal sinuses are well-aerated.The included ocular globes and orbital contents are non-suspicious. OTHER: None. IMPRESSION: 1. No acute intracranial process. 2. Propagation of old RIGHT MCA territory and RIGHT posterior watershed territory infarcts. 3. Stable 5 x 7 mm A-comm aneurysm. Electronically Signed   By: Awilda Metro M.D.   On: 05/12/2017 00:14   US Venous Img Lower Bilateral  Result Date: 05/11/2017 CLINICAL DATA:  Bilateral lower extremity pain and difficulty walking today. EXAM: BILATERAL LOWER EXTREMITY VENOUS DOPPLER ULTRASOUND TECHNIQUE: Gray-scale sonography with graded compression, as well as color Doppler and duplex ultrasound were performed to evaluate the lower extremity deep venous systems from the level of the common femoral vein and including the common femoral, femoral, profunda femoral, popliteal and calf veins including the posterior tibial, peroneal and gastrocnemius veins when visible. The superficial great saphenous vein was also interrogated. Spectral Doppler was utilized to evaluate flow at rest and with distal augmentation maneuvers in the common femoral, femoral and popliteal veins. COMPARISON:  None. FINDINGS: RIGHT LOWER EXTREMITY Common Femoral Vein: No evidence of thrombus. Normal compressibility, respiratory phasicity and response to augmentation. Saphenofemoral Junction: No evidence of thrombus. Normal compressibility and flow on color Doppler imaging. Profunda Femoral Vein: No evidence of thrombus. Normal compressibility and flow on color Doppler imaging. Femoral Vein: No evidence of thrombus. Normal compressibility, respiratory phasicity and response to augmentation. Popliteal Vein: No evidence of thrombus. Normal  compressibility, respiratory phasicity and response to augmentation. Calf Veins: No evidence of thrombus. Mild tracking subcutaneous soft tissue edema. Normal compressibility and flow on color Doppler imaging. Superficial Great Saphenous Vein: No evidence of thrombus. Normal compressibility. Venous Reflux:  None. Other Findings:  None. LEFT LOWER EXTREMITY Common Femoral Vein: No evidence of thrombus. Normal compressibility, respiratory phasicity and response to augmentation. Saphenofemoral Junction: No evidence of thrombus. Normal compressibility and flow on color Doppler imaging. Profunda Femoral Vein: No evidence of thrombus. Normal compressibility and flow on color Doppler imaging. Femoral Vein: No evidence of thrombus. Normal compressibility, respiratory phasicity and response to augmentation. Popliteal Vein: No evidence of thrombus. Normal compressibility, respiratory phasicity and response to augmentation. Calf Veins: Limited assessment due to subcutaneous soft tissue edema in the calf. Superficial Great Saphenous Vein: No evidence of thrombus. Normal compressibility. Venous Reflux:  None. Other Findings:  None. IMPRESSION: No evidence of deep venous thrombosis. Subcutaneous soft tissue edema involving the left calf limits assessment of the calf veins. Electronically Signed   By: Tollie Eth M.D.   On: 05/11/2017 22:14    IMPRESSION AND PLAN:   A/P: 101F weakness, dehydration/renal failure, microcytic anemia, troponin elevation.  1.) Weakness: Pt admitted for reportedly leaning to one side and not walking properly. I am told she was admitted due to concern for stroke. She is on  full-dose therapeutic anticoagulation w/ NoAC. The patient does not report any new or progressive neurological deficits, and her neurological examination appears stable when compared to previous exams. CT and MRI imaging do not demonstrate any acute intracranial process.  2.) Dehydration/renal failure: Dry MM on examination.  Cr 1.49 on admission. Pt w/ baseline CKD III (2/2 HTN, aged kidney), baseline Cr 1.0-1.2 (most recently 1.0 as of 04/22/2017). Does not technically meet criteria for AKI (Cr increase by 50% or 0.3). BUN/Cr ratio 42/1.49 = ~28.2 (> 20), dehydrated. Gentle IVF.  3.) Microcytic anemia: Hgb 8.0, MCV 77.7. Microcytic anemia. Guaiac (-). Pt likely w/ iron deficiency anemia, possibly driven by nutritional iron deficiency.  4.) Troponin elevation: Troponin marginally elevated at 0.05. Possibly in setting of strain, dehydration/CKD (decreased renal clearance). Denies CP/SOB. EKG w/ sinus arrhythmia, RBBB, wide QRS, no acute ST/TW ischemic changes. Low concern. C/w Xarelto, Statin, ARB.  5.) Underweight: Pt underweight, low BMI. Appears thin/emaciated. Prealbumin pending.  6.) Hyperglycemia: Glucose 139. HbA1c 5.6 as of 09/03/2016. Intervention unlikely to affect mortality. Monitor.  7.) HTN: c/w HCTZ-Valsartan.  8.) HLD/CVA/AFib: c/w Xarelto, Statin, ARB.  9.) Glaucoma: c/w Cosopt, Xalatan.  10.) FEN/GI: Cardiac diet, IVF.  11.) DVT PPx: Full dose therapeutic anticoagulation w/ Xarelto, continue.  12.) Code Status: Full code for now. Daughter DPOA.  41.) Disposition: Observation, pt expected to stay < 2 midnights.   All the records are reviewed and case discussed with ED provider. Management plans discussed with the patient, family and they are in agreement.  CODE STATUS: Full code.  TOTAL TIME TAKING CARE OF THIS PATIENT: 90 minutes.    Barbaraann Rondo M.D on 05/12/2017 at 12:30 AM  Between 7am to 6pm - Pager - [phone]  After 6pm go to [email or web address] - Social research officer, government  [facility] [place] [facility]  Office  [phone]  CC: Primary care physician; Olive Bass, MD   Note: This dictation was prepared with Dragon dictation along with smaller phrase technology. Any transcriptional errors that result from this process are unintentional.

## 2017-05-12 NOTE — Progress Notes (Signed)
Spoke to patient about going to [facility].   She appears visually upset but states "I guess I have to do what I have to do".

## 2017-05-12 NOTE — NC FL2 (Addendum)
[place] MEDICAID FL2 LEVEL OF CARE SCREENING TOOL     IDENTIFICATION  Patient Name: Sandra Larson Birthdate: Nov 13, 1913 Sex: female Admission Date (Current Location): 05/11/2017  [place] and [place] Number:  Chiropodist and Address:  [facility], [address], [place], [place] [address]      Provider Number: [id number]  Attending Physician Name and Address:  Altamese Dilling, *  Relative Name and Phone Number:  Georgiana Shore Daughter [phone]  [phone] or Moser,Jennifer Grandaughter   [phone] or Steward Ros   [phone]     Current Level of Care: Hospital Recommended Level of Care: Assisted Living Facility Prior Approval Number:    Date Approved/Denied:   PASRR Number:    Discharge Plan: Domiciliary (Rest home)([facility])    Current Diagnoses: Patient Active Problem List   Diagnosis Date Noted  . Weakness 05/11/2017  . 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 Atrial fibrillation (HCC) 04/25/2012  . CVA (cerebral infarction) 04/21/2012  . HTN (hypertension) 04/21/2012    Orientation RESPIRATION BLADDER Height & Weight     Self, Time, Situation, Place  Normal Continent Weight: 89 lb 11.2 oz (40.7 kg) Height:  5\' 2"  (157.5 cm)  BEHAVIORAL SYMPTOMS/MOOD NEUROLOGICAL BOWEL NUTRITION STATUS      Continent Diet  Low sodium heart healthy diet  AMBULATORY STATUS COMMUNICATION OF NEEDS Skin   Limited Assist Verbally Normal                       Personal Care Assistance Level of Assistance  Bathing, Feeding, Dressing Bathing Assistance: Limited assistance Feeding assistance: Limited assistance Dressing Assistance: Limited assistance     Functional Limitations Info  Sight, Speech, Hearing Sight Info: Adequate Hearing Info: Impaired Speech Info: Adequate    SPECIAL CARE FACTORS FREQUENCY  PT (By licensed PT)     PT Frequency: Home Health PT minimum 2x a week.               Contractures Contractures Info: Not present    Additional Factors Info  Code Status, Allergies Code Status Info: Full Code Allergies Info: CODEINE, PENICILLINS, SULFA ANTIBIOTICS            Current Medications (05/12/2017):  This is the current hospital active medication list Current Facility-Administered Medications  Medication Dose Route Frequency Provider Last Rate Last Dose  . acetaminophen (TYLENOL) tablet 650 mg  650 mg Oral Q6H PRN Barbaraann Rondo, MD       Or  . acetaminophen (TYLENOL) suppository 650 mg  650 mg Rectal Q6H PRN Barbaraann Rondo, MD      . apixaban (ELIQUIS) tablet 2.5 mg  2.5 mg Oral BID Barbaraann Rondo, MD   2.5 mg at 05/12/17 1044  . bisacodyl (DULCOLAX) EC tablet 5 mg  5 mg Oral Daily PRN Barbaraann Rondo, MD      . dorzolamide-timolol (COSOPT) 22.3-6.8 MG/ML ophthalmic solution 1 drop  1 drop Both Eyes BID Barbaraann Rondo, MD   1 drop at 05/12/17 1044  . irbesartan (AVAPRO) tablet 300 mg  300 mg Oral Vinnie Langton, Prasanna, MD   300 mg at 05/12/17 1044   And  . hydrochlorothiazide (MICROZIDE) capsule 12.5 mg  12.5 mg Oral Vinnie Langton, Prasanna, MD   12.5 mg at 05/12/17 1044  . latanoprost (XALATAN) 0.005 % ophthalmic solution 1 drop  1 drop Both Eyes QHS Sridharan, Prasanna, MD      . multivitamin with minerals tablet 1 tablet  1 tablet Oral Daily Sridharan,  Prasanna, MD   1 tablet at 05/12/17 1044  . ondansetron (ZOFRAN) tablet 4 mg  4 mg Oral Q6H PRN Barbaraann RondoSridharan, Prasanna, MD       Or  . ondansetron (ZOFRAN) injection 4 mg  4 mg Intravenous Q6H PRN Barbaraann RondoSridharan, Prasanna, MD      . senna-docusate (Senokot-S) tablet 1 tablet  1 tablet Oral QHS PRN Barbaraann RondoSridharan, Prasanna, MD      . simvastatin (ZOCOR) tablet 20 mg  20 mg Oral q1800 Barbaraann RondoSridharan, Prasanna, MD      . traMADol Janean Sark(ULTRAM) tablet 50 mg  50 mg Oral Q12H PRN Barbaraann RondoSridharan, Prasanna, MD      . tuberculin injection 5 Units  5 Units Intradermal Once Altamese DillingVachhani, Vaibhavkumar, MD   5 Units  at 05/12/17 1346     Discharge Medications: STOP taking these medications   dabigatran 75 MG Caps capsule Commonly known as:  PRADAXA   diclofenac sodium 1 % Gel Commonly known as:  VOLTAREN   irbesartan 300 MG tablet Commonly known as:  AVAPRO   senna-docusate 8.6-50 MG tablet Commonly known as:  Senokot-S   valsartan-hydrochlorothiazide 320-12.5 MG tablet Commonly known as:  DIOVAN-HCT     TAKE these medications   acetaminophen 325 MG tablet Commonly known as:  TYLENOL Take 1-2 tablets (325-650 mg total) by mouth every 4 (four) hours as needed.   dorzolamide-timolol 22.3-6.8 MG/ML ophthalmic solution Commonly known as:  COSOPT Place 1 drop into both eyes 2 (two) times daily.   latanoprost 0.005 % ophthalmic solution Commonly known as:  XALATAN Place 1 drop into both eyes at bedtime.   losartan 100 MG tablet Commonly known as:  COZAAR Take 1 tablet (100 mg total) by mouth daily.   multivitamin with minerals Tabs tablet Take 1 tablet by mouth daily.   simvastatin 20 MG tablet Commonly known as:  ZOCOR Take 1 tablet (20 mg total) by mouth daily at 6 PM. For elevated lipids.   traMADol 50 MG tablet Commonly known as:  ULTRAM Take 50 mg by mouth daily as needed for pain.   XARELTO 15 MG Tabs tablet Generic drug:  Rivaroxaban Take 15 mg by mouth daily.      Relevant Imaging Results:  Relevant Lab Results:   Additional Information    Ervin Knackric R. Amara Manalang, MSW, Theresia MajorsLCSWA [phone]  05/12/2017 4:04 PM

## 2017-05-12 NOTE — Progress Notes (Signed)
Patient is experiencing urinary retention. Bladder scan revealed 576cc. Received orders from Dr.Sridhana for an in and out cath.

## 2017-05-12 NOTE — Care Management Obs Status (Signed)
MEDICARE OBSERVATION STATUS NOTIFICATION   Patient Details  Name: Sandra MulchDaile S Berk MRN: [id number] Date of Birth: 06/04/1913   Medicare Observation Status Notification Given:  Yes    Marily MemosLisa M Jenisse Vullo, RN 05/12/2017, 9:45 AM

## 2017-05-13 LAB — URINE CULTURE: Culture: NO GROWTH

## 2017-05-13 LAB — UREA NITROGEN, URINE: UREA NITROGEN UR: 793 mg/dL

## 2017-05-26 ENCOUNTER — Emergency Department: Payer: Medicare Other

## 2017-05-26 ENCOUNTER — Encounter: Payer: Self-pay | Admitting: Emergency Medicine

## 2017-05-26 ENCOUNTER — Inpatient Hospital Stay
Admission: EM | Admit: 2017-05-26 | Discharge: 2017-05-30 | DRG: 377 | Disposition: A | Discharge status: Hospice | Payer: Medicare Other | Attending: Internal Medicine | Admitting: Internal Medicine

## 2017-05-26 ENCOUNTER — Other Ambulatory Visit: Payer: Self-pay

## 2017-05-26 DIAGNOSIS — Z681 Body mass index (BMI) 19 or less, adult: Secondary | ICD-10-CM

## 2017-05-26 DIAGNOSIS — K922 Gastrointestinal hemorrhage, unspecified: Secondary | ICD-10-CM | POA: Diagnosis present

## 2017-05-26 DIAGNOSIS — D62 Acute posthemorrhagic anemia: Secondary | ICD-10-CM | POA: Diagnosis present

## 2017-05-26 DIAGNOSIS — Z515 Encounter for palliative care: Secondary | ICD-10-CM | POA: Diagnosis not present

## 2017-05-26 DIAGNOSIS — Z7901 Long term (current) use of anticoagulants: Secondary | ICD-10-CM

## 2017-05-26 DIAGNOSIS — N183 Chronic kidney disease, stage 3 (moderate): Secondary | ICD-10-CM | POA: Diagnosis present

## 2017-05-26 DIAGNOSIS — R197 Diarrhea, unspecified: Secondary | ICD-10-CM | POA: Diagnosis present

## 2017-05-26 DIAGNOSIS — Z66 Do not resuscitate: Secondary | ICD-10-CM | POA: Diagnosis present

## 2017-05-26 DIAGNOSIS — I129 Hypertensive chronic kidney disease with stage 1 through stage 4 chronic kidney disease, or unspecified chronic kidney disease: Secondary | ICD-10-CM | POA: Diagnosis present

## 2017-05-26 DIAGNOSIS — J181 Lobar pneumonia, unspecified organism: Secondary | ICD-10-CM | POA: Diagnosis present

## 2017-05-26 DIAGNOSIS — R0602 Shortness of breath: Secondary | ICD-10-CM | POA: Diagnosis present

## 2017-05-26 DIAGNOSIS — N179 Acute kidney failure, unspecified: Secondary | ICD-10-CM | POA: Diagnosis present

## 2017-05-26 DIAGNOSIS — I482 Chronic atrial fibrillation: Secondary | ICD-10-CM | POA: Diagnosis present

## 2017-05-26 DIAGNOSIS — Z8673 Personal history of transient ischemic attack (TIA), and cerebral infarction without residual deficits: Secondary | ICD-10-CM | POA: Diagnosis not present

## 2017-05-26 DIAGNOSIS — Z823 Family history of stroke: Secondary | ICD-10-CM | POA: Diagnosis not present

## 2017-05-26 DIAGNOSIS — H409 Unspecified glaucoma: Secondary | ICD-10-CM | POA: Diagnosis present

## 2017-05-26 DIAGNOSIS — K921 Melena: Secondary | ICD-10-CM | POA: Diagnosis present

## 2017-05-26 DIAGNOSIS — F0390 Unspecified dementia without behavioral disturbance: Secondary | ICD-10-CM | POA: Diagnosis present

## 2017-05-26 DIAGNOSIS — R748 Abnormal levels of other serum enzymes: Secondary | ICD-10-CM | POA: Diagnosis present

## 2017-05-26 DIAGNOSIS — J9601 Acute respiratory failure with hypoxia: Secondary | ICD-10-CM | POA: Diagnosis present

## 2017-05-26 DIAGNOSIS — E860 Dehydration: Secondary | ICD-10-CM | POA: Diagnosis present

## 2017-05-26 DIAGNOSIS — E43 Unspecified severe protein-calorie malnutrition: Secondary | ICD-10-CM | POA: Diagnosis present

## 2017-05-26 DIAGNOSIS — R627 Adult failure to thrive: Secondary | ICD-10-CM | POA: Diagnosis present

## 2017-05-26 DIAGNOSIS — J189 Pneumonia, unspecified organism: Secondary | ICD-10-CM

## 2017-05-26 DIAGNOSIS — Y95 Nosocomial condition: Secondary | ICD-10-CM | POA: Diagnosis present

## 2017-05-26 LAB — CBC WITH DIFFERENTIAL/PLATELET
Basophils Absolute: 0 10*3/uL (ref 0–0.1)
Basophils Relative: 0 %
Eosinophils Absolute: 0 10*3/uL (ref 0–0.7)
Eosinophils Relative: 0 %
HEMATOCRIT: 20 % — AB (ref 35.0–47.0)
Hemoglobin: 6.2 g/dL — ABNORMAL LOW (ref 12.0–16.0)
LYMPHS ABS: 0.8 10*3/uL — AB (ref 1.0–3.6)
LYMPHS PCT: 6 %
MCH: 25.6 pg — AB (ref 26.0–34.0)
MCHC: 31.1 g/dL — AB (ref 32.0–36.0)
MCV: 82.1 fL (ref 80.0–100.0)
MONOS PCT: 7 %
Monocytes Absolute: 1 10*3/uL — ABNORMAL HIGH (ref 0.2–0.9)
NEUTROS ABS: 12.3 10*3/uL — AB (ref 1.4–6.5)
Neutrophils Relative %: 87 %
Platelets: 615 10*3/uL — ABNORMAL HIGH (ref 150–440)
RBC: 2.44 MIL/uL — ABNORMAL LOW (ref 3.80–5.20)
RDW: 18.3 % — ABNORMAL HIGH (ref 11.5–14.5)
WBC: 14.1 10*3/uL — ABNORMAL HIGH (ref 3.6–11.0)

## 2017-05-26 LAB — BASIC METABOLIC PANEL
Anion gap: 11 (ref 5–15)
BUN: 59 mg/dL — ABNORMAL HIGH (ref 6–20)
CHLORIDE: 105 mmol/L (ref 101–111)
CO2: 20 mmol/L — AB (ref 22–32)
CREATININE: 1.05 mg/dL — AB (ref 0.44–1.00)
Calcium: 8 mg/dL — ABNORMAL LOW (ref 8.9–10.3)
GFR calc Af Amer: 48 mL/min — ABNORMAL LOW (ref >60)
GFR calc non Af Amer: 42 mL/min — ABNORMAL LOW (ref >60)
GLUCOSE: 154 mg/dL — AB (ref 65–99)
Potassium: 4.2 mmol/L (ref 3.5–5.1)
Sodium: 136 mmol/L (ref 135–145)

## 2017-05-26 LAB — TROPONIN I: Troponin I: 0.06 ng/mL (ref <0.03)

## 2017-05-26 LAB — ABO/RH: ABO/RH(D): O NEG

## 2017-05-26 LAB — LACTIC ACID, PLASMA: LACTIC ACID, VENOUS: 1.7 mmol/L (ref 0.5–1.9)

## 2017-05-26 MED ORDER — GUAIFENESIN 100 MG/5ML PO SOLN
5.0000 mL | ORAL | Status: DC | PRN
  Filled 2017-05-26: qty 5

## 2017-05-26 MED ORDER — ACETAMINOPHEN 325 MG PO TABS: 650.0000 mg | ORAL_TABLET | Freq: Four times a day (QID) | ORAL | Status: DC | PRN

## 2017-05-26 MED ORDER — CEFEPIME HCL 1 G IJ SOLR
1.0000 g | Freq: Once | INTRAMUSCULAR | Status: AC
  Administered 2017-05-26: 1 g via INTRAVENOUS
  Filled 2017-05-26: qty 1

## 2017-05-26 MED ORDER — ALBUTEROL SULFATE (2.5 MG/3ML) 0.083% IN NEBU: 2.5000 mg | INHALATION_SOLUTION | RESPIRATORY_TRACT | Status: DC | PRN

## 2017-05-26 MED ORDER — SODIUM CHLORIDE 0.9 % IV SOLN
2.0000 g | INTRAVENOUS | Status: DC
  Filled 2017-05-26 (×2): qty 2

## 2017-05-26 MED ORDER — ONDANSETRON HCL 4 MG PO TABS: 4.0000 mg | ORAL_TABLET | Freq: Four times a day (QID) | ORAL | Status: DC | PRN

## 2017-05-26 MED ORDER — PANTOPRAZOLE SODIUM 40 MG IV SOLR: 40.0000 mg | Freq: Two times a day (BID) | INTRAVENOUS | Status: DC

## 2017-05-26 MED ORDER — ACETAMINOPHEN 650 MG RE SUPP: 650.0000 mg | Freq: Four times a day (QID) | RECTAL | Status: DC | PRN

## 2017-05-26 MED ORDER — SODIUM CHLORIDE 0.9 % IV SOLN
8.0000 mg/h | INTRAVENOUS | Status: DC
  Administered 2017-05-26 – 2017-05-27 (×2): 8 mg/h via INTRAVENOUS
  Filled 2017-05-26 (×2): qty 80

## 2017-05-26 MED ORDER — SODIUM CHLORIDE 0.9 % IV SOLN
INTRAVENOUS | Status: DC
  Administered 2017-05-26: 20:00:00 via INTRAVENOUS

## 2017-05-26 MED ORDER — ONDANSETRON HCL 4 MG/2ML IJ SOLN: 4.0000 mg | Freq: Four times a day (QID) | INTRAMUSCULAR | Status: DC | PRN

## 2017-05-26 MED ORDER — VANCOMYCIN HCL IN DEXTROSE 1-5 GM/200ML-% IV SOLN
1000.0000 mg | Freq: Once | INTRAVENOUS | Status: AC
  Administered 2017-05-26: 1000 mg via INTRAVENOUS
  Filled 2017-05-26: qty 200

## 2017-05-26 MED ORDER — SODIUM CHLORIDE 0.9 % IV SOLN
10.0000 mL/h | Freq: Once | INTRAVENOUS | Status: AC
  Administered 2017-05-26: 10 mL/h via INTRAVENOUS

## 2017-05-26 MED ORDER — SODIUM CHLORIDE 0.9 % IV SOLN
80.0000 mg | Freq: Once | INTRAVENOUS | Status: AC
  Administered 2017-05-26: 80 mg via INTRAVENOUS
  Filled 2017-05-26: qty 80

## 2017-05-26 MED ORDER — VANCOMYCIN HCL 500 MG IV SOLR
500.0000 mg | INTRAVENOUS | Status: DC
  Filled 2017-05-26: qty 500

## 2017-05-26 NOTE — Progress Notes (Signed)
ANTIBIOTIC CONSULT NOTE - INITIAL  Pharmacy Consult for Vancomycin and Cefepime Indication: pneumonia  Allergies  Allergen Reactions  . Codeine Nausea And Vomiting  . Penicillins     chilhood  . Sulfa Antibiotics Rash    Patient Measurements: Height:  (157.5 cm) Weight: 96 lb (43.5 kg) IBW/kg (Calculated) : 50.1 Adjusted Body Weight:   Vital Signs: Temp: 98.4 F (36.9 C) (05/09 1215) Temp Source: Axillary (05/09 1215) BP: 111/62 (05/09 1330) Pulse Rate: 95 (05/09 1330) Intake/Output from previous day: No intake/output data recorded. Intake/Output from this shift: No intake/output data recorded.  Labs: Recent Labs    05/26/17 1046  WBC 14.1*  HGB 6.2*  PLT 615*  CREATININE 1.05*   Estimated Creatinine Clearance: 19.1 mL/min (A) (by C-G formula based on SCr of 1.05 mg/dL (H)). No results for input(s): VANCOTROUGH, VANCOPEAK, VANCORANDOM, GENTTROUGH, GENTPEAK, GENTRANDOM, TOBRATROUGH, TOBRAPEAK, TOBRARND, AMIKACINPEAK, AMIKACINTROU, AMIKACIN in the last 72 hours.   Microbiology: Recent Results (from the past 720 hour(s))  Urine Culture     Status: None   Collection Time: 05/11/17  8:53 PM  Result Value Ref Range Status   Specimen Description   Final    URINE, RANDOM Performed at [facility], [address], [place], [place] [address]    Special Requests   Final    NONE Performed at [facility], [address], [place], [place] [address]    Culture   Final    NO GROWTH Performed at [facility], [address] [place], [place] [address]    Report Status 05/13/2017 FINAL  Final    Medical History: Past Medical History:  Diagnosis Date  . Glaucoma   . Hypertension      Assessment: Patient is a 84yo female admitted for GI bleed and HCAP. Pharmacy consulted for Vancomycin and Cefepime dosing.  Est. CrCl = 19.1 ml/min  Vancomycin pharmacokinetics: Ke=0.02 T1/2=35hr Vd=30.5L  Goal of Therapy:  Vancomycin trough  level 15-20 mcg/ml  Plan:  Will order Cefepime 2g IV q24h Will order Vancomycin  IV q36h, patient received 1g dose in the ED so will not use stacked dosing. Will follow renal function closely, may need to adjust dose or check a level prior to steady state.  Clovia Cuff, PharmD, BCPS 05/26/2017 2:30 PM

## 2017-05-26 NOTE — Progress Notes (Signed)
Advanced Care Plan.  Purpose of Encounter: CODE STATUS and prognosis. Parties in Attendance: The patient, her daughter, daughter-in-law, sister and son-in-law; me. Patient's Decisional Capacity: No. Medical Story: Sandra Larson  is a 84 y.o. female with a known history of A. fib on Eliquis, CVA, hypertension and dementia.  The patient was sent from nursing home due to worsening shortness of breath and cough for several days.    She was found pneumonia, leukocytosis, GI bleeding and anemia.  She also has severe malnutrition.  I discussed with the patient and family members about her current condition, very poor prognosis and CODE STATUS.  Both the patient and her daughter said the patient wants DNR.  Since the patient has very poor prognosis, will request palliative care consult.  Plan:  Code Status: DNR. Time spent discussing advance care planning: 20 minutes.

## 2017-05-26 NOTE — ED Notes (Signed)
Date and time results received: 05/26/17 1124   Test: troponin Critical Value: 0.06  Name of Provider Notified: dr. Don Perking

## 2017-05-26 NOTE — Progress Notes (Signed)
Pt stated that she has not voided since last night. Primary RN Bladder scanned pt with a reading of 467. Primary nurse paged and spoke to Dr. Caryn Bee. Orders received to straight cath pt. Primary nurse to continue to monitor.

## 2017-05-26 NOTE — ED Notes (Signed)
Dr. Imogene Burn at bedside to talk to family.

## 2017-05-26 NOTE — ED Provider Notes (Signed)
[facility] Emergency Department Provider Note  ____________________________________________  Time seen: Approximately 10:23 AM  I have reviewed the triage vital signs and the nursing notes.   HISTORY  Chief Complaint Shortness of Breath   HPI Sandra Larson is a 84 y.o. female with a history of atrial fibrillation on Eliquis, CVA, hypertension, malnutrition who presents for evaluation of difficulty breathing.  Patient reports that she has had a cough for several days.  She endorses shortness of breath which is mild at rest but becomes moderate to severe with exertion for a few days.  She was visited by a doctor at her nursing facility today and it was found to be tachypneic with respiratory rate in the 40s and sent to the emergency room for evaluation.  Patient denies chest pain.  No recorded fevers.  She has had diarrhea however this is a chronic problem for her.  No nausea or vomiting.  No abdominal pain.  Past Medical History:  Diagnosis Date  . Glaucoma   . Hypertension     Patient Active Problem List   Diagnosis Date Noted  . Protein-calorie malnutrition, severe 05/12/2017  . Weakness 05/11/2017  . 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 Atrial fibrillation (HCC) 04/25/2012  . CVA (cerebral infarction) 04/21/2012  . HTN (hypertension) 04/21/2012    Past Surgical History:  Procedure Laterality Date  . APPENDECTOMY    . CATARACT EXTRACTION    . ESOPHAGOGASTRODUODENOSCOPY (EGD) WITH ESOPHAGEAL DILATION    . TEE WITHOUT CARDIOVERSION N/A 04/24/2012   Procedure: TRANSESOPHAGEAL ECHOCARDIOGRAM (TEE);  Surgeon: Thurmon Fair, MD;  Location: [facility] ENDOSCOPY;  Service: Cardiovascular;  Laterality: N/A;    Prior to Admission medications   Medication Sig Start Date End Date Taking? Authorizing Provider  acetaminophen (TYLENOL) 325 MG tablet Take 1-2 tablets (325-650 mg total) by mouth every 4 (four) hours as needed. 05/04/12  Yes Love, Evlyn Kanner, PA-C    dorzolamide-timolol (COSOPT) 22.3-6.8 MG/ML ophthalmic solution Place 1 drop into both eyes 2 (two) times daily.   Yes [provider]  latanoprost (XALATAN) 0.005 % ophthalmic solution Place 1 drop into both eyes at bedtime.   Yes [provider]  losartan (COZAAR) 100 MG tablet Take 1 tablet (100 mg total) by mouth daily. 05/12/17 05/12/18 Yes Altamese Dilling, MD  Multiple Vitamin (MULTIVITAMIN WITH MINERALS) TABS Take 1 tablet by mouth daily.   Yes [provider]  simvastatin (ZOCOR) 20 MG tablet Take 1 tablet (20 mg total) by mouth daily at 6 PM. For elevated lipids. 05/04/12  Yes Love, Evlyn Kanner, PA-C  traMADol (ULTRAM) 50 MG tablet Take 50 mg by mouth daily as needed for pain.   Yes [provider]  XARELTO 15 MG TABS tablet Take 15 mg by mouth daily.   Yes [provider]    Allergies Codeine; Penicillins; and Sulfa antibiotics  Family History  Problem Relation Age of Onset  . Stroke Mother   . Stroke Father     Social History Social History   Tobacco Use  . Smoking status: Never Smoker  . Smokeless tobacco: Never Used  Substance Use Topics  . Alcohol use: No  . Drug use: No    Review of Systems  Constitutional: Negative for fever. Eyes: Negative for visual changes. ENT: Negative for sore throat. Neck: No neck pain  Cardiovascular: Negative for chest pain. Respiratory: + shortness of breath, cough Gastrointestinal: Negative for abdominal pain, vomiting. +diarrhea. Genitourinary: Negative for dysuria. Musculoskeletal: Negative for back pain.  Skin: Negative for rash. Neurological: Negative for headaches, weakness or numbness. Psych: No SI or HI  ____________________________________________   PHYSICAL EXAM:  VITAL SIGNS: Vitals:   05/26/17 1100 05/26/17 1115  BP: 109/62 110/82  Pulse:    Resp: (!) 24 (!) 23  Temp:    SpO2:     Constitutional: Alert and oriented, nodistress.  HEENT:      Head:  Normocephalic and atraumatic.         Eyes: Conjunctivae are normal. Sclera is non-icteric.       Mouth/Throat: Mucous membranes are moist.       Neck: Supple with no signs of meningismus. Cardiovascular: Regular rate and rhythm. No murmurs, gallops, or rubs. 2+ symmetrical distal pulses are present in all extremities. No JVD. Respiratory: Slightly increased work of breathing, respiratory rate of 38, normal sats on room air. Lungs are clear to auscultation bilaterally with crackles on the L side.  Gastrointestinal: Soft, non tender, and non distended with positive bowel sounds. No rebound or guarding. Genitourinary: No CVA tenderness. Musculoskeletal: Nontender with normal range of motion in all extremities. No edema, cyanosis, or erythema of extremities. Neurologic: Normal speech and language. Face is symmetric. Moving all extremities. No gross focal neurologic deficits are appreciated. Skin: Skin is warm, dry and intact. No rash noted. Psychiatric: Mood and affect are normal. Speech and behavior are normal.  ____________________________________________   LABS (all labs ordered are listed, but only abnormal results are displayed)  Labs Reviewed  CBC WITH DIFFERENTIAL/PLATELET - Abnormal; Notable for the following components:      Result Value   WBC 14.1 (*)    RBC 2.44 (*)    Hemoglobin 6.2 (*)    HCT 20.0 (*)    MCH 25.6 (*)    MCHC 31.1 (*)    RDW 18.3 (*)    Platelets 615 (*)    Neutro Abs 12.3 (*)    Lymphs Abs 0.8 (*)    Monocytes Absolute 1.0 (*)    All other components within normal limits  BASIC METABOLIC PANEL - Abnormal; Notable for the following components:   CO2 20 (*)    Glucose, Bld 154 (*)    BUN 59 (*)    Creatinine, Ser 1.05 (*)    Calcium 8.0 (*)    GFR calc non Af Amer 42 (*)    GFR calc Af Amer 48 (*)    All other components within normal limits  TROPONIN I - Abnormal; Notable for the following components:   Troponin I 0.06 (*)    All other components  within normal limits  TYPE AND SCREEN  PREPARE RBC (CROSSMATCH)  ABO/RH   ____________________________________________  EKG  ED ECG REPORT I, Nita Sickle, the attending physician, personally viewed and interpreted this ECG.  Atrial fibrillation, rate of 96, right bundle branch block, normal QTC, normal axis, no ST elevations or depressions ____________________________________________  RADIOLOGY  I have personally reviewed the images performed during this visit and I agree with the Radiologist's read.   Interpretation by Radiologist:  Dg Chest 2 View  Result Date: 05/26/2017 CLINICAL DATA:  Patient with increased respirations. EXAM: CHEST - 2 VIEW COMPARISON:  Chest radiograph 05/12/2017 FINDINGS: Patient is rotated to the left. Stable cardiomegaly. Thoracic aortic vascular calcifications. Retrocardiac consolidation. Small left pleural effusion. No pneumothorax. Osseous demineralization. Thoracic spine degenerative changes. IMPRESSION: Small to moderate left pleural effusion with underlying consolidation which may represent atelectasis or infection. Electronically Signed   By: Francis Gaines.D.  On: 05/26/2017 11:22     ____________________________________________   PROCEDURES  Procedure(s) performed: None Procedures Critical Care performed: yes  CRITICAL CARE Performed by: Nita Sickle  ?  Total critical care time: 30 min  Critical care time was exclusive of separately billable procedures and treating other patients.  Critical care was necessary to treat or prevent imminent or life-threatening deterioration.  Critical care was time spent personally by me on the following activities: development of treatment plan with patient and/or surrogate as well as nursing, discussions with consultants, evaluation of patient's response to treatment, examination of patient, obtaining history from patient or surrogate, ordering and performing treatments and interventions,  ordering and review of laboratory studies, ordering and review of radiographic studies, pulse oximetry and re-evaluation of patient's condition.  ____________________________________________   INITIAL IMPRESSION / ASSESSMENT AND PLAN / ED COURSE  84 y.o. female with a history of atrial fibrillation on Eliquis, CVA, hypertension, malnutrition who presents for evaluation of difficulty breathing, cough and tachypnea at SNF today.  Patient was slightly increased work of breathing, tachypneic to the upper 30s with normal oxygenation on room air, she does have crackles on the left lung and clear on the right with good air movement.  Patient looks euvolemic on exam.  Differential diagnoses including pneumonia versus pleural effusion versus pulmonary edema versus CHF versus ACS.  Less likely PE on Eliquis and no chest pain.  Chest x-ray is pending.  Labs are pending.    _________________________ 11:45 AM on 05/26/2017 -----------------------------------------  Labs showing anemia with hemoglobin of 6.2.  Rectal exam showing gross melena guaiac positive.  Patient is on Eliquis.  She is hemodynamically stable.  Patient was started on Protonix and will be given 1 unit of emergent release blood due to hypotension per EMS.  Chest x-ray also concerning for left lower lobe pneumonia with a white count of 14.  Patient is afebrile with no tachycardia.  Does not meet sepsis criteria.  Will start treatment for HCAP with cefepime and vancomycin.  Will admit to hospitalist service.   As part of my medical decision making, I reviewed the following data within the electronic MEDICAL RECORD NUMBER Nursing notes reviewed and incorporated, Labs reviewed , EKG interpreted , Old EKG reviewed, Old chart reviewed, Radiograph reviewed , Discussed with admitting physician , Notes from prior ED visits and [place] Controlled Substance Database    Pertinent labs & imaging results that were available during my care of the patient were  reviewed by me and considered in my medical decision making (see chart for details).    ____________________________________________   FINAL CLINICAL IMPRESSION(S) / ED DIAGNOSES  Final diagnoses:  Acute blood loss anemia  Upper GI bleed  Shortness of breath  HCAP (healthcare-associated pneumonia)      NEW MEDICATIONS STARTED DURING THIS VISIT:  ED Discharge Orders    None       Note:  This document was prepared using Dragon voice recognition software and may include unintentional dictation errors.    Don Perking, [place], MD 05/26/17 [phone]

## 2017-05-26 NOTE — ED Notes (Signed)
Verified 1st unit of emergency blood with Isaiah Blakes RN

## 2017-05-26 NOTE — H&P (Addendum)
[facility] - [place] [facility]   PATIENT NAME: Sandra Larson    MR#:  [id number]  DATE OF BIRTH:  [birth year]  DATE OF ADMISSION:  05/26/2017  PRIMARY CARE PHYSICIAN: Olive Bass, MD   REQUESTING/REFERRING PHYSICIAN: Nita Sickle, MD  CHIEF COMPLAINT:   Chief Complaint  Patient presents with  . Shortness of Breath   Worsening shortness of breath and cough several days. HISTORY OF PRESENT ILLNESS:  Sandra Larson  is a 84 y.o. female with a known history of A. fib on Eliquis, CVA, hypertension and dementia.  The patient was sent from nursing home due to worsening shortness of breath and cough for several days.  She was found left lower lobe pneumonia and leukocytosis at 14,000 in the ED.  She is treated with cefepime and vancomycin.  She also was found low hemoglobin at 6.2.  Upon questioning, she said that she has bloody stool.  Dr. Don Perking is ordered PRBC transfusion and request admission.  PAST MEDICAL HISTORY:   Past Medical History:  Diagnosis Date  . Glaucoma   . Hypertension     PAST SURGICAL HISTORY:   Past Surgical History:  Procedure Laterality Date  . APPENDECTOMY    . CATARACT EXTRACTION    . ESOPHAGOGASTRODUODENOSCOPY (EGD) WITH ESOPHAGEAL DILATION    . TEE WITHOUT CARDIOVERSION N/A 04/24/2012   Procedure: TRANSESOPHAGEAL ECHOCARDIOGRAM (TEE);  Surgeon: Thurmon Fair, MD;  Location: [facility] ENDOSCOPY;  Service: Cardiovascular;  Laterality: N/A;    SOCIAL HISTORY:   Social History   Tobacco Use  . Smoking status: Never Smoker  . Smokeless tobacco: Never Used  Substance Use Topics  . Alcohol use: No    FAMILY HISTORY:   Family History  Problem Relation Age of Onset  . Stroke Mother   . Stroke Father     DRUG ALLERGIES:   Allergies  Allergen Reactions  . Codeine Nausea And Vomiting  . Penicillins     chilhood  . Sulfa Antibiotics Rash    REVIEW OF SYSTEMS:   Review of Systems  Constitutional: Positive  for malaise/fatigue. Negative for chills and fever.  HENT: Negative for sore throat.   Eyes: Negative for blurred vision and double vision.  Respiratory: Positive for cough, sputum production and shortness of breath. Negative for hemoptysis, wheezing and stridor.   Cardiovascular: Negative for chest pain, palpitations, orthopnea and leg swelling.  Gastrointestinal: Positive for blood in stool and diarrhea. Negative for abdominal pain, melena, nausea and vomiting.  Genitourinary: Negative for dysuria, flank pain and hematuria.  Musculoskeletal: Negative for back pain and joint pain.  Neurological: Negative for dizziness, sensory change, focal weakness, seizures, loss of consciousness, weakness and headaches.  Endo/Heme/Allergies: Negative for polydipsia.  Psychiatric/Behavioral: Negative for depression. The patient is not nervous/anxious.     MEDICATIONS AT HOME:   Prior to Admission medications   Medication Sig Start Date End Date Taking? Authorizing Provider  acetaminophen (TYLENOL) 325 MG tablet Take 1-2 tablets (325-650 mg total) by mouth every 4 (four) hours as needed. 05/04/12  Yes Love, Evlyn Kanner, PA-C  dorzolamide-timolol (COSOPT) 22.3-6.8 MG/ML ophthalmic solution Place 1 drop into both eyes 2 (two) times daily.   Yes [provider]  latanoprost (XALATAN) 0.005 % ophthalmic solution Place 1 drop into both eyes at bedtime.   Yes [provider]  losartan (COZAAR) 100 MG tablet Take 1 tablet (100 mg total) by mouth daily. 05/12/17 05/12/18 Yes Altamese Dilling, MD  Multiple Vitamin (MULTIVITAMIN WITH MINERALS) TABS Take  1 tablet by mouth daily.   Yes [provider]  simvastatin (ZOCOR) 20 MG tablet Take 1 tablet (20 mg total) by mouth daily at 6 PM. For elevated lipids. 05/04/12  Yes Love, Evlyn Kanner, PA-C  traMADol (ULTRAM) 50 MG tablet Take 50 mg by mouth daily as needed for pain.   Yes [provider]  XARELTO 15 MG TABS tablet Take 15 mg by mouth  daily.   Yes [provider]      VITAL SIGNS:  Blood pressure (!) 136/51, pulse 84, temperature 98.2 F (36.8 C), temperature source Axillary, resp. rate (!) 21, height  (1.575 m), weight 96 lb (43.5 kg), SpO2 100 %.  PHYSICAL EXAMINATION:  Physical Exam  GENERAL:  84 y.o.-year-old patient lying in the bed with no acute distress.  Severe malnutrition. EYES: Pupils equal, round, reactive to light and accommodation. No scleral icterus. Extraocular muscles intact.  HEENT: Head atraumatic, normocephalic. Oropharynx and nasopharynx clear.  NECK:  Supple, no jugular venous distention. No thyroid enlargement, no tenderness.  LUNGS: No wheezing, but has crackles. No use of accessory muscles of respiration.  CARDIOVASCULAR: S1, S2 normal. No murmurs, rubs, or gallops.  ABDOMEN: Soft, nontender, nondistended. Bowel sounds present. No organomegaly or mass.  EXTREMITIES: No pedal edema, cyanosis, or clubbing.  NEUROLOGIC: Cranial nerves II through XII are intact. Muscle strength 4/5 in all extremities. Sensation intact. Gait not checked.  PSYCHIATRIC: The patient is alert and oriented x 3.  SKIN: No obvious rash, lesion, or ulcer.   LABORATORY PANEL:   CBC Recent Labs  Lab 05/26/17 1046  WBC 14.1*  HGB 6.2*  HCT 20.0*  PLT 615*   ------------------------------------------------------------------------------------------------------------------  Chemistries  Recent Labs  Lab 05/26/17 1046  NA 136  K 4.2  CL 105  CO2 20*  GLUCOSE 154*  BUN 59*  CREATININE 1.05*  CALCIUM 8.0*   ------------------------------------------------------------------------------------------------------------------  Cardiac Enzymes Recent Labs  Lab 05/26/17 1046  TROPONINI 0.06*   ------------------------------------------------------------------------------------------------------------------  RADIOLOGY:  Dg Chest 2 View  Result Date: 05/26/2017 CLINICAL DATA:  Patient with  increased respirations. EXAM: CHEST - 2 VIEW COMPARISON:  Chest radiograph 05/12/2017 FINDINGS: Patient is rotated to the left. Stable cardiomegaly. Thoracic aortic vascular calcifications. Retrocardiac consolidation. Small left pleural effusion. No pneumothorax. Osseous demineralization. Thoracic spine degenerative changes. IMPRESSION: Small to moderate left pleural effusion with underlying consolidation which may represent atelectasis or infection. Electronically Signed   By: Annia Belt M.D.   On: 05/26/2017 11:22      IMPRESSION AND PLAN:   GI bleeding. The patient will be admitted to medical floor, discontinue Eliquis, continue Protonix drip, GI consult, n.p.o. with IV fluid support.  Anemia due to acute blood loss. Hemoglobin is 6.2.  The patient got 1 unit PRBC transfusion.  Follow-up hemoglobin in a.m.  Acute respiratory failure with hypoxia due to pneumonia.  CAP.   Continue oxygen per nasal counter, AEB, continue cefepime and vancomycin.  Follow-up CBC and cultures.  Dehydration.  Continue normal saline IV and follow-up BMP.  Elevated troponin.  Possible due to demanding ischemia secondary to above.  Hold Eliquis due to GI bleeding.  Hypertension.  Hold hypertension medication due to above. Dementia.  Aspiration the fall precaution. Severe malnutrition.  Dietitian consult with the patient is stable.. All the records are reviewed and case discussed with ED provider. Management plans discussed with the patient, her daughter and son-in-law and sister and they are in agreement.  CODE STATUS: DNR  TOTAL TIME TAKING CARE  OF THIS PATIENT: 55 minutes.    Shaune Pollack M.D on 05/26/2017 at 3:15 PM  Between 7am to 6pm - Pager - [phone]  After 6pm go to [email or web address] - Social research officer, government  [facility]  Hospitalists  Office  [phone]  CC: Primary care physician; Olive Bass, MD   Note: This dictation was prepared with Dragon dictation along with smaller  phrase technology. Any transcriptional errors that result from this process are unin

## 2017-05-26 NOTE — Progress Notes (Signed)
Mrs Hafen received 2 units PRBCs. Both started in the ED. There was not start time on the green blood document. Neither unit was released. Could not complete blood administration documentation.

## 2017-05-26 NOTE — ED Notes (Signed)
First unit of blood complete at this time. Dr. Don Perking stated that since pt remains waiting for inpatient bed at this time to start 2nd unit of blood.

## 2017-05-26 NOTE — ED Notes (Addendum)
Pt cleaned of stool new brief and chuck placed under pt   Pulse ox moved to forehead d/t not picking up on fingers well. Increased oxygen to 6 L d/t levels at 85%.

## 2017-05-26 NOTE — ED Notes (Addendum)
Pt denies vomiting blood or having bloody stools. Pt states "I hadn't noticed it" when asked how long she had been SOB. Pt states the other day she fell out of bed and cut her ankle, states her daughter wrapped it yesterday.   Dr. Don Perking states pt has had melena. Pt admits to diarrhea recently and cough.

## 2017-05-26 NOTE — ED Notes (Signed)
Second unit of blood verified with Paulino Rily, started at this time.

## 2017-05-26 NOTE — ED Notes (Signed)
Dr. Chen at bedside.

## 2017-05-26 NOTE — ED Triage Notes (Signed)
Arrived by ems from facility with increase respirations, diarrhea.  Low bp by ems.  Pt is in nad.  Lungs with crackles.

## 2017-05-26 NOTE — ED Notes (Signed)
Went to xray by stretcher on 3 liters oxygen. Patient was alert and talking with family members.  Is tachypneic, but able to talk without problem.

## 2017-05-27 LAB — TYPE AND SCREEN
ABO/RH(D): O NEG
Antibody Screen: NEGATIVE
Unit division: 0
Unit division: 0

## 2017-05-27 LAB — PREPARE RBC (CROSSMATCH)

## 2017-05-27 LAB — STREP PNEUMONIAE URINARY ANTIGEN: STREP PNEUMO URINARY ANTIGEN: NEGATIVE

## 2017-05-27 LAB — MRSA PCR SCREENING: MRSA BY PCR: NEGATIVE

## 2017-05-27 LAB — BASIC METABOLIC PANEL
ANION GAP: 9 (ref 5–15)
BUN: 46 mg/dL — ABNORMAL HIGH (ref 6–20)
CALCIUM: 7.9 mg/dL — AB (ref 8.9–10.3)
CO2: 20 mmol/L — AB (ref 22–32)
Chloride: 110 mmol/L (ref 101–111)
Creatinine, Ser: 1.15 mg/dL — ABNORMAL HIGH (ref 0.44–1.00)
GFR calc non Af Amer: 37 mL/min — ABNORMAL LOW (ref >60)
GFR, EST AFRICAN AMERICAN: 43 mL/min — AB (ref >60)
GLUCOSE: 89 mg/dL (ref 65–99)
POTASSIUM: 3.7 mmol/L (ref 3.5–5.1)
Sodium: 139 mmol/L (ref 135–145)

## 2017-05-27 LAB — CBC
HEMATOCRIT: 28.3 % — AB (ref 35.0–47.0)
HEMOGLOBIN: 9.4 g/dL — AB (ref 12.0–16.0)
MCH: 27.5 pg (ref 26.0–34.0)
MCHC: 33.3 g/dL (ref 32.0–36.0)
MCV: 82.5 fL (ref 80.0–100.0)
Platelets: 489 10*3/uL — ABNORMAL HIGH (ref 150–440)
RBC: 3.43 MIL/uL — AB (ref 3.80–5.20)
RDW: 17.7 % — ABNORMAL HIGH (ref 11.5–14.5)
WBC: 12 10*3/uL — ABNORMAL HIGH (ref 3.6–11.0)

## 2017-05-27 LAB — BPAM RBC
BLOOD PRODUCT EXPIRATION DATE: 201905292359
BLOOD PRODUCT EXPIRATION DATE: 201905292359
ISSUE DATE / TIME: 201905091153
ISSUE DATE / TIME: 201905091153
UNIT TYPE AND RH: 5100
Unit Type and Rh: 5100

## 2017-05-27 MED ORDER — MORPHINE SULFATE (PF) 2 MG/ML IV SOLN: 2.0000 mg | INTRAVENOUS | Status: DC | PRN

## 2017-05-27 MED ORDER — MORPHINE SULFATE (CONCENTRATE) 10 MG/0.5ML PO SOLN: 10.0000 mg | ORAL | Status: DC | PRN

## 2017-05-27 NOTE — Clinical Social Work Note (Signed)
Physician met with patient and daughter and they are considering comfort care. Physician does not want to proceed with PT at this time. Will continue to follow for discharge needs.   Annamaria Boots MSW, [place] [phone]

## 2017-05-27 NOTE — Progress Notes (Signed)
[employer] - [place] at [facility]                                                                                                                                                                                  Patient Demographics   Sandra Larson, is a 84 y.o. female, DOB - Apr 14, 1913, ZOX:[id number]  Admit date - 05/26/2017   Admitting Physician Shaune Pollack, MD  Outpatient Primary MD for the patient is Dough, Doris Cheadle, MD   LOS - 1  Subjective: Patient admitted with pneumonia as well as GI bleed.  Daughter at bedside patient awake and alert complains of some pain in the foot  Denies any bleeding   Review of Systems:   CONSTITUTIONAL: No documented fever. No fatigue, weakness. No weight gain, no weight loss.  EYES: No blurry or double vision.  ENT: No tinnitus. No postnasal drip. No redness of the oropharynx.  RESPIRATORY: No cough, no wheeze, no hemoptysis. No dyspnea.  CARDIOVASCULAR: No chest pain. No orthopnea. No palpitations. No syncope.  GASTROINTESTINAL: No nausea, no vomiting or diarrhea. No abdominal pain. No melena or hematochezia.  GENITOURINARY: No dysuria or hematuria.  ENDOCRINE: No polyuria or nocturia. No heat or cold intolerance.  HEMATOLOGY: No anemia. No bruising. No bleeding.  INTEGUMENTARY: No rashes. No lesions.  MUSCULOSKELETAL: No arthritis. No swelling. No gout.  Positive foot pain NEUROLOGIC: No numbness, tingling, or ataxia. No seizure-type activity.  PSYCHIATRIC: No anxiety. No insomnia. No ADD.    Vitals:   Vitals:   05/26/17 1833 05/26/17 1838 05/26/17 2044 05/27/17 0518  BP: (!) 145/57  129/60 139/86  Pulse: 82  95 92  Resp: (!) 22  16 16   Temp: 97.7 F (36.5 C)  99.1 F (37.3 C) 98.5 F (36.9 C)  TempSrc: Oral  Oral Oral  SpO2: 100% 100% 100% 100%  Weight:      Height:        Wt Readings from Last 3 Encounters:  05/26/17 43.5 kg (96 lb)  05/12/17 40.7 kg (89 lb 11.2 oz)  05/03/12 59.5 kg (131 lb 2.8 oz)      Intake/Output Summary (Last 24 hours) at 05/27/2017 1414 Last data filed at 05/27/2017 0519 Gross per 24 hour  Intake 889.58 ml  Output 200 ml  Net 689.58 ml    Physical Exam:   GENERAL: Pleasant-appearing in no apparent distress.  HEAD, EYES, EARS, NOSE AND THROAT: Atraumatic, normocephalic. Extraocular muscles are intact. Pupils equal and reactive to light. Sclerae anicteric. No conjunctival injection. No oro-pharyngeal erythema.  NECK: Supple. There is no jugular venous distention. No bruits, no lymphadenopathy, no thyromegaly.  HEART: Regular rate and rhythm,. No murmurs, no  rubs, no clicks.  LUNGS: Clear to auscultation bilaterally. No rales or rhonchi. No wheezes.  ABDOMEN: Soft, flat, nontender, nondistended. Has good bowel sounds. No hepatosplenomegaly appreciated.  EXTREMITIES: No evidence of any cyanosis, clubbing, or peripheral edema.  +2 pedal and radial pulses bilaterally.  NEUROLOGIC: The patient is alert, awake, and oriented x3 with no focal motor or sensory deficits appreciated bilaterally.  SKIN: Moist and warm with no rashes appreciated.  Psych: Not anxious, depressed LN: No inguinal LN enlargement    Antibiotics   Anti-infectives (From admission, onward)   Start     Dose/Rate Route Frequency Ordered Stop   05/27/17 2200  vancomycin (VANCOCIN) 500 mg in sodium chloride 0.9 % 100 mL IVPB  Status:  Discontinued     500 mg 100 mL/hr over 60 Minutes Intravenous Every 36 hours 05/26/17 1414 05/27/17 1114   05/27/17 1000  ceFEPIme (MAXIPIME) 2 g in sodium chloride 0.9 % 100 mL IVPB  Status:  Discontinued     2 g 200 mL/hr over 30 Minutes Intravenous Every 24 hours 05/26/17 1414 05/27/17 1114   05/26/17 1145  ceFEPIme (MAXIPIME) 1 g in sodium chloride 0.9 % 100 mL IVPB     1 g 200 mL/hr over 30 Minutes Intravenous  Once 05/26/17 1143 05/26/17 1412   05/26/17 1145  vancomycin (VANCOCIN) IVPB 1000 mg/200 mL premix     1,000 mg 200 mL/hr over 60 Minutes Intravenous   Once 05/26/17 1143 05/26/17 1512      Medications   Scheduled Meds: Continuous Infusions: PRN Meds:.acetaminophen **OR** acetaminophen, albuterol, guaiFENesin, morphine injection, morphine CONCENTRATE, ondansetron **OR** ondansetron (ZOFRAN) IV   Data Review:   Micro Results Recent Results (from the past 240 hour(s))  Blood culture (routine x 2)     Status: None (Preliminary result)   Collection Time: 05/26/17 11:52 AM  Result Value Ref Range Status   Specimen Description BLOOD LEFT ANTECUBITAL  Final   Special Requests   Final    BOTTLES DRAWN AEROBIC AND ANAEROBIC Blood Culture adequate volume   Culture   Final    NO GROWTH < 24 HOURS Performed at [facility], [address], [place], [place] [address]    Report Status PENDING  Incomplete  Blood culture (routine x 2)     Status: None (Preliminary result)   Collection Time: 05/26/17 12:21 PM  Result Value Ref Range Status   Specimen Description BLOOD RIGHT ANTECUBITAL  Final   Special Requests   Final    BOTTLES DRAWN AEROBIC AND ANAEROBIC Blood Culture adequate volume   Culture   Final    NO GROWTH < 24 HOURS Performed at [facility], [address], [place], [place] [address]    Report Status PENDING  Incomplete  MRSA PCR Screening     Status: None   Collection Time: 05/27/17 10:23 AM  Result Value Ref Range Status   MRSA by PCR NEGATIVE NEGATIVE Final    Comment:        The GeneXpert MRSA Assay (FDA approved for NASAL specimens only), is one component of a comprehensive MRSA colonization surveillance program. It is not intended to diagnose MRSA infection nor to guide or monitor treatment for MRSA infections. Performed at [facility], [address], [place], [place] [address]     Radiology Reports Dg Chest 1 View  Result Date: 05/12/2017 CLINICAL DATA:  Assisted-living.  Clearance for TB. EXAM: CHEST  1 VIEW COMPARISON:  04/21/2012. FINDINGS: Mediastinum and hilar  structures normal.  Cardiomegaly with normal pulmonary vascularity. Mild left base subsegmental atelectasis. Mild left base pleural thickening most likely scarring. No prominent pleural effusion. No pneumothorax. Degenerative change thoracic spine. Scoliosis thoracic spine. IMPRESSION: Mild basilar atelectasis and pleuroparenchymal scarring. No acute abnormality. No evidence of TB. Electronically Signed   By: Maisie Fus  Register   On: 05/12/2017 12:45   Dg Chest 2 View  Result Date: 05/26/2017 CLINICAL DATA:  Patient with increased respirations. EXAM: CHEST - 2 VIEW COMPARISON:  Chest radiograph 05/12/2017 FINDINGS: Patient is rotated to the left. Stable cardiomegaly. Thoracic aortic vascular calcifications. Retrocardiac consolidation. Small left pleural effusion. No pneumothorax. Osseous demineralization. Thoracic spine degenerative changes. IMPRESSION: Small to moderate left pleural effusion with underlying consolidation which may represent atelectasis or infection. Electronically Signed   By: Annia Belt M.D.   On: 05/26/2017 11:22   Ct Head Wo Contrast  Result Date: 05/11/2017 CLINICAL DATA:  Stroke-like symptoms with right-sided facial droop and leaning to the right. EXAM: CT HEAD WITHOUT CONTRAST TECHNIQUE: Contiguous axial images were obtained from the base of the skull through the vertex without intravenous contrast. COMPARISON:  CT and MR exams from 04/21/2012 FINDINGS: Brain: Remote right posterior parietal lobe infarct with mild ex vacuo dilatation of the adjacent right lateral ventricle. Chronic small vessel ischemic disease noted of the periventricular white matter. No acute intracranial hemorrhage, midline shift or edema. No extra-axial fluid collections. Vascular: Atherosclerosis of the carotid siphons bilaterally. Hyperdense vessel sign. Skull: Negative for fracture or focal lesion. Sinuses/Orbits: No acute finding. Other: None IMPRESSION: Remote right posterior parietal lobe infarct with mild ex  vacuo dilatation of the adjacent right lateral ventricle and mild encephalomalacia. No acute intracranial abnormality. Electronically Signed   By: Tollie Eth M.D.   On: 05/11/2017 21:40   Mr Brain Wo Contrast  Result Date: 05/12/2017 CLINICAL DATA:  RIGHT-sided weakness, leaning toward the RIGHT. History of stroke, atrial fibrillation on anticoagulation, hypertension. EXAM: MRI HEAD WITHOUT CONTRAST TECHNIQUE: Multiplanar, multiecho pulse sequences of the brain and surrounding structures were obtained without intravenous contrast. COMPARISON:  CT HEAD May 11, 2017 and MRI of the head April 21, 2012. FINDINGS: INTRACRANIAL CONTENTS: No reduced diffusion to suggest acute ischemia. No susceptibility artifact to suggest hemorrhage. RIGHT frontoparietal and RIGHT occipital encephalomalacia, worse than prior MRI. Scattered supratentorial white matter FLAIR T2 hyperintensities exclusive of aforementioned abnormality compatible with mild chronic small vessel ischemic disease. No midline shift, mass effect or masses. No advanced parenchymal brain volume loss for age. No hydrocephalus. VASCULAR: Normal major intracranial vascular flow voids present at skull base. Stable 7 x 5 mm A-comm aneurysm. Dolichoectatic intracranial vessels seen with chronic hypertension. SKULL AND UPPER CERVICAL SPINE: No abnormal sellar expansion. No suspicious calvarial bone marrow signal. Craniocervical junction maintained. SINUSES/ORBITS: The mastoid air-cells and included paranasal sinuses are well-aerated.The included ocular globes and orbital contents are non-suspicious. OTHER: None. IMPRESSION: 1. No acute intracranial process. 2. Propagation of old RIGHT MCA territory and RIGHT posterior watershed territory infarcts. 3. Stable 5 x 7 mm A-comm aneurysm. Electronically Signed   By: Awilda Metro M.D.   On: 05/12/2017 00:14   US Venous Img Lower Bilateral  Result Date: 05/11/2017 CLINICAL DATA:  Bilateral lower extremity pain and  difficulty walking today. EXAM: BILATERAL LOWER EXTREMITY VENOUS DOPPLER ULTRASOUND TECHNIQUE: Gray-scale sonography with graded compression, as well as color Doppler and duplex ultrasound were performed to evaluate the lower extremity deep venous systems from the level of the common femoral vein and including the common femoral, femoral, profunda  femoral, popliteal and calf veins including the posterior tibial, peroneal and gastrocnemius veins when visible. The superficial great saphenous vein was also interrogated. Spectral Doppler was utilized to evaluate flow at rest and with distal augmentation maneuvers in the common femoral, femoral and popliteal veins. COMPARISON:  None. FINDINGS: RIGHT LOWER EXTREMITY Common Femoral Vein: No evidence of thrombus. Normal compressibility, respiratory phasicity and response to augmentation. Saphenofemoral Junction: No evidence of thrombus. Normal compressibility and flow on color Doppler imaging. Profunda Femoral Vein: No evidence of thrombus. Normal compressibility and flow on color Doppler imaging. Femoral Vein: No evidence of thrombus. Normal compressibility, respiratory phasicity and response to augmentation. Popliteal Vein: No evidence of thrombus. Normal compressibility, respiratory phasicity and response to augmentation. Calf Veins: No evidence of thrombus. Mild tracking subcutaneous soft tissue edema. Normal compressibility and flow on color Doppler imaging. Superficial Great Saphenous Vein: No evidence of thrombus. Normal compressibility. Venous Reflux:  None. Other Findings:  None. LEFT LOWER EXTREMITY Common Femoral Vein: No evidence of thrombus. Normal compressibility, respiratory phasicity and response to augmentation. Saphenofemoral Junction: No evidence of thrombus. Normal compressibility and flow on color Doppler imaging. Profunda Femoral Vein: No evidence of thrombus. Normal compressibility and flow on color Doppler imaging. Femoral Vein: No evidence of  thrombus. Normal compressibility, respiratory phasicity and response to augmentation. Popliteal Vein: No evidence of thrombus. Normal compressibility, respiratory phasicity and response to augmentation. Calf Veins: Limited assessment due to subcutaneous soft tissue edema in the calf. Superficial Great Saphenous Vein: No evidence of thrombus. Normal compressibility. Venous Reflux:  None. Other Findings:  None. IMPRESSION: No evidence of deep venous thrombosis. Subcutaneous soft tissue edema involving the left calf limits assessment of the calf veins. Electronically Signed   By: Tollie Eth M.D.   On: 05/11/2017 22:14     CBC Recent Labs  Lab 05/26/17 1046 05/27/17 0447  WBC 14.1* 12.0*  HGB 6.2* 9.4*  HCT 20.0* 28.3*  PLT 615* 489*  MCV 82.1 82.5  MCH 25.6* 27.5  MCHC 31.1* 33.3  RDW 18.3* 17.7*  LYMPHSABS 0.8*  --   MONOABS 1.0*  --   EOSABS 0.0  --   BASOSABS 0.0  --     Chemistries  Recent Labs  Lab 05/26/17 1046 05/27/17 0447  NA 136 139  K 4.2 3.7  CL 105 110  CO2 20* 20*  GLUCOSE 154* 89  BUN 59* 46*  CREATININE 1.05* 1.15*  CALCIUM 8.0* 7.9*   ------------------------------------------------------------------------------------------------------------------ estimated creatinine clearance is 17.4 mL/min (A) (by C-G formula based on SCr of 1.15 mg/dL (H)). ------------------------------------------------------------------------------------------------------------------ No results for input(s): HGBA1C in the last 72 hours. ------------------------------------------------------------------------------------------------------------------ No results for input(s): CHOL, HDL, LDLCALC, TRIG, CHOLHDL, LDLDIRECT in the last 72 hours. ------------------------------------------------------------------------------------------------------------------ No results for input(s): TSH, T4TOTAL, T3FREE, THYROIDAB in the last 72 hours.  Invalid input(s):  FREET3 ------------------------------------------------------------------------------------------------------------------ No results for input(s): VITAMINB12, FOLATE, FERRITIN, TIBC, IRON, RETICCTPCT in the last 72 hours.  Coagulation profile No results for input(s): INR, PROTIME in the last 168 hours.  No results for input(s): DDIMER in the last 72 hours.  Cardiac Enzymes Recent Labs  Lab 05/26/17 1046  TROPONINI 0.06*   ------------------------------------------------------------------------------------------------------------------ Invalid input(s): POCBNP    Assessment & Plan  Patient is 84 year old admitted with GI bleeding and pneumonia  GI bleeding. Likely has a chronic GI bleed, I discussed with the patient and the daughter they would not like any further testing to be done at her advanced age and would want to make sure that she is comfortable  Anemia due to acute blood loss. Status post transfusion, no further work-up  Acute respiratory failure with hypoxia due to pneumonia.   I discussed with the patient and the daughter regarding further continued care and they they would like me to stop antibiotics  Dehydration.    Discontinue IV fluid  Elevated troponin.  Possible due to demanding ischemia   Hypertension.  Hold hypertension medication due to above.  Dementia.  Aspiration the fall precaution.       Code Status Orders  (From admission, onward)        Start     Ordered   05/26/17 1856  Do not attempt resuscitation (DNR)  Continuous    Question Answer Comment  In the event of cardiac or respiratory ARREST Do not call a "code blue"   In the event of cardiac or respiratory ARREST Do not perform Intubation, CPR, defibrillation or ACLS   In the event of cardiac or respiratory ARREST Use medication by any route, position, wound care, and other measures to relive pain and suffering. May use oxygen, suction and manual treatment of airway obstruction as  needed for comfort.      05/26/17 1855    Code Status History    Date Active Date Inactive Code Status Order ID Comments User Context   05/12/2017 0052 05/12/2017 2138 Full Code [id number]  Barbaraann Rondo, MD Inpatient   04/25/2012 1615 05/05/2012 1440 DNR [id number]  Charlton Amor, PA-C Inpatient   04/22/2012 0847 04/25/2012 1615 DNR [id number]  Pamella Pert, MD Inpatient    Advance Directive Documentation     Most Recent Value  Type of Advance Directive  Healthcare Power of Attorney, Living will  Pre-existing out of facility DNR order (yellow form or pink MOST form)  -  "MOST" Form in Place?  -           Consults none  DVT Prophylaxis comfort measures only Lab Results  Component Value Date   PLT 489 (H) 05/27/2017     Time Spent in minutes   35 minutes spent greater than 50% of time spent in care coordination and counseling patient regarding the condition and plan of care.   Auburn Bilberry M.D on 05/27/2017 at 2:14 PM  Between 7am to 6pm - Pager - [phone]  After 6pm go to [email or web address] - Social research officer, government  [employer]   Office  [phone]

## 2017-05-27 NOTE — Progress Notes (Addendum)
Initial Nutrition Assessment  DOCUMENTATION CODES:   Severe malnutrition in context of chronic illness  INTERVENTION:   RD will order supplements when diet advanced; Recommend  Magic cup TID with meals, each supplement provides 290 kcal and 9 grams of protein  Vital Cuisine TID, each supplement provides 520kcal and 22g of protein.   Liberal diet   MVI daily  Recommend check B12 labs  NUTRITION DIAGNOSIS:   Severe Malnutrition related to chronic illness(chronic poor appetite, advanced age ) as evidenced by severe fat depletion, severe muscle depletion.  GOAL:   Patient will meet greater than or equal to 90% of their needs  MONITOR:   PO intake, Supplement acceptance, Labs, Weight trends, Skin, I & O's  REASON FOR ASSESSMENT:   Malnutrition Screening Tool    ASSESSMENT:   84 y.o. female with a known history of A. fib, HTN, HLD, CKD III, GERD, glaucoma, macular degeneration, on Eliquis, CVA, hypertension and dementia admitted with PNA and GIB    RD familiar with this pt from previous admit 2 weeks ago. Pt is a poor historian so history previously obtained from pt's daughter. Pt with poor appetite and oral intake at baseline. Pt mainly likes bread and meat such as biscuits and sandwiches. Pt is unable to tolerate Ensure/Boost as they give her diarrhea. Pt with h/o chronic diarrhea. Per chart, pt with weight gain since last admit. Pt does not have any trouble chewing or swallowing per family. RD will order supplements to help pt meet her estimated needs when diet advanced. Recommend liberal diet to encourage intake. Pt noted to have iron deficiency anemia; recommend check B12 labs also.  Medications reviewed and include: protonix, cefepime, vancomycin  Labs reviewed: BUN 46(H), creat 1.15(H) Wbc- 12.0(H), Hgb 9.4(L), Hct 28.3(L) P 3.5 wnl, Mg 1.6(L)- 4/24 Prealbumin- 21.4- 4/25 Iron 19(L), ferritin 7(L), TIBC 347- 4/24  Nutrition-Focused physical exam completed.  Findings are severe fat and  muscle depletions over entire body, and mild edema.   Diet Order:   Diet Order           Diet NPO time specified Except for: Sips with Meds  Diet effective now          EDUCATION NEEDS:   No education needs have been identified at this time  Skin:  Skin Assessment: Reviewed RN Assessment(skin tear ankle )  Last BM:  5/8  Height:   Ht Readings from Last 1 Encounters:  05/26/17  (1.575 m)    Weight:   Wt Readings from Last 1 Encounters:  05/26/17 96 lb (43.5 kg)    Ideal Body Weight:  50 kg  BMI:  Body mass index is 17.56 kg/m.  Estimated Nutritional Needs:   Kcal:  1000-1200kcal/day   Protein:  60-69g/day   Fluid:  >1L/day   Betsey Holiday MS, RD, LDN Pager #[phone] After Hours Pager: [phone]

## 2017-05-27 NOTE — NC FL2 (Addendum)
[place] MEDICAID FL2 LEVEL OF CARE SCREENING TOOL     IDENTIFICATION  Patient Name: Sandra Larson Birthdate: 1913-08-19 Sex: female Admission Date (Current Location): 05/26/2017  [facility] and [place] Number:  Chiropodist and Address:  [facility], [address], [place], [place] [address]      Provider Number: [id number]  Attending Physician Name and Address:  Auburn Bilberry, MD  Relative Name and Phone Number:       Current Level of Care: Hospital Recommended Level of Care: Assisted Living Facility Prior Approval Number:    Date Approved/Denied:   PASRR Number:    Discharge Plan: Domiciliary (Rest home)    Current Diagnoses: Patient Active Problem List   Diagnosis Date Noted  . GIB (gastrointestinal bleeding) 05/26/2017  . Protein-calorie malnutrition, severe 05/12/2017  . Weakness 05/11/2017  . 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 Atrial fibrillation (HCC) 04/25/2012  . CVA (cerebral infarction) 04/21/2012  . HTN (hypertension) 04/21/2012    Orientation RESPIRATION BLADDER Height & Weight     Self, Time, Place  O2(3 liters) Incontinent Weight: 96 lb (43.5 kg) Height:   (157.5 cm)  BEHAVIORAL SYMPTOMS/MOOD NEUROLOGICAL BOWEL NUTRITION STATUS  (None) (None) Continent Diet(NPO )  AMBULATORY STATUS COMMUNICATION OF NEEDS Skin   Limited Assist Verbally                         Personal Care Assistance Level of Assistance  Bathing, Feeding, Dressing Bathing Assistance: Limited assistance Feeding assistance: Independent Dressing Assistance: Limited assistance     Functional Limitations Info    Sight Info: Adequate Hearing Info: Adequate Speech Info: Adequate    SPECIAL CARE FACTORS FREQUENCY  [facility] to follow                    Contractures Contractures Info: Not present    Additional Factors Info  Code Status, Allergies Code Status Info: DNR Allergies Info: Codeine,  Penicillins, Sulfa Antibiotics             DISCHARGE MEDICATIONS:        Allergies as of 05/30/2017      Reactions   Codeine Nausea And Vomiting   Penicillins    chilhood   Sulfa Antibiotics Rash                     Medication List           STOP taking these medications          losartan 100 MG tablet Commonly known as:  COZAAR   multivitamin with minerals Tabs tablet   simvastatin 20 MG tablet Commonly known as:  ZOCOR   traMADol 50 MG tablet Commonly known as:  ULTRAM   XARELTO 15 MG Tabs tablet Generic drug:  Rivaroxaban                   TAKE these medications          acetaminophen 325 MG tablet Commonly known as:  TYLENOL Take 1-2 tablets (325-650 mg total) by mouth every 4 (four) hours as needed.   dorzolamide-timolol 22.3-6.8 MG/ML ophthalmic solution Commonly known as:  COSOPT Place 1 drop into both eyes 2 (two) times daily.   latanoprost 0.005 % ophthalmic solution Commonly known as:  XALATAN Place 1 drop into both eyes at bedtime.   morphine CONCENTRATE 10 MG/0.5ML Soln concentrated solution Take 0.5 mLs (10 mg total)  by mouth every 2 (two) hours as needed for moderate pain.

## 2017-05-27 NOTE — Progress Notes (Signed)
Advanced care plan.  Purpose of the Encounter:  Plan of treatment  Parties in Attendance:patient and daughter Patient's Decision Capacity:intact   Subjective/Patient's story: Patient is 84 year old with history of atrial fibrillation chronic diarrhea, essential hypertension dementia who is admitted with pneumonia and a possible GI bleed. Patient is DNR.  However daughter and patient brings up the question of her current care.  Objective/Medical story I discussed with patient and her daughter regarding her desires for aggressive therapy.  Patient had and her daughter do not wish for aggressive care.  They do not want her to be on antibiotics IV fluids.  I have canceled GI evaluation stopped all the antibiotics.   Goals of care determination: None aggressive medical therapy    CODE STATUS: DNR   Time spent discussing advanced care planning: 16 minutes

## 2017-05-27 NOTE — Care Management (Addendum)
It appears patient is from the [facility]. Attempted to leave vm message with facility to confirm but there is no answer and no prompt that would allow a voicemail.  Notified CSW.  GI consult pending. Hemoglobin 6.2 and has received transfusion

## 2017-05-27 NOTE — Clinical Social Work Note (Signed)
CSW noted through chart review that patient is from [facility] [address] CSW spoke with Amber from [facility] and patient is from there and can return when ready. CSW will continue to follow for discharge planning needs.   Ruthe Mannan MSW, [address] [phone]

## 2017-05-27 NOTE — Clinical Social Work Note (Signed)
CSW contacted Hospital doctor at [facility] to find out patient's baseline before hospital admission. Per [facility] patient needed a lot of assistance at baseline but was able to walk with a walker and some assist. She also reports that patient needed assistance with all ADLs. CSW will request a PT evaluation for recommendation.   Ruthe Mannan MSW, LCSWA [phone]

## 2017-05-28 NOTE — Progress Notes (Signed)
Patient ID: Sandra Larson, female   DOB: 1913/12/02, 84 y.o.   MRN: [id number]  [facility] PROGRESS NOTE  NAKYRA BOURN [name]:[id number] DOB: [birth year] DOA: 05/26/2017 PCP: Olive Bass, MD  HPI/Subjective: Patient feeling okay.  Some cough.  Some shortness of breath.  Feeling weak.   Objective: Vitals:   05/28/17 0627 05/28/17 1252  BP: (!) 142/63 127/75  Pulse: 88 95  Resp: 18   Temp: 98 F (36.7 C) 98.2 F (36.8 C)  SpO2: 97% 100%    Filed Weights   05/26/17 1030  Weight: 43.5 kg (96 lb)    ROS: Review of Systems  Constitutional: Positive for malaise/fatigue. Negative for chills and fever.  Eyes: Negative for blurred vision.  Respiratory: Positive for cough and shortness of breath.   Cardiovascular: Negative for chest pain.  Gastrointestinal: Negative for abdominal pain, constipation, diarrhea, nausea and vomiting.  Genitourinary: Negative for dysuria.  Musculoskeletal: Negative for joint pain.  Neurological: Negative for dizziness and headaches.   Exam: Physical Exam  HENT:  Nose: No mucosal edema.  Mouth/Throat: No oropharyngeal exudate or posterior oropharyngeal edema.  Eyes: Pupils are equal, round, and reactive to light. Conjunctivae, EOM and lids are normal.  Neck: No JVD present. Carotid bruit is not present. No edema present. No thyroid mass and no thyromegaly present.  Cardiovascular: S1 normal and S2 normal. Exam reveals no gallop.  No murmur heard. Pulses:      Dorsalis pedis pulses are 2+ on the right side, and 2+ on the left side.  Respiratory: No respiratory distress. She has decreased breath sounds in the right lower field and the left lower field. She has no wheezes. She has rhonchi in the right lower field and the left lower field. She has no rales.  GI: Soft. Bowel sounds are normal. There is no tenderness.  Musculoskeletal:       Right ankle: She exhibits no swelling.       Left ankle: She exhibits no swelling.   Lymphadenopathy:    She has no cervical adenopathy.  Neurological: She is alert. No cranial nerve deficit.  Skin: Skin is warm. No rash noted. Nails show no clubbing.  Psychiatric: She has a normal mood and affect.      Data Reviewed: Basic Metabolic Panel: Recent Labs  Lab 05/26/17 1046 05/27/17 0447  NA 136 139  K 4.2 3.7  CL 105 110  CO2 20* 20*  GLUCOSE 154* 89  BUN 59* 46*  CREATININE 1.05* 1.15*  CALCIUM 8.0* 7.9*   CBC: Recent Labs  Lab 05/26/17 1046 05/27/17 0447  WBC 14.1* 12.0*  NEUTROABS 12.3*  --   HGB 6.2* 9.4*  HCT 20.0* 28.3*  MCV 82.1 82.5  PLT 615* 489*   Cardiac Enzymes: Recent Labs  Lab 05/26/17 1046  TROPONINI 0.06*     Recent Results (from the past 240 hour(s))  Blood culture (routine x 2)     Status: None (Preliminary result)   Collection Time: 05/26/17 11:52 AM  Result Value Ref Range Status   Specimen Description BLOOD LEFT ANTECUBITAL  Final   Special Requests   Final    BOTTLES DRAWN AEROBIC AND ANAEROBIC Blood Culture adequate volume   Culture   Final    NO GROWTH 2 DAYS Performed at [facility], [address] [place], [place] [address]    Report Status PENDING  Incomplete  Blood culture (routine x 2)     Status: None (Preliminary result)   Collection Time:  05/26/17 12:21 PM  Result Value Ref Range Status   Specimen Description BLOOD RIGHT ANTECUBITAL  Final   Special Requests   Final    BOTTLES DRAWN AEROBIC AND ANAEROBIC Blood Culture adequate volume   Culture   Final    NO GROWTH 2 DAYS Performed at [facility], [address] [place], [place] [address]    Report Status PENDING  Incomplete  MRSA PCR Screening     Status: None   Collection Time: 05/27/17 10:23 AM  Result Value Ref Range Status   MRSA by PCR NEGATIVE NEGATIVE Final    Comment:        The GeneXpert MRSA Assay (FDA approved for NASAL specimens only), is one component of a comprehensive MRSA colonization surveillance  program. It is not intended to diagnose MRSA infection nor to guide or monitor treatment for MRSA infections. Performed at [facility], [address], [place], [place] [address]       Assessment/Plan:  1. Acute hypoxic respiratory failure secondary to pneumonia.  Continue oxygen supplementation and try to taper if possible. 2. Pneumonia.  Family wanted to stop antibiotics and made comfort care. 3. Chronic GI bleed.  No further testing.  Last hemoglobin 9.4. 4. Acute kidney injury. 5. History of essential hypertension 6. History of glaucoma  Case discussed with daughter and son-in-law at the bedside.  they are interested in comfort measures at this point.  I discussed options of hospice home.  Currently there are no beds available at the hospice home.  Another option would be go back to her assisted living with hospice.  Another option would be to go to rehab with palliative care following.  Family is leaning towards either hospice home or rehab with palliative care.  Code Status:     Code Status Orders  (From admission, onward)        Start     Ordered   05/26/17 1856  Do not attempt resuscitation (DNR)  Continuous    Question Answer Comment  In the event of cardiac or respiratory ARREST Do not call a "code blue"   In the event of cardiac or respiratory ARREST Do not perform Intubation, CPR, defibrillation or ACLS   In the event of cardiac or respiratory ARREST Use medication by any route, position, wound care, and other measures to relive pain and suffering. May use oxygen, suction and manual treatment of airway obstruction as needed for comfort.      05/26/17 1855    Code Status History    Date Active Date Inactive Code Status Order ID Comments User Context   05/12/2017 0052 05/12/2017 2138 Full Code [id number]  Barbaraann Rondo, MD Inpatient   04/25/2012 1615 05/05/2012 1440 DNR [id number]  Lynnae Prude Inpatient   04/22/2012 0847 04/25/2012 1615 DNR  [id number]  Pamella Pert, MD Inpatient    Advance Directive Documentation     Most Recent Value  Type of Advance Directive  Healthcare Power of Attorney, Living will  Pre-existing out of facility DNR order (yellow form or pink MOST form)  -  "MOST" Form in Place?  -     Family Communication: Daughter at the bedside Disposition Plan: To be determined  Time spent: 30 minutes  Lavora Brisbon [employer]

## 2017-05-28 NOTE — Clinical Social Work Note (Signed)
CSW had a discussion with the attending MD about options for care (Hospice Home vs. ALF with Hospice vs. SNF with Palliative Care). The attending MD shared that the patient's family is in agreement with comfort care and gave permission for a referral to [facility]. The CSW has sent the referral for hospice home level of care. Currently, no beds are available. The CSW will update as more information becomes available.  Argentina Ponder, MSW, Theresia Majors [phone]

## 2017-05-29 NOTE — Evaluation (Signed)
Physical Therapy Evaluation Patient Details Name: Sandra Larson MRN: [id number] DOB: 01-01-1914 Today's Date: 05/29/2017   History of Present Illness  84 yo female with onset of recent medical issues of GIB with transfusion, hypoxia, atelectasis, PNA, AKI and respiratory failure.  Has family support in her home but lives alone.  PMHx:  scoliosis with L rotation, cardiomegaly, atherosclerosis, DJD thoracic spine, glaucoma, HTN  Clinical Impression  Pt is up to move on walker with assistance and is a good candidate for gentle mobility and up to the chair.  Talked with daughter about PLOF and note that pt is in need of some more protective care.  Pt is likely LTC or ALF level based on outcome from light rehabilitation if accepted.  Follow acutely for these objectives and will hopefully shorten the rehab needs at SNF.    Follow Up Recommendations SNF    Equipment Recommendations  Rolling walker with 5" wheels    Recommendations for Other Services       Precautions / Restrictions Precautions Precautions: Fall(monitor vitals with pulse and O2 sat) Restrictions Weight Bearing Restrictions: No      Mobility  Bed Mobility Overal bed mobility: Needs Assistance Bed Mobility: Supine to Sit     Supine to sit: Min assist;Mod assist     General bed mobility comments: assisted under trunk then sliding with bed pad ,and time to get her balance bedside  Transfers Overall transfer level: Needs assistance Equipment used: Rolling walker (2 wheeled);1 person hand held assist Transfers: Sit to/from Stand Sit to Stand: Mod assist         General transfer comment: dense cues for hand placement and sequence  Ambulation/Gait Ambulation/Gait assistance: Min assist Ambulation Distance (Feet): 35 Feet Assistive device: Rolling walker (2 wheeled);1 person hand held assist Gait Pattern/deviations: Step-to pattern;Step-through pattern;Decreased stride length;Trunk flexed;Drifts  right/left Gait velocity: reduced Gait velocity interpretation: <1.8 ft/sec, indicate of risk for recurrent falls    Stairs            Wheelchair Mobility    Modified Rankin (Stroke Patients Only)       Balance Overall balance assessment: Needs assistance Sitting-balance support: Bilateral upper extremity supported;Feet supported Sitting balance-Leahy Scale: Fair Sitting balance - Comments: fair once set   Standing balance support: Bilateral upper extremity supported Standing balance-Leahy Scale: Poor                               Pertinent Vitals/Pain Pain Assessment: No/denies pain    Home Living Family/patient expects to be discharged to:: Skilled nursing facility Living Arrangements: Children               Additional Comments: per daughter the pt gets assistance of food prepared by family and was up on a walker with diffficulty just before the hospital    Prior Function Level of Independence: Needs assistance   Gait / Transfers Assistance Needed: RW with no assistance until just recently  ADL's / Homemaking Assistance Needed: family assists with cooking and cleaning  Comments: family has been noting gradual increasing weakness     Hand Dominance        Extremity/Trunk Assessment   Upper Extremity Assessment Upper Extremity Assessment: Generalized weakness    Lower Extremity Assessment Lower Extremity Assessment: Generalized weakness    Cervical / Trunk Assessment Cervical / Trunk Assessment: Kyphotic  Communication   Communication: No difficulties  Cognition Arousal/Alertness: Awake/alert Behavior During Therapy: Impulsive;Flat affect  Overall Cognitive Status: Difficult to assess                                 General Comments: family was not able to observe the interactions with pt      General Comments      Exercises     Assessment/Plan    PT Assessment Patient needs continued PT services  PT  Problem List Decreased strength;Decreased range of motion;Decreased activity tolerance;Decreased balance;Decreased mobility;Decreased coordination;Decreased knowledge of use of DME;Decreased safety awareness;Decreased knowledge of precautions;Cardiopulmonary status limiting activity       PT Treatment Interventions DME instruction;Gait training;Stair training;Functional mobility training;Therapeutic activities;Therapeutic exercise;Balance training;Neuromuscular re-education;Patient/family education    PT Goals (Current goals can be found in the Care Plan section)  Acute Rehab PT Goals Patient Stated Goal: none stated PT Goal Formulation: With family Potential to Achieve Goals: Good    Frequency Min 2X/week   Barriers to discharge Inaccessible home environment;Decreased caregiver support home alone with limited family assistance    Co-evaluation               AM-PAC PT "6 Clicks" Daily Activity  Outcome Measure Difficulty turning over in bed (including adjusting bedclothes, sheets and blankets)?: A Little Difficulty moving from lying on back to sitting on the side of the bed? : Unable Difficulty sitting down on and standing up from a chair with arms (e.g., wheelchair, bedside commode, etc,.)?: Unable Help needed moving to and from a bed to chair (including a wheelchair)?: A Little Help needed walking in hospital room?: A Little Help needed climbing 3-5 steps with a railing? : A Lot 6 Click Score: 13    End of Session Equipment Utilized During Treatment: Gait belt Activity Tolerance: Patient tolerated treatment well;Patient limited by fatigue Patient left: in chair;with call bell/phone within reach;with chair alarm set;with family/visitor present Nurse Communication: Mobility status PT Visit Diagnosis: Unsteadiness on feet (R26.81);Muscle weakness (generalized) (M62.81);Difficulty in walking, not elsewhere classified (R26.2)    Time: 1610-9604 PT Time Calculation (min)  (ACUTE ONLY): 27 min   Charges:   PT Evaluation $PT Eval Moderate Complexity: 1 Mod PT Treatments $Gait Training: 8-22 mins   PT G Codes:   PT G-Codes **NOT FOR INPATIENT CLASS** Functional Assessment Tool Used: AM-PAC 6 Clicks Basic Mobility    Ivar Drape 05/29/2017, 11:33 PM   Samul Dada, PT MS Acute Rehab Dept. Number: [facility] [phone] and [facility] [phone]

## 2017-05-29 NOTE — Progress Notes (Signed)
Patient ID: Sandra Larson, female   DOB: 1913-03-06, 84 y.o.   MRN: [id number]  [facility] PROGRESS NOTE  Sandra Larson:[id number] DOB: 1913-05-26 DOA: 05/26/2017 PCP: Olive Bass, MD  HPI/Subjective: Patient states she is still here.  Some cough and shortness of breath.  Feels weak.  Objective: Vitals:   05/29/17 1211 05/29/17 1417  BP:  (!) 99/48  Pulse:  100  Resp:  (!) 28  Temp:  98.2 F (36.8 C)  SpO2: 97% 97%    Filed Weights   05/26/17 1030  Weight: 43.5 kg (96 lb)    ROS: Review of Systems  Constitutional: Positive for malaise/fatigue. Negative for chills and fever.  Eyes: Negative for blurred vision.  Respiratory: Positive for cough and shortness of breath.   Cardiovascular: Negative for chest pain.  Gastrointestinal: Negative for abdominal pain, constipation, diarrhea, nausea and vomiting.  Genitourinary: Negative for dysuria.  Musculoskeletal: Negative for joint pain.  Neurological: Negative for dizziness and headaches.   Exam: Physical Exam  HENT:  Nose: No mucosal edema.  Mouth/Throat: No oropharyngeal exudate or posterior oropharyngeal edema.  Eyes: Pupils are equal, round, and reactive to light. Conjunctivae, EOM and lids are normal.  Neck: No JVD present. Carotid bruit is not present. No edema present. No thyroid mass and no thyromegaly present.  Cardiovascular: S1 normal and S2 normal. Exam reveals no gallop.  No murmur heard. Pulses:      Dorsalis pedis pulses are 2+ on the right side, and 2+ on the left side.  Respiratory: No respiratory distress. She has decreased breath sounds in the right lower field and the left lower field. She has no wheezes. She has rhonchi in the right lower field and the left lower field. She has no rales.  GI: Soft. Bowel sounds are normal. There is no tenderness.  Musculoskeletal:       Right ankle: She exhibits no swelling.       Left ankle: She exhibits no swelling.  Lymphadenopathy:    She has  no cervical adenopathy.  Neurological: She is alert. No cranial nerve deficit.  Skin: Skin is warm. No rash noted. Nails show no clubbing.  Psychiatric: She has a normal mood and affect.      Data Reviewed: Basic Metabolic Panel: Recent Labs  Lab 05/26/17 1046 05/27/17 0447  NA 136 139  K 4.2 3.7  CL 105 110  CO2 20* 20*  GLUCOSE 154* 89  BUN 59* 46*  CREATININE 1.05* 1.15*  CALCIUM 8.0* 7.9*   CBC: Recent Labs  Lab 05/26/17 1046 05/27/17 0447  WBC 14.1* 12.0*  NEUTROABS 12.3*  --   HGB 6.2* 9.4*  HCT 20.0* 28.3*  MCV 82.1 82.5  PLT 615* 489*   Cardiac Enzymes: Recent Labs  Lab 05/26/17 1046  TROPONINI 0.06*     Recent Results (from the past 240 hour(s))  Blood culture (routine x 2)     Status: None (Preliminary result)   Collection Time: 05/26/17 11:52 AM  Result Value Ref Range Status   Specimen Description BLOOD LEFT ANTECUBITAL  Final   Special Requests   Final    BOTTLES DRAWN AEROBIC AND ANAEROBIC Blood Culture adequate volume   Culture   Final    NO GROWTH 3 DAYS Performed at [facility], [address], [place], [place] [address]    Report Status PENDING  Incomplete  Blood culture (routine x 2)     Status: None (Preliminary result)   Collection Time: 05/26/17  12:21 PM  Result Value Ref Range Status   Specimen Description BLOOD RIGHT ANTECUBITAL  Final   Special Requests   Final    BOTTLES DRAWN AEROBIC AND ANAEROBIC Blood Culture adequate volume   Culture   Final    NO GROWTH 3 DAYS Performed at [facility], [address] [place], [place] [address]    Report Status PENDING  Incomplete  MRSA PCR Screening     Status: None   Collection Time: 05/27/17 10:23 AM  Result Value Ref Range Status   MRSA by PCR NEGATIVE NEGATIVE Final    Comment:        The GeneXpert MRSA Assay (FDA approved for NASAL specimens only), is one component of a comprehensive MRSA colonization surveillance program. It is not intended to  diagnose MRSA infection nor to guide or monitor treatment for MRSA infections. Performed at [facility], [address], [place], [place] [address]       Assessment/Plan:  1. Acute hypoxic respiratory failure secondary to pneumonia.  Patient tapered to room air. 2. Pneumonia.  Family wanted to stop antibiotics and made comfort care. 3. Chronic GI bleed.  No further testing.  Last hemoglobin 9.4. 4. Acute kidney injury. 5. History of essential hypertension 6. History of glaucoma  Case discussed with daughter and son-in-law at the bedside.  they are interested in comfort measures at this point.  I discussed options of hospice home.  Currently there are no beds available at the hospice home.  Another option would be go back to her assisted living with hospice Which they do not want to do at this point.  Another option would be to go to rehab with palliative care following.  Family is leaning towards either hospice home or rehab with palliative care.  Code Status:     Code Status Orders  (From admission, onward)        Start     Ordered   05/26/17 1856  Do not attempt resuscitation (DNR)  Continuous    Question Answer Comment  In the event of cardiac or respiratory ARREST Do not call a "code blue"   In the event of cardiac or respiratory ARREST Do not perform Intubation, CPR, defibrillation or ACLS   In the event of cardiac or respiratory ARREST Use medication by any route, position, wound care, and other measures to relive pain and suffering. May use oxygen, suction and manual treatment of airway obstruction as needed for comfort.      05/26/17 1855    Code Status History    Date Active Date Inactive Code Status Order ID Comments User Context   05/12/2017 0052 05/12/2017 2138 Full Code [id number]  Barbaraann Rondo, MD Inpatient   04/25/2012 1615 05/05/2012 1440 DNR [id number]  Lynnae Prude Inpatient   04/22/2012 0847 04/25/2012 1615 DNR [id number]  Pamella Pert,  MD Inpatient    Advance Directive Documentation     Most Recent Value  Type of Advance Directive  Healthcare Power of Attorney, Living will  Pre-existing out of facility DNR order (yellow form or pink MOST form)  -  "MOST" Form in Place?  -     Family Communication: Family at bedside Disposition Plan: To be determined  Time spent: 30 minutes, including ACP time  [employer]

## 2017-05-29 NOTE — Progress Notes (Signed)
Patient ID: Sandra Larson, female   DOB: 12-Oct-1913, 84 y.o.   MRN: [id number]  ACP note  Family wanted to talk to me outside the room.  They are interested in hospice home versus rehab with palliative following.  Currently no beds at the hospice home.  Potentially will look into rehab with palliative care following with conversion to hospice if the patient declines.  Patient is already a DNR.  We stopped treatment for pneumonia.  Not evaluating GI bleed.  Diagnosis: acute respiratory failure with hypoxia, pneumonia, GI bleed  Time spent on ACP discussion 17 minutes  Dr. Alford Highland

## 2017-05-30 MED ORDER — MORPHINE SULFATE (CONCENTRATE) 10 MG/0.5ML PO SOLN: 10.0000 mg | ORAL | 0 refills | Status: AC | PRN

## 2017-05-30 NOTE — Clinical Social Work Note (Signed)
CSW spoke with patient's daughter outside of patient's room and daughter states that between the options available: go to rehab with palliative, return to the [facility] with hospice, or return to the [facility] with hospice while looking into potentially another assisted living facility, patient's daughter chooses return to the [facility] with hospice. CSW informed patient's daughter that I will speak with patient to see what she is wanting. The daughter waited outside of her mother's room. CSW met with patient and presented the options. Patient states she really does not want to go to rehab and endure therapy daily. Patient stated if she cannot return home (to her actual home) then she would do what her daughter prefers. Patient stated that she did not mind returning to [facility] [place]. CSW updated patient's daughter and the physician. CSW notified Santiago Glad with Hospice as patient will need a hospital bed. Shela Leff MSW,LCSW [phone]

## 2017-05-30 NOTE — Progress Notes (Signed)
EMS called for transportation.  

## 2017-05-30 NOTE — Progress Notes (Signed)
New hospice home referral received on 5/12. Chart notes reviewed, spoke with staff. Patient is not at this time appropriate for the hospice home. She has not required any symptom management and has a fair appetite. She was able to work with physical therapy yesterday and walked 35 feet. She does not appear to have a two week or less prognosis. She is appropriate for hospice services at [facility] or at home. If she discharges to SNF for rehab, would recommend out patient PALLIATIVE to follow.CSW York Spaniel and attending physician Dr. Renae Gloss made aware. made aware. Referral made aware. Dayna Barker RN, BSN, [facility] [place], [facility] and [facility] [place], hospital liaison [phone]

## 2017-05-30 NOTE — Care Management Important Message (Signed)
Copy of signed IM left in patient's room.    

## 2017-05-30 NOTE — Progress Notes (Signed)
EMS here to transfer patient. VS stable. IV removed Patient helped into stretcher.

## 2017-05-30 NOTE — Discharge Summary (Signed)
[facility] [place] [facility]   PATIENT NAME: Sandra Larson    MR#:  [id number]  DATE OF BIRTH:  1913/12/15  DATE OF ADMISSION:  05/26/2017 ADMITTING PHYSICIAN: Shaune Pollack, MD  DATE OF DISCHARGE: 05/30/2017  PRIMARY CARE PHYSICIAN: Olive Bass, MD    ADMISSION DIAGNOSIS:  Shortness of breath [R06.02] Acute blood loss anemia [D62] Upper GI bleed [K92.2] HCAP (healthcare-associated pneumonia) [J18.9]  DISCHARGE DIAGNOSIS:  Active Problems:   GIB (gastrointestinal bleeding)   SECONDARY DIAGNOSIS:   Past Medical History:  Diagnosis Date  . Glaucoma   . Hypertension     HOSPITAL COURSE:   1.  Acute hypoxic respiratory failure secondary to pneumonia.  Patient actually tapered over to room air. 2.  Pneumonia.  Family wanted to stop antibiotics on Friday.  Patient has not declined with stopping of antibiotics. 3.  Chronic GI bleed.  Acute blood loss anemia.  No further testing at this point.  Hemoglobin 9.4.  When she came in hemoglobin was 6.2.  Patient was given blood transfusions. 4.  Acute kidney injury on chronic kidney disease stage III.  Improved a little bit with IV fluids. 5.  Chronic atrial fibrillation.  Xarelto held.  6.  Essential hypertension 7.  History of glaucoma 8.  Failure to thrive.  Patient at this point is not a candidate for hospice home.  Patient is a candidate for hospice services back at her facility.  I did prescribe PRN Roxanol for if she declines.  DISCHARGE CONDITIONS:   Fair  CONSULTS OBTAINED:  None  DRUG ALLERGIES:   Allergies  Allergen Reactions  . Codeine Nausea And Vomiting  . Penicillins     chilhood  . Sulfa Antibiotics Rash    DISCHARGE MEDICATIONS:   Allergies as of 05/30/2017      Reactions   Codeine Nausea And Vomiting   Penicillins    chilhood   Sulfa Antibiotics Rash      Medication List    STOP taking these medications   losartan 100 MG tablet Commonly known as:  COZAAR    multivitamin with minerals Tabs tablet   simvastatin 20 MG tablet Commonly known as:  ZOCOR   traMADol 50 MG tablet Commonly known as:  ULTRAM   XARELTO 15 MG Tabs tablet Generic drug:  Rivaroxaban     TAKE these medications   acetaminophen 325 MG tablet Commonly known as:  TYLENOL Take 1-2 tablets (325-650 mg total) by mouth every 4 (four) hours as needed.   dorzolamide-timolol 22.3-6.8 MG/ML ophthalmic solution Commonly known as:  COSOPT Place 1 drop into both eyes 2 (two) times daily.   latanoprost 0.005 % ophthalmic solution Commonly known as:  XALATAN Place 1 drop into both eyes at bedtime.   morphine CONCENTRATE 10 MG/0.5ML Soln concentrated solution Take 0.5 mLs (10 mg total) by mouth every 2 (two) hours as needed for moderate pain.            Durable Medical Equipment  (From admission, onward)        Start     Ordered   05/30/17 1144  For home use only DME Hospital bed  Once    Question Answer Comment  Patient has (list medical condition): dx failure to thrive   The above medical condition requires: Patient requires the ability to reposition frequently   Head must be elevated greater than: 30 degrees   Bed type Semi-electric      05/30/17 1144  DISCHARGE INSTRUCTIONS:   Follow-up with hospice at facility 1 day   If you experience worsening of your admission symptoms, develop shortness of breath, life threatening emergency, suicidal or homicidal thoughts you must seek medical attention immediately by calling 911 or calling your MD immediately  if symptoms less severe.  You Must read complete instructions/literature along with all the possible adverse reactions/side effects for all the Medicines you take and that have been prescribed to you. Take any new Medicines after you have completely understood and accept all the possible adverse reactions/side effects.   Please note  You were cared for by a hospitalist during your hospital stay. If you  have any questions about your discharge medications or the care you received while you were in the hospital after you are discharged, you can call the unit and asked to speak with the hospitalist on call if the hospitalist that took care of you is not available. Once you are discharged, your primary care physician will handle any further medical issues. Please note that NO REFILLS for any discharge medications will be authorized once you are discharged, as it is imperative that you return to your primary care physician (or establish a relationship with a primary care physician if you do not have one) for your aftercare needs so that they can reassess your need for medications and monitor your lab values.    Today   CHIEF COMPLAINT:   Chief Complaint  Patient presents with  . Shortness of Breath    HISTORY OF PRESENT ILLNESS:  Sandra Larson  is a 84 y.o. female presented with shortness of breath found to be anemic with GI bleed and also pneumonia   VITAL SIGNS:  Blood pressure 128/71, pulse 91, temperature 97.7 F (36.5 C), temperature source Oral, resp. rate (!) 22, height  (1.575 m), weight 43.5 kg (96 lb), SpO2 98 %.   PHYSICAL EXAMINATION:  GENERAL:  84 y.o.-year-old patient lying in the bed with no acute distress.  EYES: Pupils equal, round, reactive to light and accommodation. No scleral icterus. Extraocular muscles intact.  HEENT: Head atraumatic, normocephalic. Oropharynx and nasopharynx clear.  NECK:  Supple, no jugular venous distention. No thyroid enlargement, no tenderness.  LUNGS:  decreased breath sounds bilateral bases, no wheezing, rales,rhonchi or crepitation. No use of accessory muscles of respiration.  CARDIOVASCULAR: S1, S2 normal. No murmurs, rubs, or gallops.  ABDOMEN: Soft, non-tender, non-distended. Bowel sounds present. No organomegaly or mass.  EXTREMITIES: No pedal edema, cyanosis, or clubbing.  NEUROLOGIC: Cranial nerves II through XII are intact.  Muscle strength 5/5 in all extremities. Sensation intact. Gait not checked.  PSYCHIATRIC: The patient is alert and oriented x 3.  SKIN:  skin tear left lower extremity  DATA REVIEW:   CBC Recent Labs  Lab 05/27/17 0447  WBC 12.0*  HGB 9.4*  HCT 28.3*  PLT 489*    Chemistries  Recent Labs  Lab 05/27/17 0447  NA 139  K 3.7  CL 110  CO2 20*  GLUCOSE 89  BUN 46*  CREATININE 1.15*  CALCIUM 7.9*    Cardiac Enzymes Recent Labs  Lab 05/26/17 1046  TROPONINI 0.06*    Microbiology Results  Results for orders placed or performed during the hospital encounter of 05/26/17  Blood culture (routine x 2)     Status: None (Preliminary result)   Collection Time: 05/26/17 11:52 AM  Result Value Ref Range Status   Specimen Description BLOOD LEFT ANTECUBITAL  Final   Special Requests  Final    BOTTLES DRAWN AEROBIC AND ANAEROBIC Blood Culture adequate volume   Culture   Final    NO GROWTH 4 DAYS Performed at [facility], [address] [place], [place] [address]    Report Status PENDING  Incomplete  Blood culture (routine x 2)     Status: None (Preliminary result)   Collection Time: 05/26/17 12:21 PM  Result Value Ref Range Status   Specimen Description BLOOD RIGHT ANTECUBITAL  Final   Special Requests   Final    BOTTLES DRAWN AEROBIC AND ANAEROBIC Blood Culture adequate volume   Culture   Final    NO GROWTH 4 DAYS Performed at [facility], [address], [place], [place] [address]    Report Status PENDING  Incomplete  MRSA PCR Screening     Status: None   Collection Time: 05/27/17 10:23 AM  Result Value Ref Range Status   MRSA by PCR NEGATIVE NEGATIVE Final    Comment:        The GeneXpert MRSA Assay (FDA approved for NASAL specimens only), is one component of a comprehensive MRSA colonization surveillance program. It is not intended to diagnose MRSA infection nor to guide or monitor treatment for MRSA infections. Performed at [facility], [address], [place], [place] [address]       Management plans discussed with the patient, family and they are in agreement.  CODE STATUS:     Code Status Orders  (From admission, onward)        Start     Ordered   05/26/17 1856  Do not attempt resuscitation (DNR)  Continuous    Question Answer Comment  In the event of cardiac or respiratory ARREST Do not call a "code blue"   In the event of cardiac or respiratory ARREST Do not perform Intubation, CPR, defibrillation or ACLS   In the event of cardiac or respiratory ARREST Use medication by any route, position, wound care, and other measures to relive pain and suffering. May use oxygen, suction and manual treatment of airway obstruction as needed for comfort.      05/26/17 1855    Code Status History    Date Active Date Inactive Code Status Order ID Comments User Context   05/12/2017 0052 05/12/2017 2138 Full Code [id number]  Barbaraann Rondo, MD Inpatient   04/25/2012 1615 05/05/2012 1440 DNR [id number]  Charlton Amor, PA-C Inpatient   04/22/2012 0847 04/25/2012 1615 DNR [id number]  Pamella Pert, MD Inpatient    Advance Directive Documentation     Most Recent Value  Type of Advance Directive  Healthcare Power of Attorney, Living will  Pre-existing out of facility DNR order (yellow form or pink MOST form)  -  "MOST" Form in Place?  -      TOTAL TIME TAKING CARE OF THIS PATIENT: 35 minutes.    Alford Highland M.D on 05/30/2017 at 11:47 AM  Between 7am to 6pm - Pager - [phone]  After 6pm go to [email or web address] - password [email or web address]  [facility] Office  [phone]  CC: Primary care physician; Olive Bass, MD

## 2017-05-30 NOTE — Progress Notes (Signed)
New referral for [facility] services at [facility] received from [place]. Patient is a 84 year old woman with a known history of Afib on Eliquis, CVA, HTN and arthritis admitted to [facility] on 5/9 with shortness of breath and cough, she was found to have pneumonia as well as decreased hemoglobin. She received IV antibiotics and a unit of PRBC's. After admission family and patient declined any further GI work up. Plan is for discharge back to [facility] with the support of hospice services.  Writer met in the room with patient,  her daughter Katharine Look and son in law to initiated education regarding hospice services, philosophy and team approach to care with good understanding voiced. Hospital bed had been requested for delivery as soon as possible. Patient will require EMS transport. Signed DNR in place in discharge packet. Hospital care team updated. Discharge summary faxed to referral. Flo Shanks RN, BSN, [facility], hospital liaison [phone]

## 2017-05-31 LAB — CULTURE, BLOOD (ROUTINE X 2)
CULTURE: NO GROWTH
Culture: NO GROWTH
Special Requests: ADEQUATE
Special Requests: ADEQUATE

## 2017-06-18 DEATH — deceased

## 2019-03-05 IMAGING — US US EXTREM LOW VENOUS BILAT
1 series · 13 of 24 positions shown · non-contrast
Comparison: None.

CLINICAL DATA: Bilateral lower extremity pain and difficulty
walking today.



[Series 1: us extrem low venous bilat · 0.05mm/px · 13 of 57 slices shown]
[im 1/57]
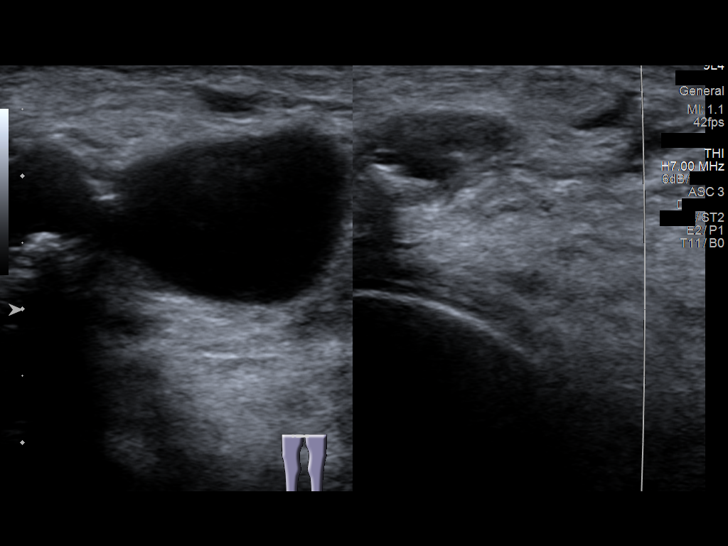
[im 5/57]
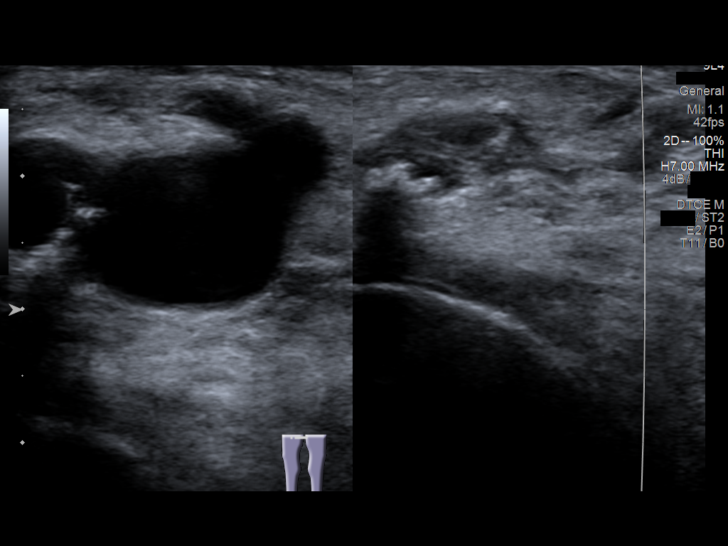
[im 10/57]
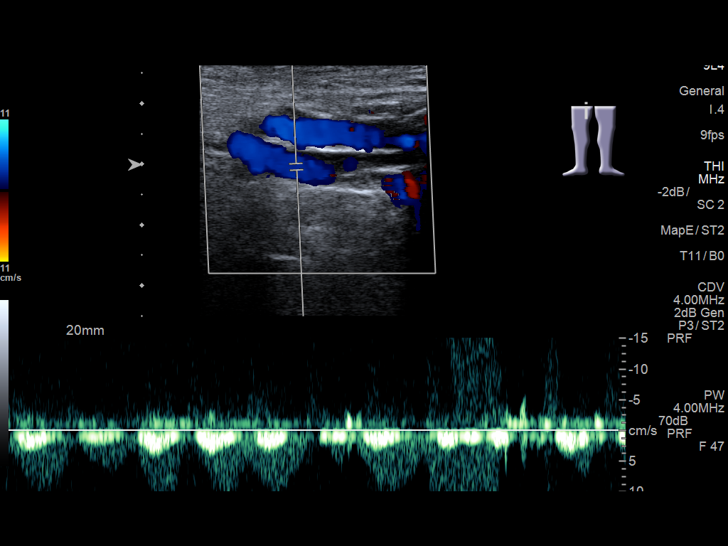
[im 15/57]
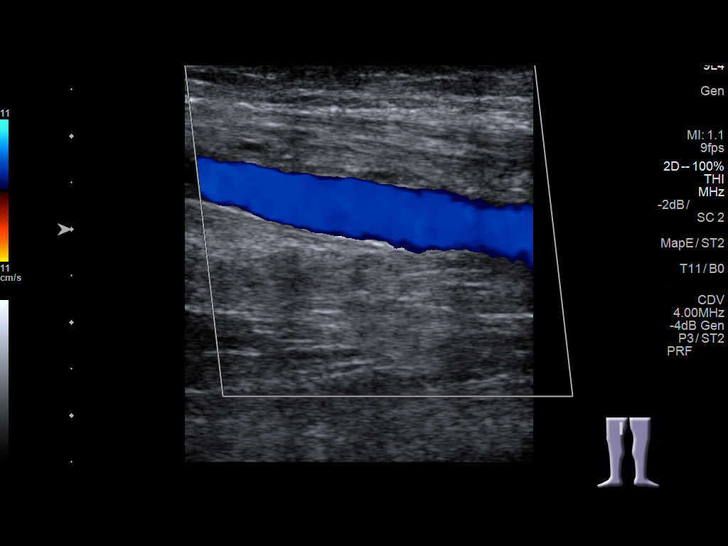
[im 20/57]
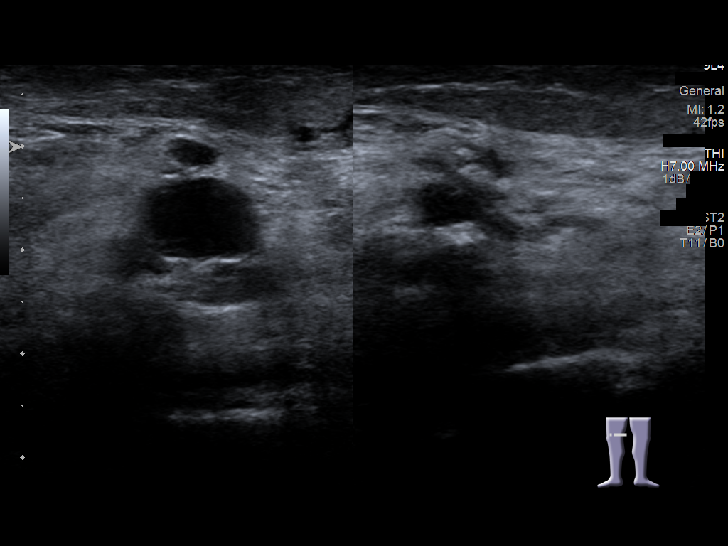
[im 25/57]
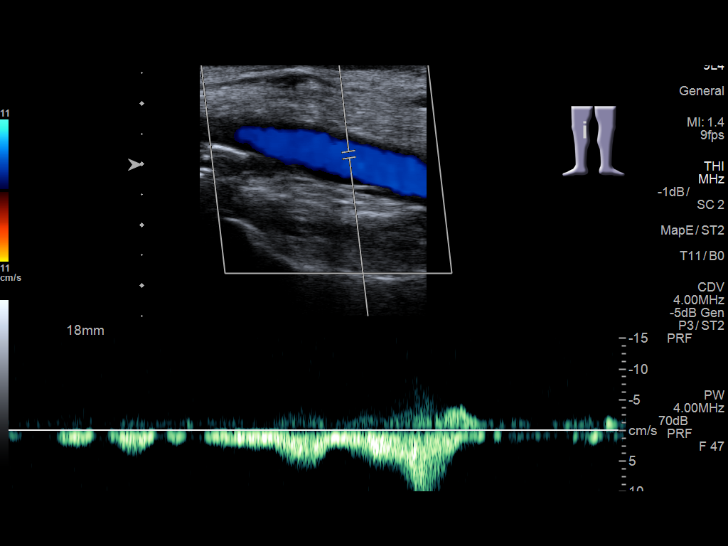
[im 30/57]
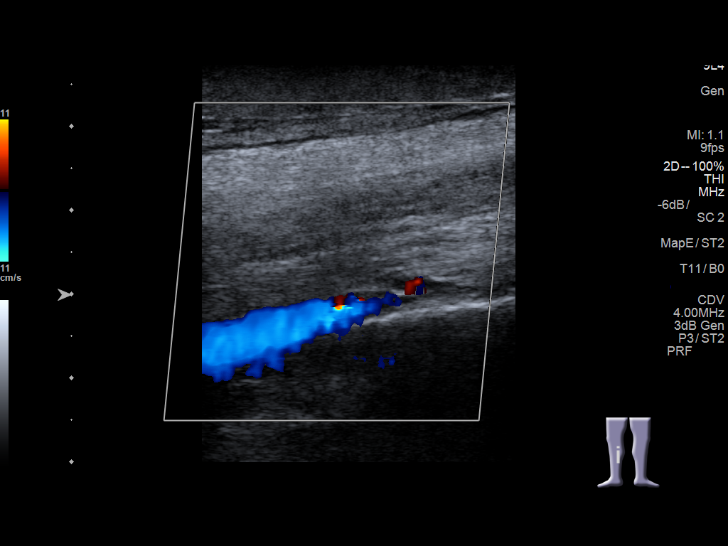
[im 32/57]
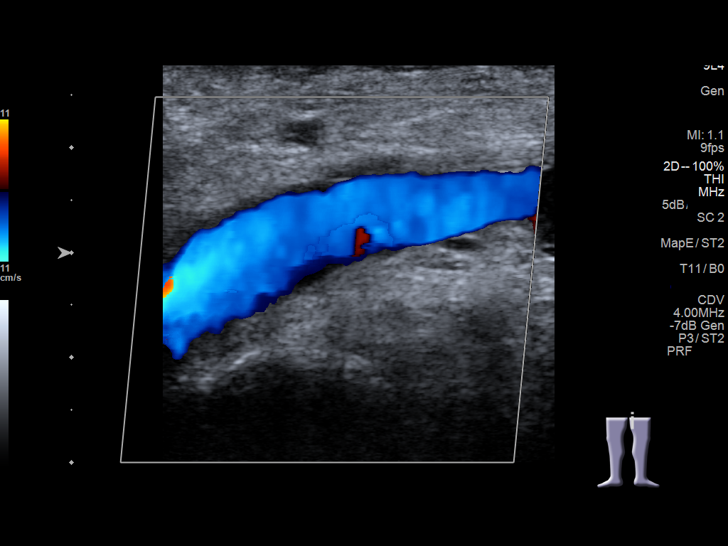
[im 37/57]
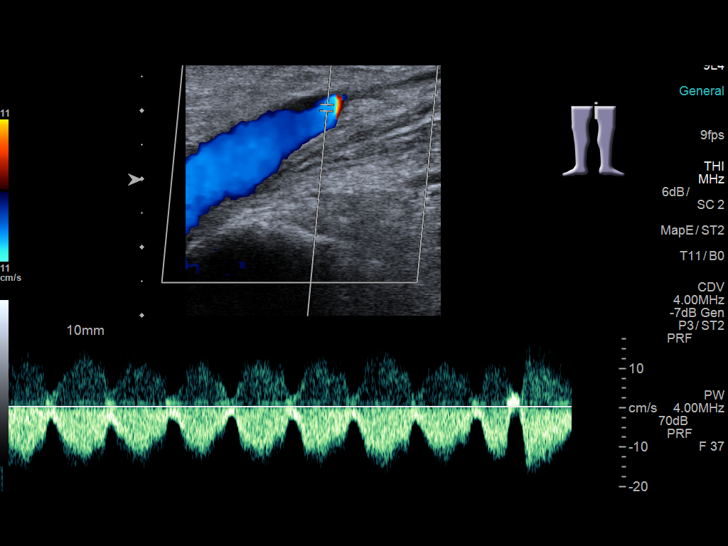
[im 42/57]
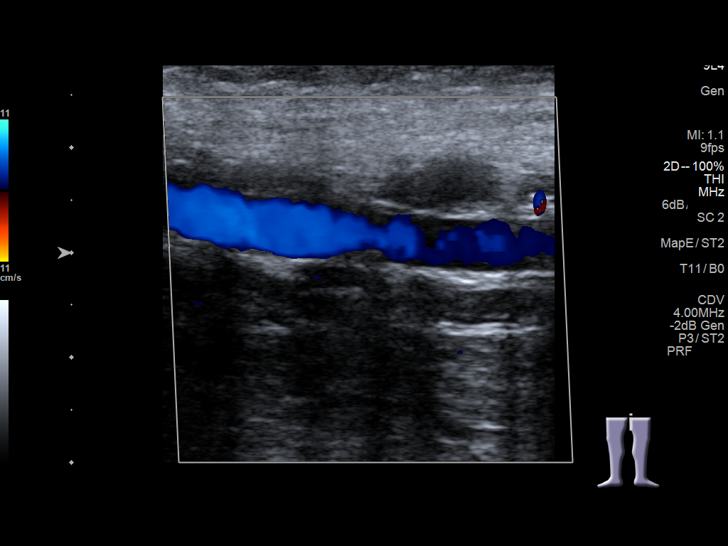
[im 47/57]
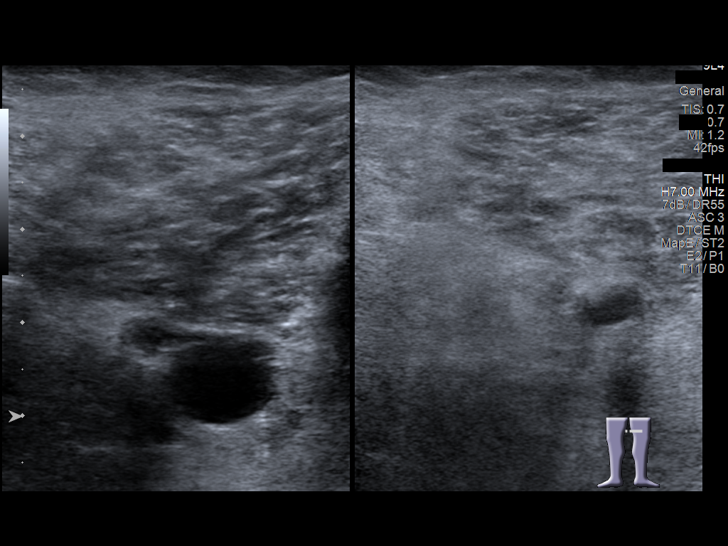
[im 52/57]
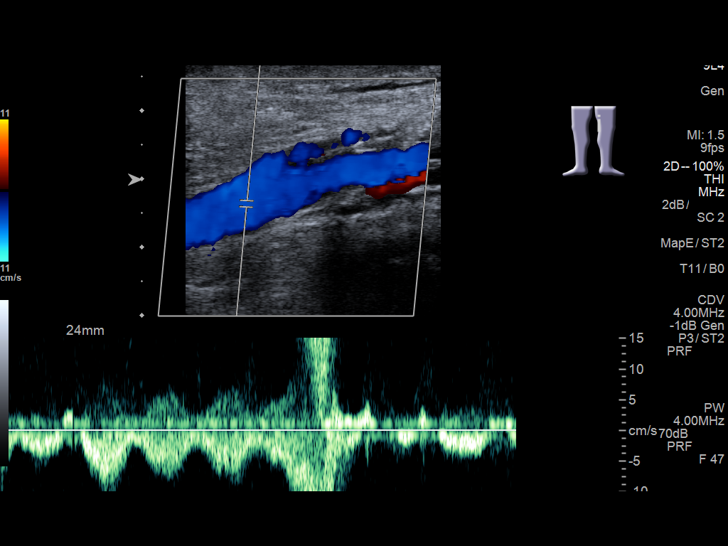
[im 57/57]
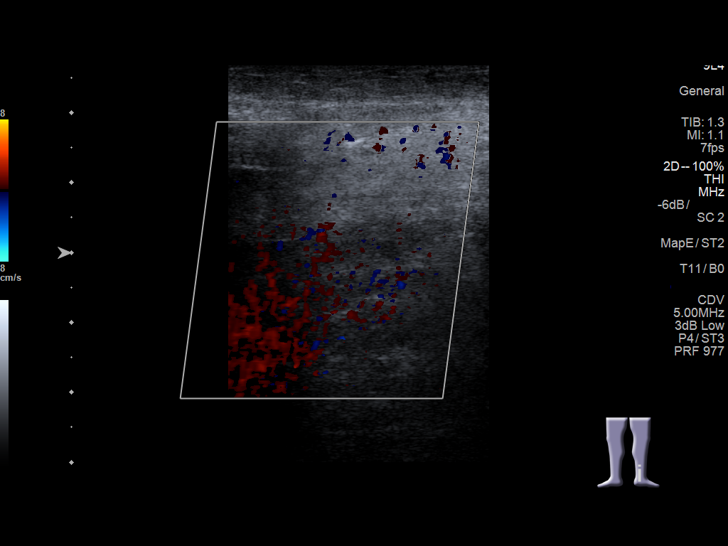

[13 of 24 positions shown; findings below may reference images not displayed]

FINDINGS: RIGHT LOWER EXTREMITY

Common Femoral Vein: No evidence of thrombus. Normal
compressibility, respiratory phasicity and response to augmentation.

Saphenofemoral Junction: No evidence of thrombus. Normal
compressibility and flow on color Doppler imaging.

Profunda Femoral Vein: No evidence of thrombus. Normal
compressibility and flow on color Doppler imaging.

Femoral Vein: No evidence of thrombus. Normal compressibility,
respiratory phasicity and response to augmentation.

Popliteal Vein: No evidence of thrombus. Normal compressibility,
respiratory phasicity and response to augmentation.

Calf Veins: No evidence of thrombus. Mild tracking subcutaneous soft
tissue edema. Normal compressibility and flow on color Doppler
imaging.

Superficial Great Saphenous Vein: No evidence of thrombus. Normal
compressibility.

Venous Reflux:  None.

Other Findings:  None.

LEFT LOWER EXTREMITY

Common Femoral Vein: No evidence of thrombus. Normal
compressibility, respiratory phasicity and response to augmentation.

Saphenofemoral Junction: No evidence of thrombus. Normal
compressibility and flow on color Doppler imaging.

Profunda Femoral Vein: No evidence of thrombus. Normal
compressibility and flow on color Doppler imaging.

Femoral Vein: No evidence of thrombus. Normal compressibility,
respiratory phasicity and response to augmentation.

Popliteal Vein: No evidence of thrombus. Normal compressibility,
respiratory phasicity and response to augmentation.

Calf Veins: Limited assessment due to subcutaneous soft tissue edema
in the calf.

Superficial Great Saphenous Vein: No evidence of thrombus. Normal
compressibility.

Venous Reflux:  None.

Other Findings:  None.
IMPRESSION: No evidence of deep venous thrombosis. Subcutaneous soft tissue
edema involving the left calf limits assessment of the calf veins.

## 2019-04-20 IMAGING — CT CT HEAD W/O CM
4 of 5 series · 15 of 47 positions shown, 17 images · non-contrast
Comparison: CT and MR exams from 04/21/2012

CLINICAL DATA: Stroke-like symptoms with right-sided facial droop
and leaning to the right.

EXAM:
CT HEAD WITHOUT CONTRAST
TECHNIQUE: Contiguous axial images were obtained from the base of the skull
through the vertex without intravenous contrast.

[Series 2: head wo · axial · 0.42mm/px · z∈[-143,-38]mm · 6 of 31 slices shown, 8 images (1 of 2)]
[im 5/31  brain]
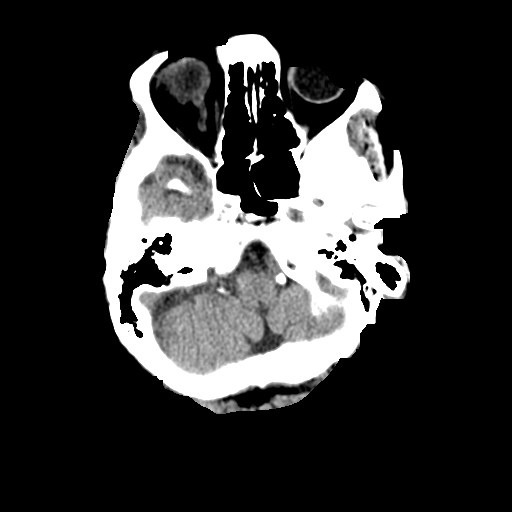
[im 5/31  bone]
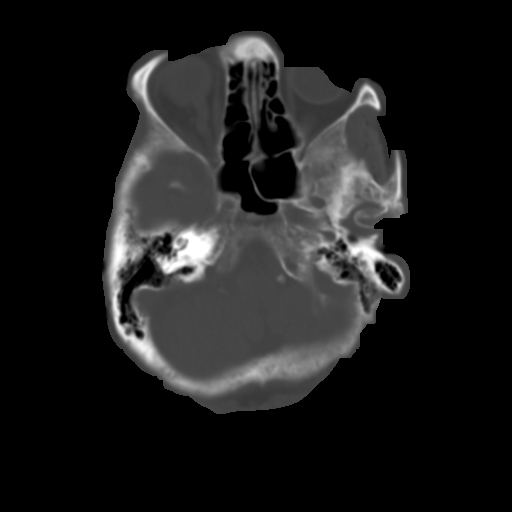
[im 9/31  brain]
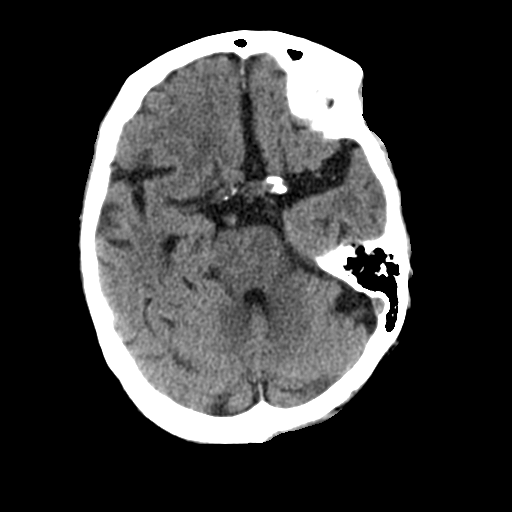
[im 13/31  brain]
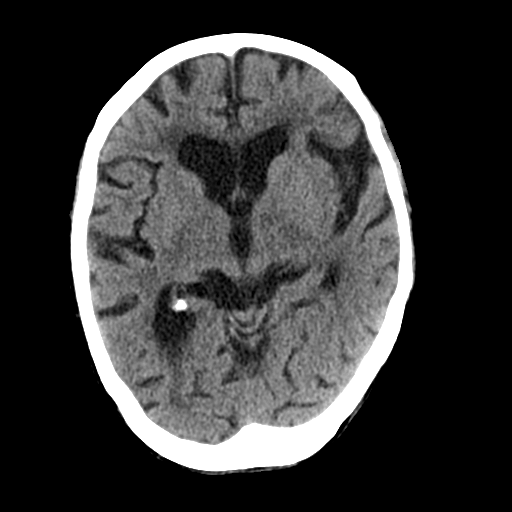
[im 18/31  brain]
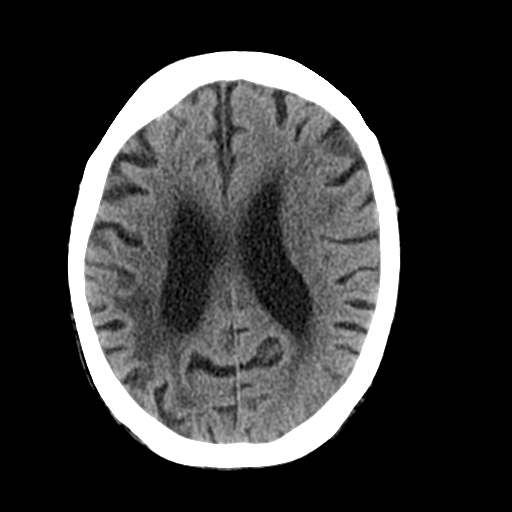
[im 22/31  brain]
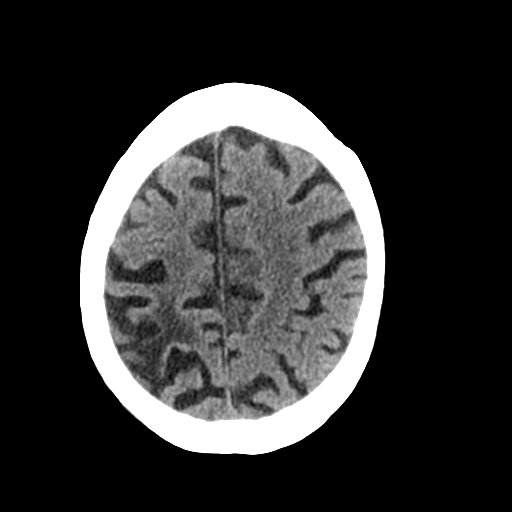
[im 22/31  bone]
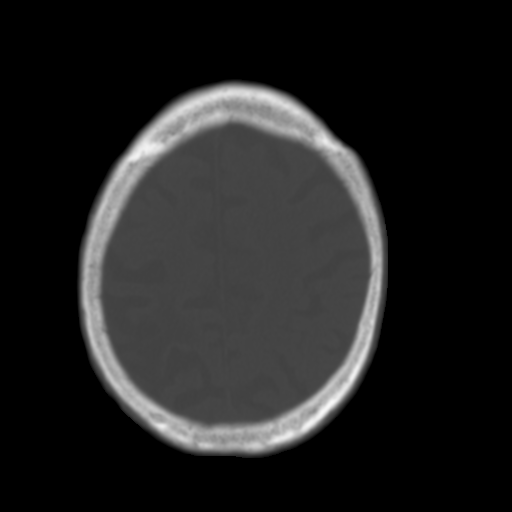
[im 26/31  brain]
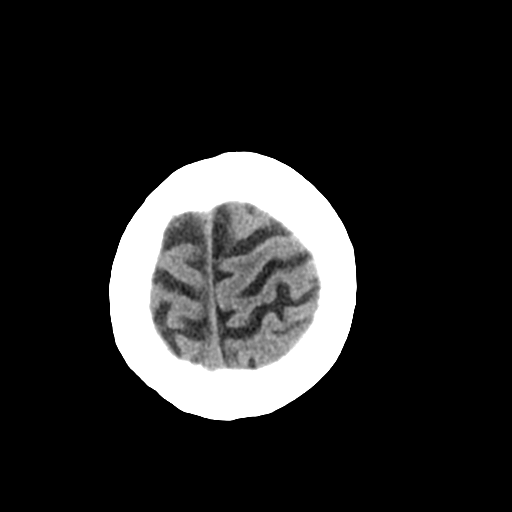

[Series 4: head wo · axial · 0.30mm/px · z∈[-123,-76]mm · 3 of 26 slices shown (2 of 2)]
[im 6/26  brain]
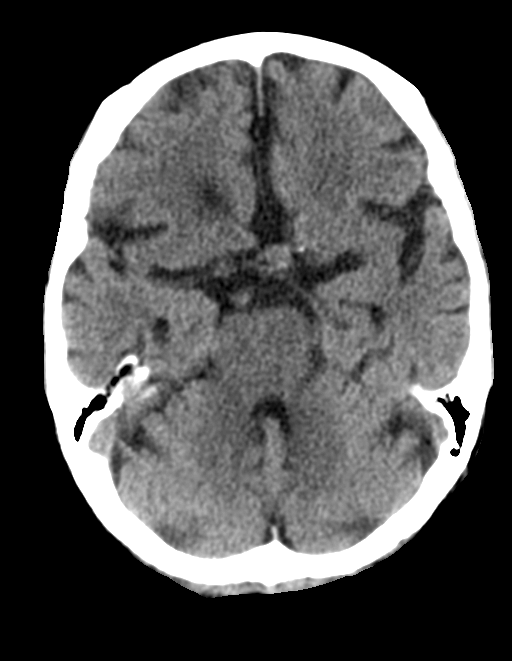
[im 11/26  brain]
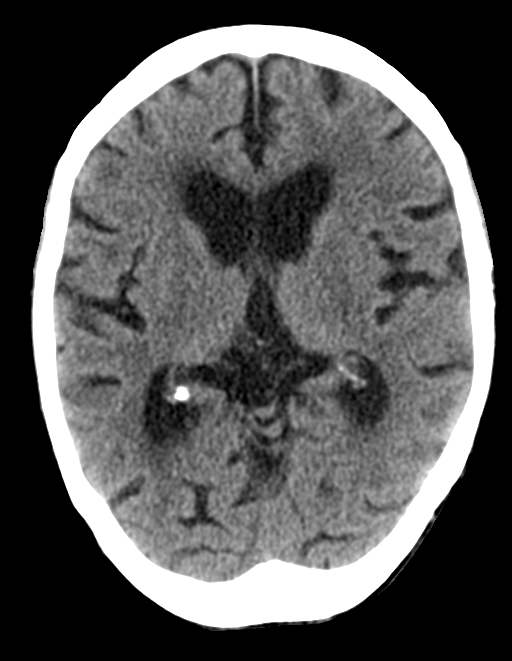
[im 16/26  brain]
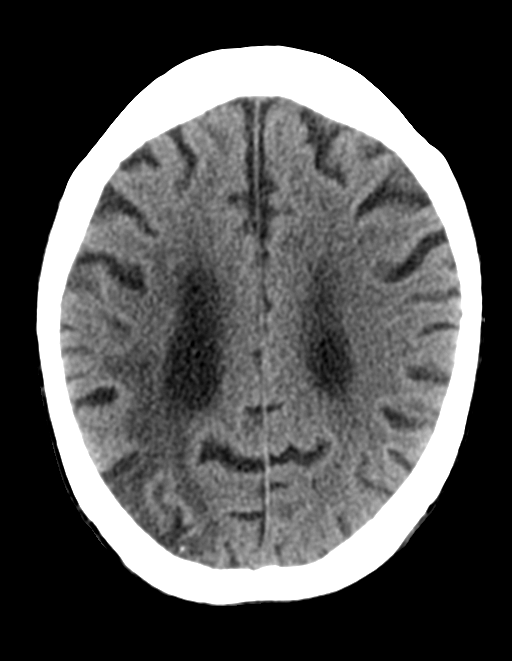

[Series 6: coronal soft tissue · coronal · 0.30mm/px · 3 of 61 slices shown]
[im 21/61  brain]
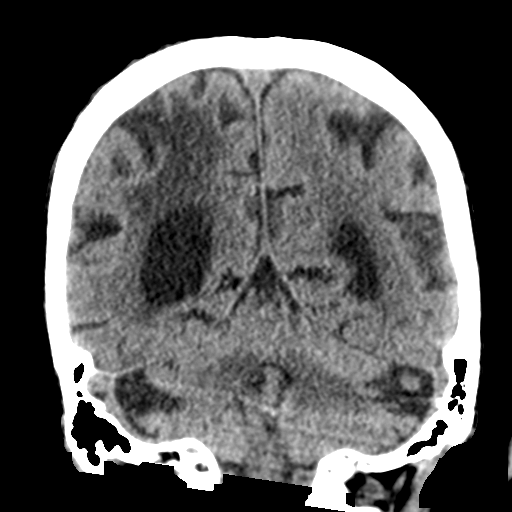
[im 27/61  brain]
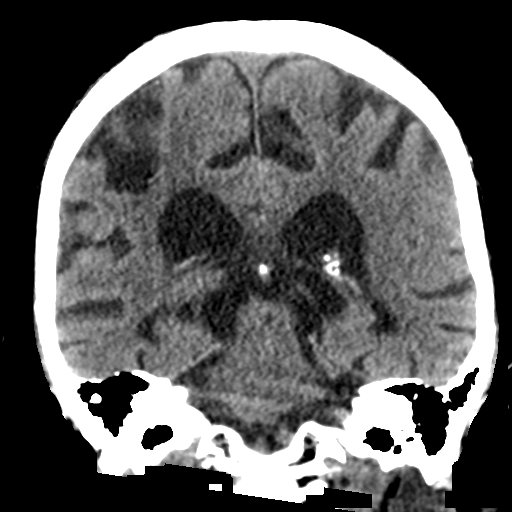
[im 34/61  brain]
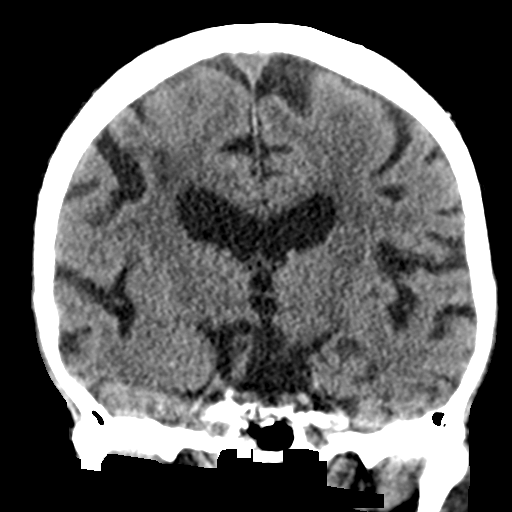

[Series 7: sagittal soft tissue · sagittal · 0.29mm/px · 3 of 48 slices shown]
[im 17/48  brain]
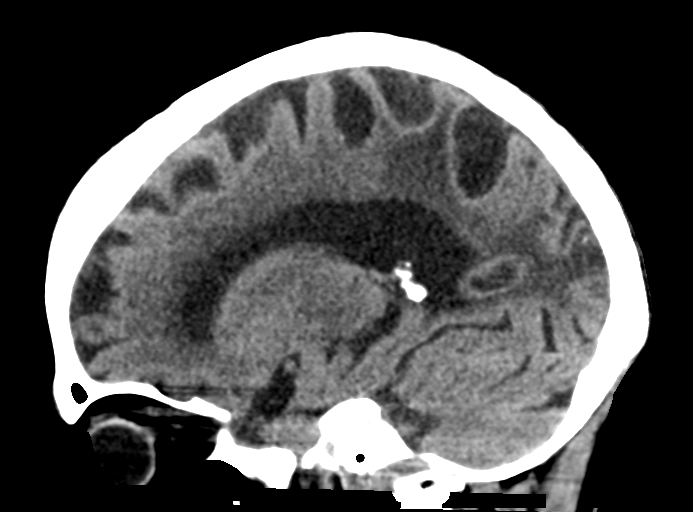
[im 24/48  brain]
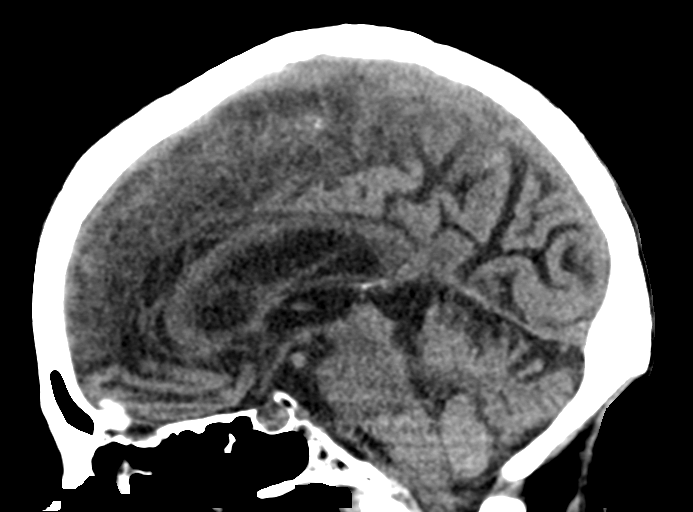
[im 31/48  brain]
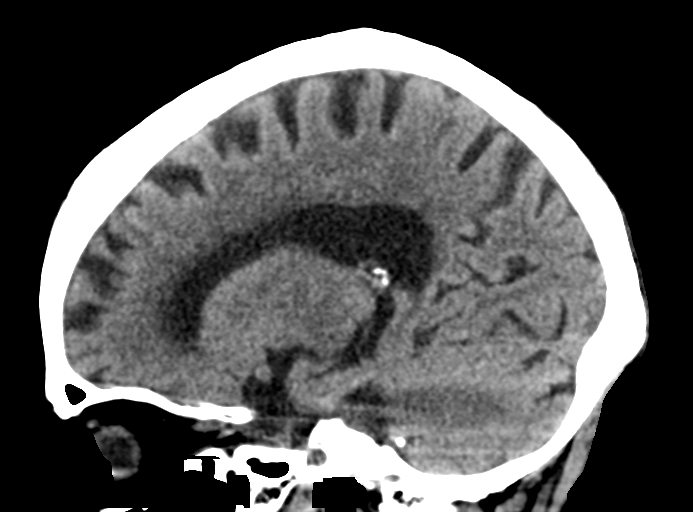

[15 of 47 positions shown; findings below may reference images not displayed]

FINDINGS: Brain: Remote right posterior parietal lobe infarct with mild ex
vacuo dilatation of the adjacent right lateral ventricle. Chronic
small vessel ischemic disease noted of the periventricular white
matter. No acute intracranial hemorrhage, midline shift or edema. No
extra-axial fluid collections.

Vascular: Atherosclerosis of the carotid siphons bilaterally.
Hyperdense vessel sign.

Skull: Negative for fracture or focal lesion.

Sinuses/Orbits: No acute finding.

Other: None
IMPRESSION: Remote right posterior parietal lobe infarct with mild ex vacuo
dilatation of the adjacent right lateral ventricle and mild
encephalomalacia. No acute intracranial abnormality.

## 2019-04-21 IMAGING — DX DG CHEST 1V
1 series · 1 of 1 positions shown · non-contrast
Comparison: 04/21/2012.

CLINICAL DATA: Assisted-living.  Clearance for TB.

EXAM:
CHEST  1 VIEW

[chest ap]
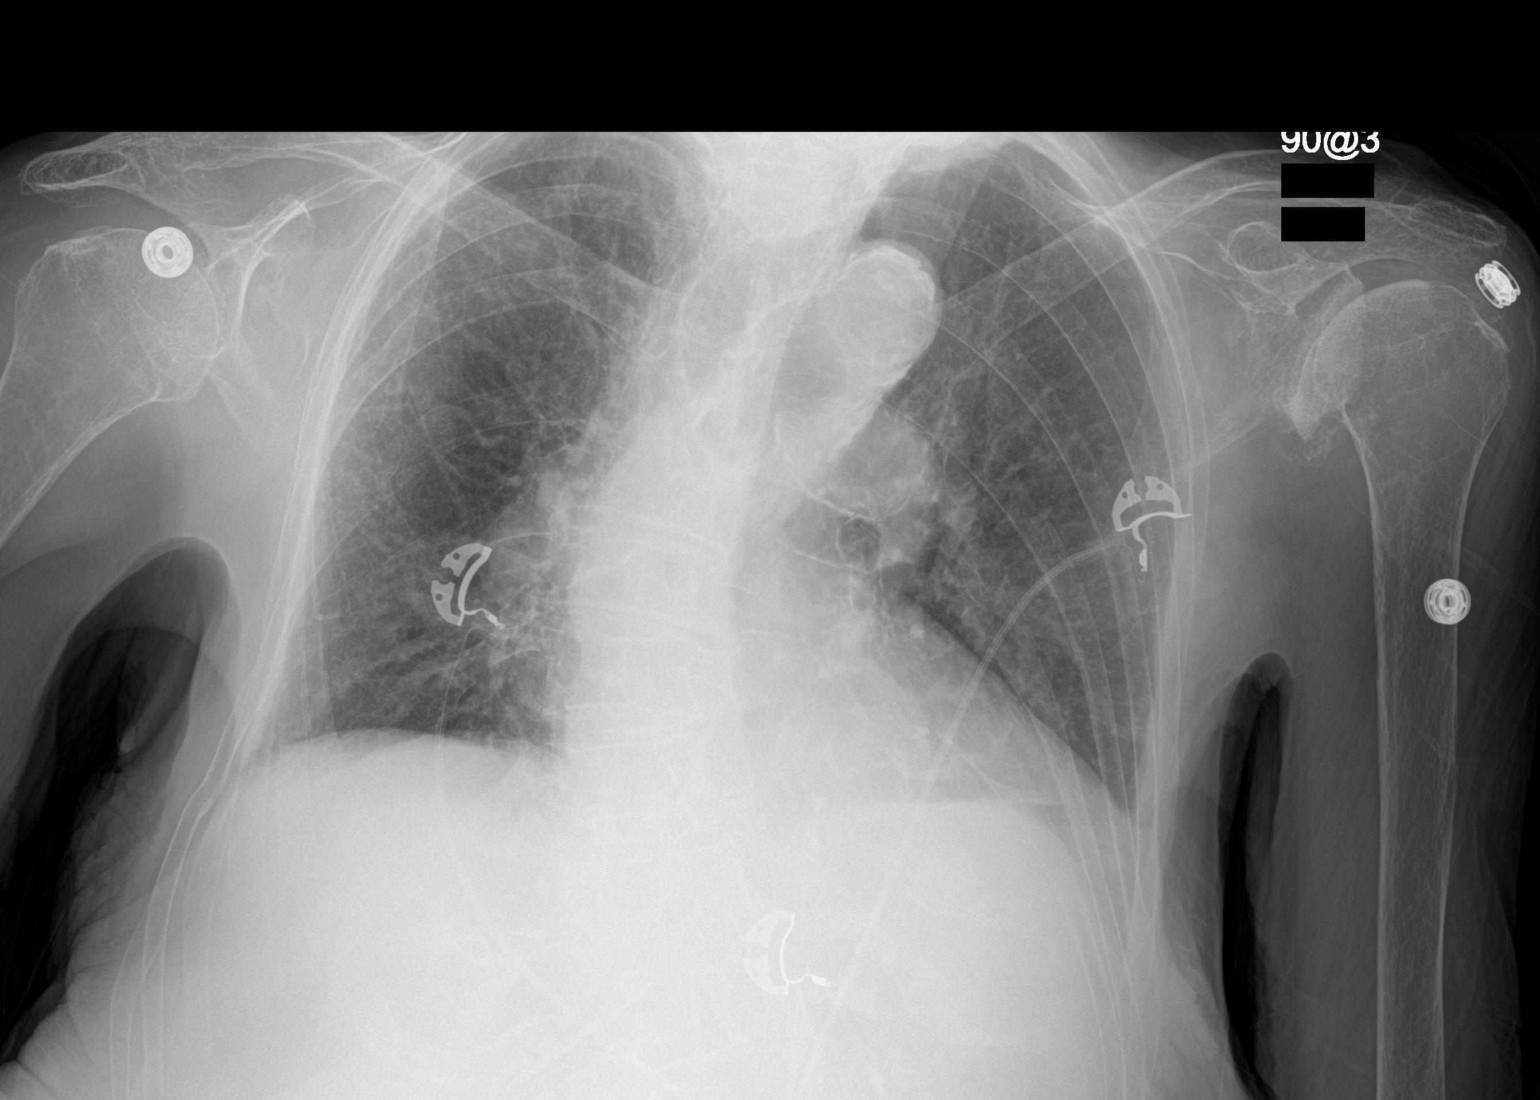

[1 of 1 positions shown; findings below may reference images not displayed]

FINDINGS: Mediastinum and hilar structures normal. Cardiomegaly with normal
pulmonary vascularity. Mild left base subsegmental atelectasis. Mild
left base pleural thickening most likely scarring. No prominent
pleural effusion. No pneumothorax. Degenerative change thoracic
spine. Scoliosis thoracic spine.
IMPRESSION: Mild basilar atelectasis and pleuroparenchymal scarring. No acute
abnormality. No evidence of TB.
